# Patient Record
Sex: Female | Born: 1945 | Race: Black or African American | Hispanic: No | Smoking: Former smoker
Health system: Southern US, Community
[De-identification: ages and names within clinical notes are randomized; demographics above are authoritative.]

## PROBLEM LIST (undated history)

## (undated) DIAGNOSIS — R059 Cough, unspecified: Secondary | ICD-10-CM

## (undated) DIAGNOSIS — I517 Cardiomegaly: Secondary | ICD-10-CM

## (undated) DIAGNOSIS — I10 Essential (primary) hypertension: Secondary | ICD-10-CM

## (undated) DIAGNOSIS — M25473 Effusion, unspecified ankle: Secondary | ICD-10-CM

## (undated) DIAGNOSIS — E119 Type 2 diabetes mellitus without complications: Secondary | ICD-10-CM

## (undated) DIAGNOSIS — Z8739 Personal history of other diseases of the musculoskeletal system and connective tissue: Secondary | ICD-10-CM

## (undated) DIAGNOSIS — J45909 Unspecified asthma, uncomplicated: Secondary | ICD-10-CM

## (undated) DIAGNOSIS — I509 Heart failure, unspecified: Secondary | ICD-10-CM

## (undated) DIAGNOSIS — R0602 Shortness of breath: Secondary | ICD-10-CM

## (undated) DIAGNOSIS — R0600 Dyspnea, unspecified: Secondary | ICD-10-CM

## (undated) HISTORY — DX: Cough, unspecified: R05.9

## (undated) HISTORY — DX: Effusion, unspecified ankle: M25.473

## (undated) HISTORY — DX: Personal history of other diseases of the musculoskeletal system and connective tissue: Z87.39

## (undated) HISTORY — DX: Type 2 diabetes mellitus without complications: E11.9

## (undated) HISTORY — DX: Unspecified asthma, uncomplicated: J45.909

## (undated) HISTORY — DX: Shortness of breath: R06.02

## (undated) HISTORY — DX: Cardiomegaly: I51.7

## (undated) HISTORY — DX: Dyspnea, unspecified: R06.00

## (undated) HISTORY — DX: Essential (primary) hypertension: I10

## (undated) HISTORY — DX: Heart failure, unspecified: I50.9

---

## 2020-04-11 DIAGNOSIS — J0100 Acute maxillary sinusitis, unspecified: Secondary | ICD-10-CM | POA: Diagnosis not present

## 2020-04-11 DIAGNOSIS — I10 Essential (primary) hypertension: Secondary | ICD-10-CM | POA: Diagnosis not present

## 2020-04-16 ENCOUNTER — Encounter (HOSPITAL_COMMUNITY): Payer: Self-pay | Admitting: Emergency Medicine

## 2020-04-16 ENCOUNTER — Other Ambulatory Visit: Payer: Self-pay

## 2020-04-16 ENCOUNTER — Emergency Department (HOSPITAL_COMMUNITY)
Admission: EM | Admit: 2020-04-16 | Discharge: 2020-04-16 | Disposition: A | Discharge status: Home / Self Care | Payer: Medicare Other | Attending: Emergency Medicine | Admitting: Emergency Medicine

## 2020-04-16 ENCOUNTER — Emergency Department (HOSPITAL_COMMUNITY): Payer: Medicare Other

## 2020-04-16 DIAGNOSIS — E876 Hypokalemia: Secondary | ICD-10-CM | POA: Insufficient documentation

## 2020-04-16 DIAGNOSIS — R059 Cough, unspecified: Secondary | ICD-10-CM | POA: Diagnosis not present

## 2020-04-16 DIAGNOSIS — R2243 Localized swelling, mass and lump, lower limb, bilateral: Secondary | ICD-10-CM | POA: Diagnosis not present

## 2020-04-16 DIAGNOSIS — J45909 Unspecified asthma, uncomplicated: Secondary | ICD-10-CM | POA: Insufficient documentation

## 2020-04-16 DIAGNOSIS — R0602 Shortness of breath: Secondary | ICD-10-CM | POA: Insufficient documentation

## 2020-04-16 DIAGNOSIS — I10 Essential (primary) hypertension: Secondary | ICD-10-CM | POA: Diagnosis not present

## 2020-04-16 DIAGNOSIS — R002 Palpitations: Secondary | ICD-10-CM | POA: Diagnosis not present

## 2020-04-16 LAB — CBC
HCT: 41.4 % (ref 36.0–46.0)
Hemoglobin: 13.4 g/dL (ref 12.0–15.0)
MCH: 28.2 pg (ref 26.0–34.0)
MCHC: 32.4 g/dL (ref 30.0–36.0)
MCV: 87.2 fL (ref 80.0–100.0)
Platelets: 233 10*3/uL (ref 150–400)
RBC: 4.75 MIL/uL (ref 3.87–5.11)
RDW: 15.8 % — ABNORMAL HIGH (ref 11.5–15.5)
WBC: 7.7 10*3/uL (ref 4.0–10.5)
nRBC: 0 % (ref 0.0–0.2)

## 2020-04-16 LAB — BASIC METABOLIC PANEL
Anion gap: 13 (ref 5–15)
BUN: 15 mg/dL (ref 8–23)
CO2: 23 mmol/L (ref 22–32)
Calcium: 9.9 mg/dL (ref 8.9–10.3)
Chloride: 103 mmol/L (ref 98–111)
Creatinine, Ser: 1.01 mg/dL — ABNORMAL HIGH (ref 0.44–1.00)
GFR, Estimated: 58 mL/min — ABNORMAL LOW (ref >60)
Glucose, Bld: 168 mg/dL — ABNORMAL HIGH (ref 70–99)
Potassium: 3.3 mmol/L — ABNORMAL LOW (ref 3.5–5.1)
Sodium: 139 mmol/L (ref 135–145)

## 2020-04-16 LAB — TROPONIN I (HIGH SENSITIVITY)
Troponin I (High Sensitivity): <2 ng/L (ref <18)
Troponin I (High Sensitivity): <2 ng/L (ref <18)

## 2020-04-16 LAB — MAGNESIUM: Magnesium: 1.9 mg/dL (ref 1.7–2.4)

## 2020-04-16 IMAGING — CR DG CHEST 2V
2 series · 2 of 2 positions shown · non-contrast
Comparison: None.

CLINICAL DATA: Shortness of breath.  History of asthma.

EXAM:
CHEST - 2 VIEW

[chest pa]
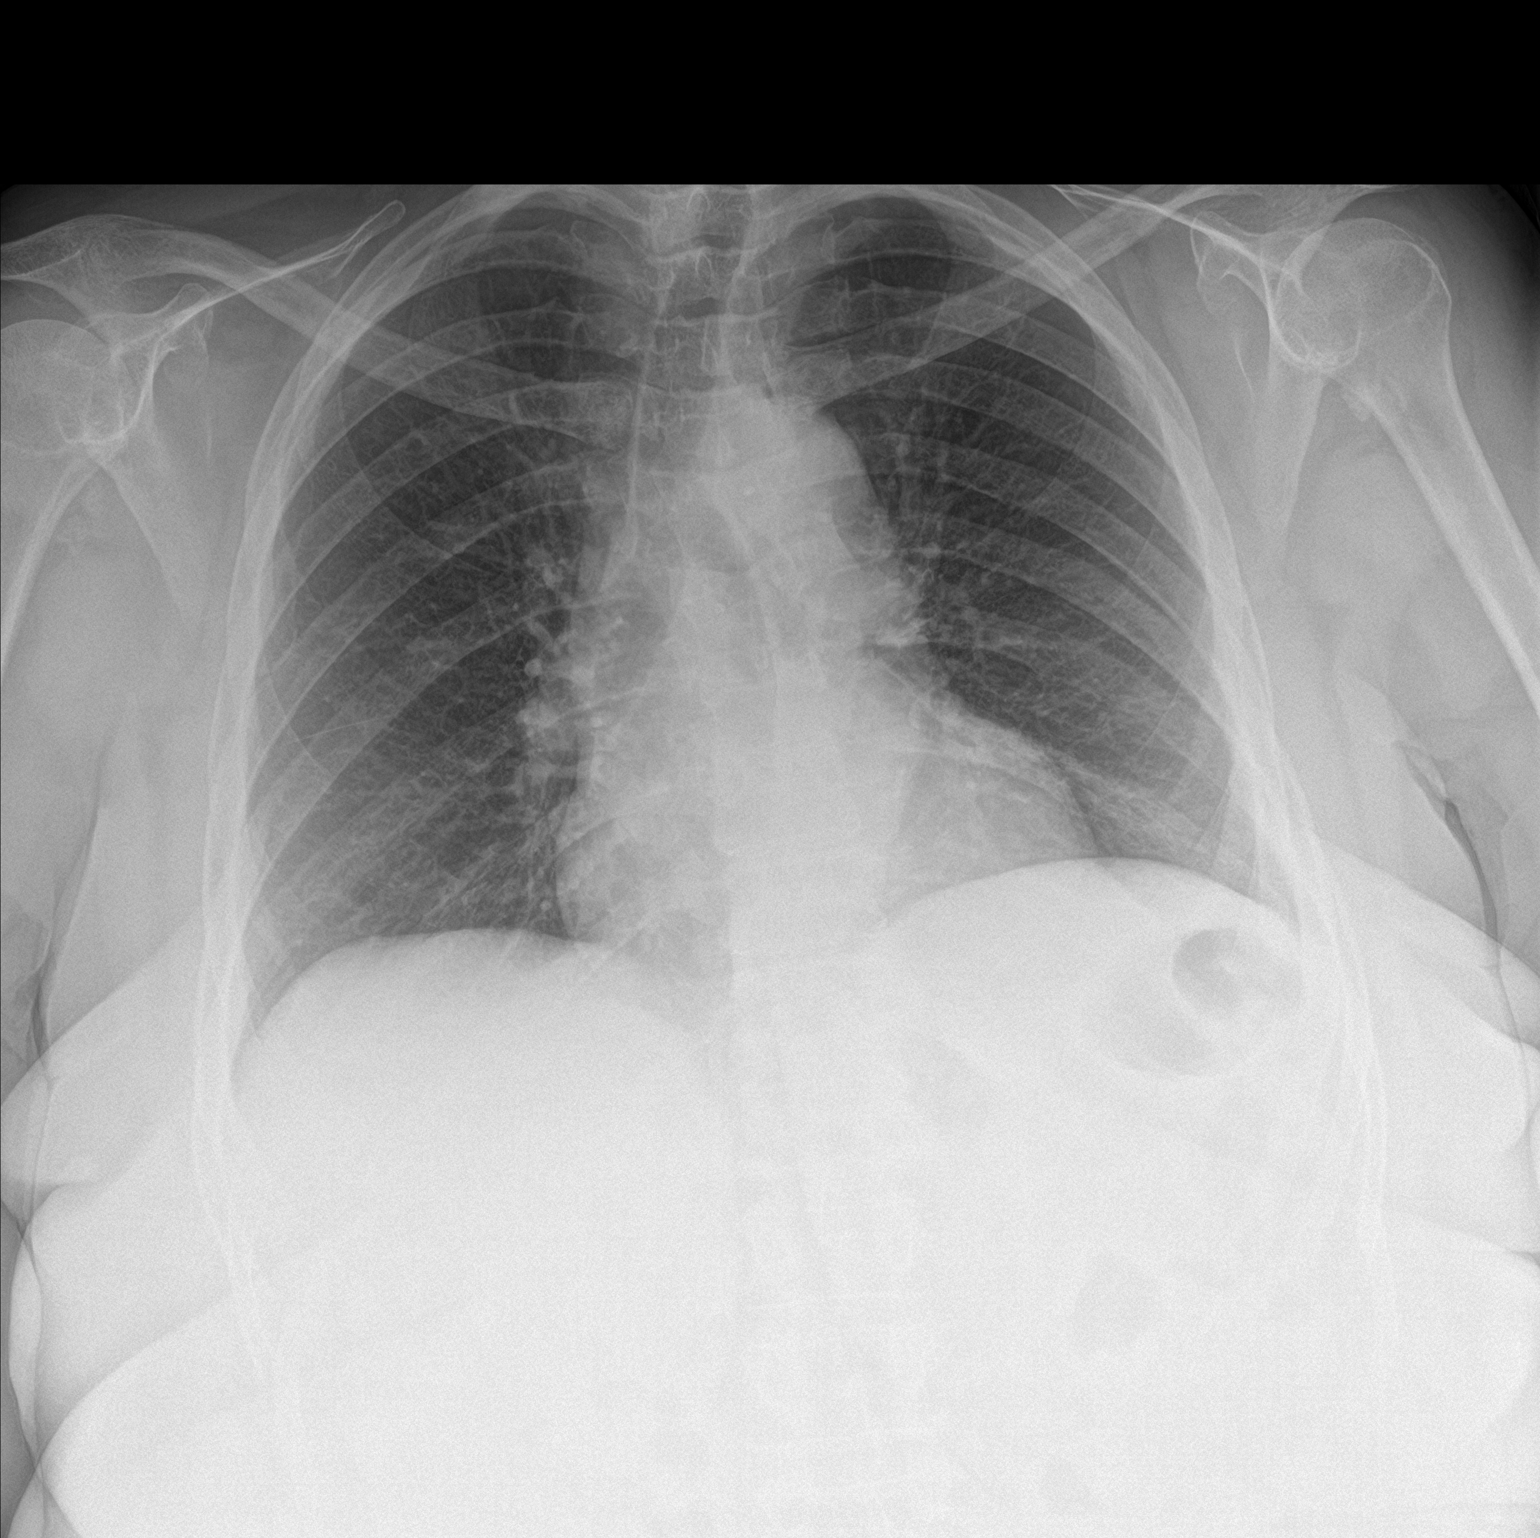

[chest lat]
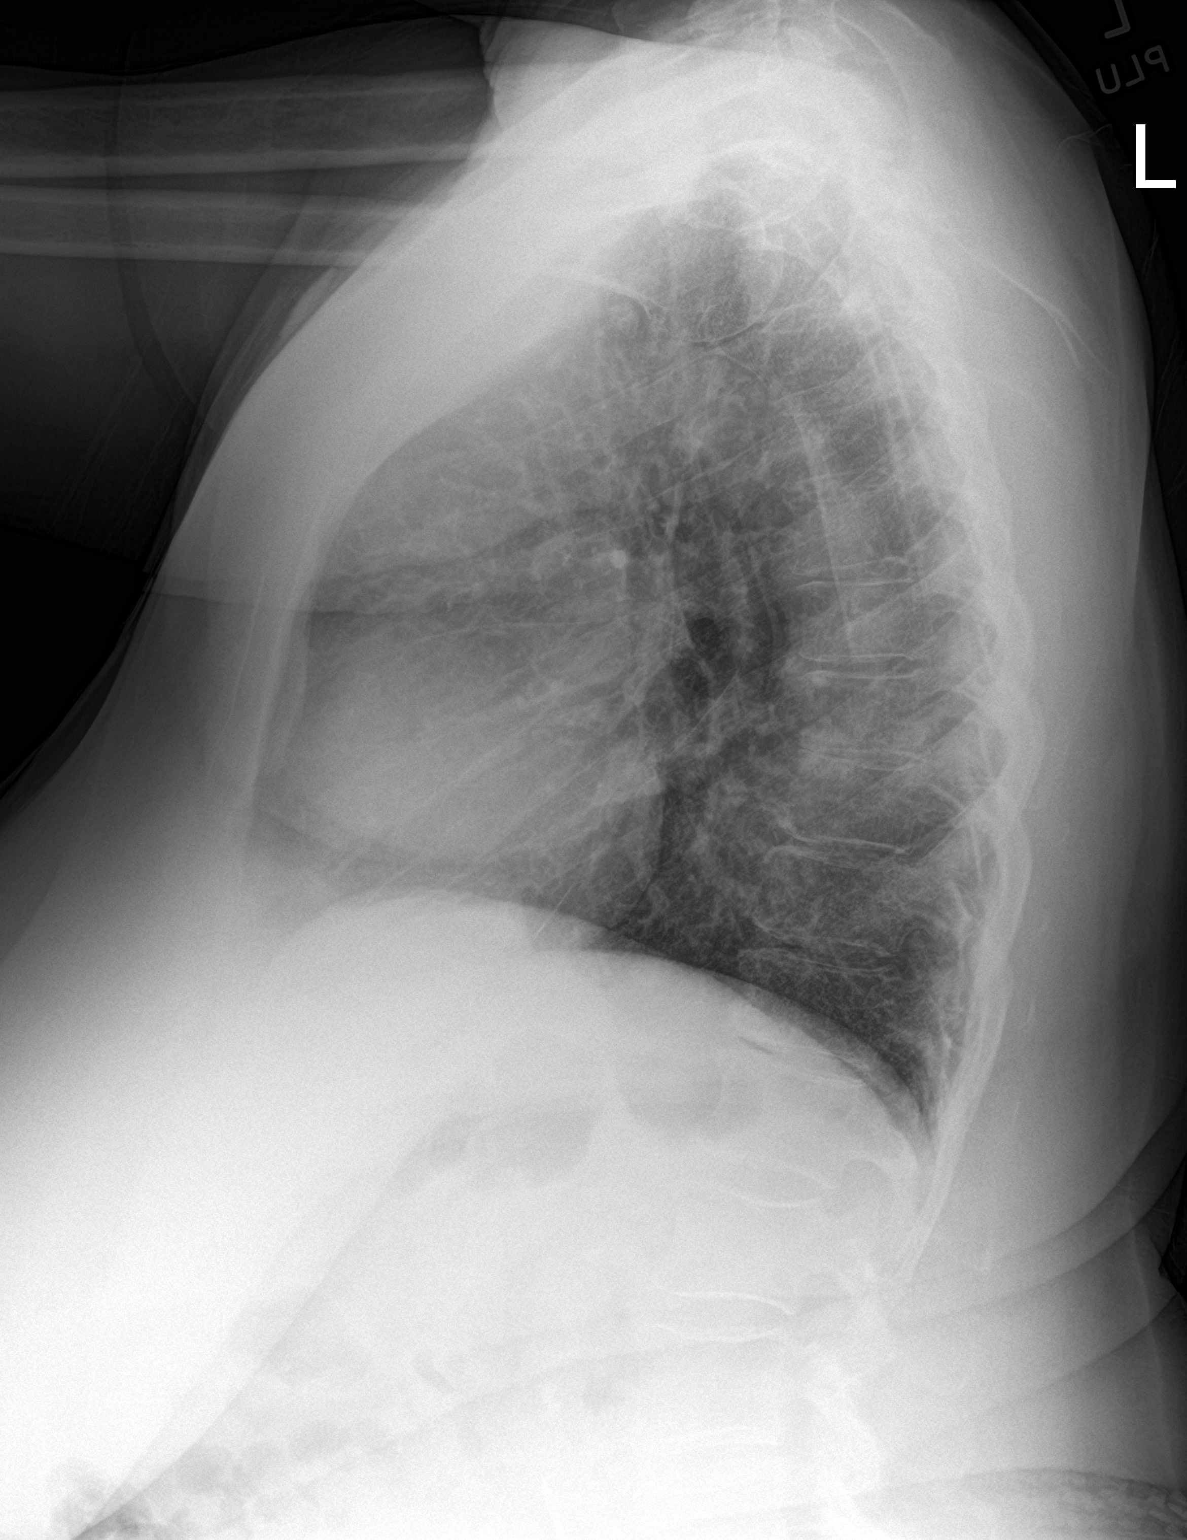

[2 of 2 positions shown; findings below may reference images not displayed]

FINDINGS: Borderline enlarged cardiac silhouette and mediastinal contours. The
thoracic aorta appears mildly tortuous and potentially slightly
ectatic with mild rightward deviation of the tracheal air column at
the level of the aortic arch. There is mild diffuse slightly nodular
thickening of the pulmonary interstitium. No discrete focal airspace
opacities. No pleural effusion or pneumothorax. No evidence of
edema. No acute osseous abnormalities.
IMPRESSION: Findings suggestive of airways disease / bronchitis. No focal
airspace opacities to suggest pneumonia.

## 2020-04-16 MED ORDER — FUROSEMIDE 10 MG/ML IJ SOLN
20.0000 mg | Freq: Once | INTRAMUSCULAR | Status: AC
  Administered 2020-04-16: 20 mg via INTRAVENOUS
  Filled 2020-04-16: qty 2

## 2020-04-16 MED ORDER — POTASSIUM CHLORIDE CRYS ER 20 MEQ PO TBCR
40.0000 meq | EXTENDED_RELEASE_TABLET | Freq: Once | ORAL | Status: AC
  Administered 2020-04-16: 40 meq via ORAL
  Filled 2020-04-16: qty 2

## 2020-04-16 MED ORDER — FUROSEMIDE 20 MG PO TABS: 20.0000 mg | ORAL_TABLET | Freq: Every day | ORAL | 0 refills | Status: DC

## 2020-04-16 NOTE — ED Provider Notes (Signed)
[facility] EMERGENCY DEPARTMENT Provider Note   CSN: [id number] Arrival date & time: 04/16/20  0901     History Chief Complaint  Patient presents with  . Shortness of Breath    Donna Garner is a 74 y.o. female with past medical history significant for asthma, hypertension, borderline diabetes.  HPI Patient presents to emergency department today with chief complaint of progressively worsening shortness of breath x 3 days. Patient states she moved here from [place] x 1 month ago. She has not established care with cardiology or pcp here yet. She states last time she saw cardiology she was told the left side of her heart was big.  Patient states since symptom onset she has felt like her heart is beating fast and like it is going to beat out of her chest.  She states she has had this feeling in the past.  She has been using her albuterol inhaler at home without any symptom improvement.  She admits to hearing wheezing and some chest congestion.  Last use inhaler prior to arrival.  She states her shortness of breath is causing her to not be able to walk as far she usually does. She has productive cough with clear phlegm. She is out of breath with easy activities. She has noticed swelling in her feet and ankles. She has been eating out more lately since moving here, typically tries to watch her salt intake.    Denies history of CVA or MI, COPD, CHF, anemia.     History reviewed. No pertinent past medical history.  There are no problems to display for this patient.   History reviewed. No pertinent surgical history.   OB History   No obstetric history on file.     No family history on file.  Social History   Tobacco Use  . Smoking status: Not on file  Substance Use Topics  . Alcohol use: Not on file  . Drug use: Not on file    Home Medications Prior to Admission medications   Medication Sig Start Date End Date Taking? Authorizing Provider  furosemide  (LASIX) 20 MG tablet Take 1 tablet (20 mg total) by mouth daily for 5 days. 04/17/20 04/22/20  Shanon Ace, PA-C    Allergies    Patient has no allergy information on record.  Review of Systems   Review of Systems  All other systems are reviewed and are negative for acute change except as noted in the HPI.   Physical Exam Updated Vital Signs BP 125/65 (BP Location: Left Arm)   Pulse 66   Temp 98.8 F (37.1 C) (Oral)   Resp 18   Ht 5\' 7"  (1.702 m)   Wt 94.8 kg   SpO2 100%   BMI 32.73 kg/m   Physical Exam Vitals and nursing note reviewed.  Constitutional:      General: She is not in acute distress.    Appearance: She is not ill-appearing.  HENT:     Head: Normocephalic and atraumatic.     Right Ear: Tympanic membrane and external ear normal.     Left Ear: Tympanic membrane and external ear normal.     Nose: Nose normal.     Mouth/Throat:     Mouth: Mucous membranes are moist.     Pharynx: Oropharynx is clear.  Eyes:     General: No scleral icterus.       Right eye: No discharge.        Left eye: No  discharge.     Extraocular Movements: Extraocular movements intact.     Conjunctiva/sclera: Conjunctivae normal.     Pupils: Pupils are equal, round, and reactive to light.  Neck:     Vascular: No JVD.  Cardiovascular:     Rate and Rhythm: Normal rate and regular rhythm.     Pulses: Normal pulses.          Radial pulses are 2+ on the right side and 2+ on the left side.       Dorsalis pedis pulses are 2+ on the right side and 2+ on the left side.     Heart sounds: Normal heart sounds.  Pulmonary:     Comments: Lungs clear to auscultation in all fields. Symmetric chest rise. No wheezing, rales, or rhonchi. Oxygen saturation is 100% on room air. Abdominal:     Comments: Abdomen is soft, non-distended, and non-tender in all quadrants. No rigidity, no guarding. No peritoneal signs.  Musculoskeletal:        General: Normal range of motion.     Cervical back:  Normal range of motion.     Right lower leg: No edema.     Left lower leg: No edema.  Skin:    General: Skin is warm and dry.     Capillary Refill: Capillary refill takes less than 2 seconds.     Comments: Equal tactile temperature to all extremities  Neurological:     Mental Status: She is oriented to person, place, and time.     GCS: GCS eye subscore is 4. GCS verbal subscore is 5. GCS motor subscore is 6.     Comments: Fluent speech, no facial droop.  Psychiatric:        Behavior: Behavior normal.     ED Results / Procedures / Treatments   Labs (all labs ordered are listed, but only abnormal results are displayed) Labs Reviewed  BASIC METABOLIC PANEL - Abnormal; Notable for the following components:      Result Value   Potassium 3.3 (*)    Glucose, Bld 168 (*)    Creatinine, Ser 1.01 (*)    GFR, Estimated 58 (*)    All other components within normal limits  CBC - Abnormal; Notable for the following components:   RDW 15.8 (*)    All other components within normal limits  MAGNESIUM  TROPONIN I (HIGH SENSITIVITY)  TROPONIN I (HIGH SENSITIVITY)    EKG EKG Interpretation  Date/Time:  Monday April 16 2020 09:17:24 EST Ventricular Rate:  82 PR Interval:  150 QRS Duration: 92 QT Interval:  384 QTC Calculation: 448 R Axis:   -54 Text Interpretation: Normal sinus rhythm Left anterior fascicular block Abnormal ECG Confirmed by Margarita Grizzle [phone]) on 04/16/2020 11:09:13 AM   Radiology DG Chest 2 View  Result Date: 04/16/2020 CLINICAL DATA:  Shortness of breath.  History of asthma. EXAM: CHEST - 2 VIEW COMPARISON:  None. FINDINGS: Borderline enlarged cardiac silhouette and mediastinal contours. The thoracic aorta appears mildly tortuous and potentially slightly ectatic with mild rightward deviation of the tracheal air column at the level of the aortic arch. There is mild diffuse slightly nodular thickening of the pulmonary interstitium. No discrete focal airspace  opacities. No pleural effusion or pneumothorax. No evidence of edema. No acute osseous abnormalities. IMPRESSION: Findings suggestive of airways disease / bronchitis. No focal airspace opacities to suggest pneumonia. Electronically Signed   By: Simonne Come M.D.   On: 04/16/2020 09:59    Procedures Procedures (including  critical care time)  Medications Ordered in ED Medications  furosemide (LASIX) injection 20 mg (20 mg Intravenous Given 04/16/20 1154)  potassium chloride SA (KLOR-CON) CR tablet 40 mEq (40 mEq Oral Given 04/16/20 1155)    ED Course  I have reviewed the triage vital signs and the nursing notes.  Pertinent labs & imaging results that were available during my care of the patient were reviewed by me and considered in my medical decision making (see chart for details).    MDM Rules/Calculators/A&P                          History provided by patient with additional history obtained from chart review.    74 yo female presenting with shortness of breath. She is well appearing, in no acute distress.  She is afebrile, HDS.  On exam lungs are clear to auscultation in all fields.  She has normal work of breathing. Mild bilateral lower extremity edema 1 +. No calf tenderness.  I viewed pt's chest xray and it does not suggest acute infectious processes. Radiologist does comment on findings to suggest airway disease/bronchitis.  CBC without leukocytosis or anemia. BMP with mild hypokalemia at 3.3, no other significant electrolyte derangement.  Creatinine is 1.01, slightly bumped with a normal BUN, normal anion gap. Delta troponin undetectable. EKG without obvious ischemia.  Suspect there is component of CHF causing patient's symptoms.  Will give IV Lasix.    Reassessed patient after IV Lasix.  She has ambulated to the bathroom at least twice without any difficulty.  Her work of breathing has improved.  She ambulated without hypoxia.  Engaged in shared decision making with patient and  daughter at the bedside. They feel symptoms can be managed at home. Patient stable to be discharged home in ambulatory referral has been sent to cardiology outpatient has not yet been able to establish care since moving to [facility]. Will discharge with short course of PO lasix.  The patient appears reasonably screened and/or stabilized for discharge and I doubt any other medical condition or other [facility] requiring further screening, evaluation, or treatment in the ED at this time prior to discharge. The patient is safe for discharge with strict return precautions discussed. The patient was discussed with and seen by Dr. Rosalia Hammers who agrees with the treatment plan.  Portions of this note were generated with Scientist, clinical (histocompatibility and immunogenetics). Dictation errors may occur despite best attempts at proofreading.   Final Clinical Impression(s) / ED Diagnoses Final diagnoses:  Shortness of breath    Rx / DC Orders ED Discharge Orders         Ordered    furosemide (LASIX) 20 MG tablet  Daily        04/16/20 1344    Ambulatory referral to Cardiology        04/16/20 1315           Shanon Ace, PA-C 04/16/20 1346    Margarita Grizzle, MD 04/18/20 1304

## 2020-04-16 NOTE — ED Triage Notes (Signed)
Pt here from home with c/o sob ,pt does have asthma and had to use her re sue inhaler this morning , pt recently had some b/p med dose changes made

## 2020-04-16 NOTE — Discharge Instructions (Addendum)
Prescription sent to your pharmacy for Lasix.  This is a fluid pill that helps get excess fluid off the body.  You will probably have to pee more often when taking this and that is normal.  Referral sent to a local cardiology office.  They should contact you within 1 week to schedule a follow-up appointment.  If you do not hear from the office you can call the number listed in your discharge paperwork.  Information has been included in your discharge paperwork about heart failure and healthy eating.  This is to give you more information about the condition, you have not formally been diagnosed with heart failure today.  It is important that you elevate your legs when you see swelling.  Return to the emergency department for any new or worsening symptoms.

## 2020-04-30 DIAGNOSIS — I10 Essential (primary) hypertension: Secondary | ICD-10-CM | POA: Diagnosis not present

## 2020-04-30 DIAGNOSIS — E785 Hyperlipidemia, unspecified: Secondary | ICD-10-CM | POA: Diagnosis not present

## 2020-04-30 DIAGNOSIS — B1920 Unspecified viral hepatitis C without hepatic coma: Secondary | ICD-10-CM | POA: Diagnosis not present

## 2020-04-30 DIAGNOSIS — Z1159 Encounter for screening for other viral diseases: Secondary | ICD-10-CM | POA: Diagnosis not present

## 2020-04-30 DIAGNOSIS — E559 Vitamin D deficiency, unspecified: Secondary | ICD-10-CM | POA: Diagnosis not present

## 2020-04-30 DIAGNOSIS — E1169 Type 2 diabetes mellitus with other specified complication: Secondary | ICD-10-CM | POA: Diagnosis not present

## 2020-05-08 ENCOUNTER — Encounter: Payer: Self-pay | Admitting: Internal Medicine

## 2020-05-08 ENCOUNTER — Ambulatory Visit (INDEPENDENT_AMBULATORY_CARE_PROVIDER_SITE_OTHER): Payer: Medicare Other | Admitting: Internal Medicine

## 2020-05-08 ENCOUNTER — Other Ambulatory Visit: Payer: Self-pay

## 2020-05-08 VITALS — BP 104/58 | HR 74 | Ht 67.0 in | Wt 214.4 lb

## 2020-05-08 DIAGNOSIS — I5032 Chronic diastolic (congestive) heart failure: Secondary | ICD-10-CM | POA: Insufficient documentation

## 2020-05-08 DIAGNOSIS — I10 Essential (primary) hypertension: Secondary | ICD-10-CM | POA: Diagnosis not present

## 2020-05-08 DIAGNOSIS — E119 Type 2 diabetes mellitus without complications: Secondary | ICD-10-CM

## 2020-05-08 DIAGNOSIS — R079 Chest pain, unspecified: Secondary | ICD-10-CM | POA: Diagnosis not present

## 2020-05-08 MED ORDER — FUROSEMIDE 20 MG PO TABS
20.0000 mg | ORAL_TABLET | Freq: Every day | ORAL | 3 refills | Status: DC
Start: 2020-05-08 — End: 2021-07-02

## 2020-05-08 MED ORDER — ATENOLOL 50 MG PO TABS: 50.0000 mg | ORAL_TABLET | Freq: Every day | ORAL | 3 refills | Status: DC

## 2020-05-08 NOTE — Progress Notes (Signed)
Cardiology Office Note:    Date:  05/08/2020   ID:  Donna Garner, DOB 16-Feb-1946, MRN [id number]  PCP:  Shon Hale, MD  [facility] Cardiologist:  Christell Constant, MD  [facility] Electrophysiologist:  None   CC: DOE Consulted for the evaluation of HF at the behest of Shon Hale, MD  History of Present Illness:    Donna Garner is a 74 y.o. female with a hx of Diabetes with HTN, Asthma, with history of HFpEF and LVH in [place] (Dr. Welton Flakes [facility] [place]).    Patient had recent 04/16/20 eval for HF exacerbation and improved with IV lasix.  Started on lasix 20 mg PO. Daily for five days only  Patient notes that she was seen in [place]:  Found to have LVH and possible HFpEF.  Had normal LHC in 2019.  Moved to [facility] in September.  Noted some shortness of breath.  This continued to get worse culminating in ED eval.  This improved with PO lasix, but only had 4 days worse. Notes persistent weight gain since moved her (209).    Notes chest tightness in her sternum that is worse with exercise and improves with rest. Sometimes changes with reflux pull.  No chang since 2019 heart catheterization.  No syncope or near syncope.  Cant lay flat because of back pain.  Notes leg swelling, but no abdominal swelling.  Weight has increase from September (5 lb weight gain).  Ambulatory BP : 131/86  Past Medical History:  Diagnosis Date  . Asthma   . Cardiomegaly   . CHF (congestive heart failure) (HCC)   . Cough   . Diabetes (HCC)   . Dyspnea   . History of swelling of feet   . Hypertension   . SOB (shortness of breath)   . Swelling of ankle     No past surgical history on file.  Current Medications: Current Meds  Medication Sig  . amLODipine (NORVASC) 10 MG tablet Take 10 mg by mouth daily.  Marland Kitchen aspirin EC 81 MG tablet Take 81 mg by mouth daily. Swallow whole.  . calcium-vitamin D (OSCAL WITH D) 500-200 MG-UNIT tablet Take 1 tablet by  mouth.  . furosemide (LASIX) 20 MG tablet Take 1 tablet (20 mg total) by mouth daily.  Marland Kitchen guaiFENesin (MUCINEX) 600 MG 12 hr tablet Take by mouth 2 (two) times daily as needed for cough or to loosen phlegm.  Marland Kitchen losartan (COZAAR) 100 MG tablet Take 100 mg by mouth daily.  . mometasone-formoterol (DULERA) 100-5 MCG/ACT AERO Inhale 2 puffs into the lungs 2 (two) times daily.  . montelukast (SINGULAIR) 10 MG tablet Take 10 mg by mouth daily.  . pantoprazole (PROTONIX) 40 MG tablet Take 40 mg by mouth daily.  . potassium chloride SA (KLOR-CON) 20 MEQ tablet Take 20 mEq by mouth 2 (two) times daily.  . rosuvastatin (CRESTOR) 10 MG tablet Take 10 mg by mouth daily.  . [DISCONTINUED] atenolol-chlorthalidone (TENORETIC) 50-25 MG tablet Take 1 tablet by mouth daily.  . [DISCONTINUED] furosemide (LASIX) 20 MG tablet Take 1 tablet (20 mg total) by mouth daily for 5 days.    Allergies:   Ibuprofen, Glipizide, Hydroxychloroquine, Metformin and related, Saline nasal spray, and Zyrtec [cetirizine]   Social History   Socioeconomic History  . Marital status: Legally Separated    Spouse name: Not on file  . Number of children: Not on file  . Years of education: Not on file  .  Highest education level: Not on file  Occupational History  . Not on file  Tobacco Use  . Smoking status: Former Games developer  . Smokeless tobacco: Never Used  Substance and Sexual Activity  . Alcohol use: Never  . Drug use: Never  . Sexual activity: Not on file  Other Topics Concern  . Not on file  Social History Narrative  . Not on file   Social Determinants of Health   Financial Resource Strain:   . Difficulty of Paying Living Expenses: Not on file  Food Insecurity:   . Worried About Programme researcher, broadcasting/film/video in the Last Year: Not on file  . Ran Out of Food in the Last Year: Not on file  Transportation Needs:   . Lack of Transportation (Medical): Not on file  . Lack of Transportation (Non-Medical): Not on file  Physical  Activity:   . Days of Exercise per Week: Not on file  . Minutes of Exercise per Session: Not on file  Stress:   . Feeling of Stress : Not on file  Social Connections:   . Frequency of Communication with Friends and Family: Not on file  . Frequency of Social Gatherings with Friends and Family: Not on file  . Attends Religious Services: Not on file  . Active Member of Clubs or Organizations: Not on file  . Attends Banker Meetings: Not on file  . Marital Status: Not on file     Family History: The patient's brother and sister died from heart disease NOS ROS:   Please see the history of present illness.    All other systems reviewed and are negative.  EKGs/Labs/Other Studies Reviewed:    The following studies were reviewed today:  EKG:   04/17/20:  SR rate 82 no ST/T changes  Recent Labs: 04/16/2020: BUN 15; Creatinine, Ser 1.01; Hemoglobin 13.4; Magnesium 1.9; Platelets 233; Potassium 3.3; Sodium 139  Recent Lipid Panel No results found for: CHOL, TRIG, HDL, CHOLHDL, VLDL, LDLCALC, LDLDIRECT   Risk Assessment/Calculations:     None  Physical Exam:    VS:  BP (!) 104/58   Pulse 74   Ht 5\' 7"  (1.702 m)   Wt 214 lb 6.4 oz (97.3 kg)   SpO2 98%   BMI 33.58 kg/m     Wt Readings from Last 3 Encounters:  05/08/20 214 lb 6.4 oz (97.3 kg)  04/16/20 209 lb (94.8 kg)    GEN: Well nourished, well developed in no acute distress HEENT: Normal NECK: No JVD; No carotid bruits LYMPHATICS: No lymphadenopathy CARDIAC: RRR, no murmurs, rubs, gallops RESPIRATORY:  Clear to auscultation without rales, wheezing or rhonchi  ABDOMEN: Soft, non-tender, non-distended MUSCULOSKELETAL:  No edema; No deformity  SKIN: Warm and dry NEUROLOGIC:  Alert and oriented x 3 PSYCHIATRIC:  Normal affect   ASSESSMENT:    1. Chronic heart failure with preserved ejection fraction (HCC)   2. Diabetes mellitus with coincident hypertension (HCC)   3. Chest pain of uncertain etiology     PLAN:    In order of problems listed above:  Diabetes with HTN Heart Failure with presevered EF - NYHA class II, Stage B, slightly hypervolemic, etiology from LVH - lasix 20 mg PO daily - will switch to just atenolol 50 mg - continue losartan 100 mg daily - continue ASA 81, norvasc 10 mg continue rosuvastatin 10 mg - will check BNP and BMP today, BMP Mg in 7-10 days; will manage K based on this - Strict I/Os, daily  weights, and fluid restriction of < 2 L  - get echocardiogram - will start amb BP checks   [facility] (echo and heart catheterization).  Chest pain - given recent prior heart cath; will get old records prior stress testing.  2 months follow up unless new symptoms or abnormal test results warranting change in plan  Would be reasonable for  APP Follow up   Shared Decision Making/Informed Consent     Medication Adjustments/Labs and Tests Ordered: Current medicines are reviewed at length with the patient today.  Concerns regarding medicines are outlined above.  Orders Placed This Encounter  Procedures  . Basic metabolic panel  . Pro b natriuretic peptide (BNP)  . Basic metabolic panel  . Magnesium  . ECHOCARDIOGRAM COMPLETE   Meds ordered this encounter  Medications  . furosemide (LASIX) 20 MG tablet    Sig: Take 1 tablet (20 mg total) by mouth daily.    Dispense:  90 tablet    Refill:  3  . atenolol (TENORMIN) 50 MG tablet    Sig: Take 1 tablet (50 mg total) by mouth daily.    Dispense:  90 tablet    Refill:  3    Patient Instructions  Medication Instructions:  Your physician has recommended you make the following change in your medication:   1. STOP: atenolol-chlorthalidone (tenoretic)  2. START: atenolol 50 mg tablet: Take 1 tablet by mouth once a day  3. CONTINUE: furosemide (lasix) 20 mg tablet: Take 1 tablet by mouth once a day  *If you need a refill on your cardiac medications before your  next appointment, please call your pharmacy*   Lab Work: TODAY: BMET, BNP  Your physician recommends that you return for lab work in: 7-10 days for BMET and MG  If you have labs (blood work) drawn today and your tests are completely normal, you will receive your results only by: Marland Kitchen. MyChart Message (if you have MyChart) OR . A paper copy in the mail If you have any lab test that is abnormal or we need to change your treatment, we will call you to review the results.   Testing/Procedures: Your physician has requested that you have an echocardiogram. Echocardiography is a painless test that uses sound waves to create images of your heart. It provides your doctor with information about the size and shape of your heart and how well your heart's chambers and valves are working. This procedure takes approximately one hour. There are no restrictions for this procedure.   Follow-Up: At [facility], you and your health needs are our priority.  As part of our continuing mission to provide you with exceptional heart care, we have created designated Provider Care Teams.  These Care Teams include your primary Cardiologist (physician) and Advanced Practice Providers (APPs -  Physician Assistants and Nurse Practitioners) who all work together to provide you with the care you need, when you need it.  We recommend signing up for the patient portal called "MyChart".  Sign up information is provided on this After Visit Summary.  MyChart is used to connect with patients for Virtual Visits (Telemedicine).  Patients are able to view lab/test results, encounter notes, upcoming appointments, etc.  Non-urgent messages can be sent to your provider as well.   To learn more about what you can do with MyChart, go to [email or web address].    Your next appointment:   2 month(s)  The format for your next  appointment:   In Person  Provider:   Riley Lam, MD   Other Instructions None      Signed, Christell Constant, MD  05/08/2020 9:30 AM    [facility]

## 2020-05-08 NOTE — Patient Instructions (Signed)
Medication Instructions:  Your physician has recommended you make the following change in your medication:   1. STOP: atenolol-chlorthalidone (tenoretic)  2. START: atenolol 50 mg tablet: Take 1 tablet by mouth once a day  3. CONTINUE: furosemide (lasix) 20 mg tablet: Take 1 tablet by mouth once a day  *If you need a refill on your cardiac medications before your next appointment, please call your pharmacy*   Lab Work: TODAY: BMET, BNP  Your physician recommends that you return for lab work in: 7-10 days for BMET and MG  If you have labs (blood work) drawn today and your tests are completely normal, you will receive your results only by: Marland Kitchen MyChart Message (if you have MyChart) OR . A paper copy in the mail If you have any lab test that is abnormal or we need to change your treatment, we will call you to review the results.   Testing/Procedures: Your physician has requested that you have an echocardiogram. Echocardiography is a painless test that uses sound waves to create images of your heart. It provides your doctor with information about the size and shape of your heart and how well your heart's chambers and valves are working. This procedure takes approximately one hour. There are no restrictions for this procedure.   Follow-Up: At [facility], you and your health needs are our priority.  As part of our continuing mission to provide you with exceptional heart care, we have created designated Provider Care Teams.  These Care Teams include your primary Cardiologist (physician) and Advanced Practice Providers (APPs -  Physician Assistants and Nurse Practitioners) who all work together to provide you with the care you need, when you need it.  We recommend signing up for the patient portal called "MyChart".  Sign up information is provided on this After Visit Summary.  MyChart is used to connect with patients for Virtual Visits (Telemedicine).  Patients are able to view lab/test  results, encounter notes, upcoming appointments, etc.  Non-urgent messages can be sent to your provider as well.   To learn more about what you can do with MyChart, go to [email or web address].    Your next appointment:   2 month(s)  The format for your next appointment:   In Person  Provider:   Riley Lam, MD   Other Instructions None

## 2020-05-09 LAB — BASIC METABOLIC PANEL
BUN/Creatinine Ratio: 19 (ref 12–28)
BUN: 19 mg/dL (ref 8–27)
CO2: 21 mmol/L (ref 20–29)
Calcium: 9.9 mg/dL (ref 8.7–10.3)
Chloride: 100 mmol/L (ref 96–106)
Creatinine, Ser: 0.99 mg/dL (ref 0.57–1.00)
GFR calc Af Amer: 65 mL/min/{1.73_m2} (ref >59)
GFR calc non Af Amer: 56 mL/min/{1.73_m2} — ABNORMAL LOW (ref >59)
Glucose: 154 mg/dL — ABNORMAL HIGH (ref 65–99)
Potassium: 3.7 mmol/L (ref 3.5–5.2)
Sodium: 136 mmol/L (ref 134–144)

## 2020-05-09 LAB — PRO B NATRIURETIC PEPTIDE: NT-Pro BNP: 41 pg/mL (ref 0–301)

## 2020-05-15 ENCOUNTER — Other Ambulatory Visit: Payer: Self-pay

## 2020-05-15 ENCOUNTER — Other Ambulatory Visit: Payer: Medicare Other | Admitting: *Deleted

## 2020-05-15 DIAGNOSIS — R079 Chest pain, unspecified: Secondary | ICD-10-CM | POA: Diagnosis not present

## 2020-05-15 DIAGNOSIS — E119 Type 2 diabetes mellitus without complications: Secondary | ICD-10-CM | POA: Diagnosis not present

## 2020-05-15 DIAGNOSIS — I10 Essential (primary) hypertension: Secondary | ICD-10-CM

## 2020-05-15 DIAGNOSIS — I5032 Chronic diastolic (congestive) heart failure: Secondary | ICD-10-CM

## 2020-05-15 LAB — MAGNESIUM: Magnesium: 2.1 mg/dL (ref 1.6–2.3)

## 2020-05-16 ENCOUNTER — Telehealth: Payer: Self-pay

## 2020-05-16 LAB — BASIC METABOLIC PANEL
BUN/Creatinine Ratio: 24 (ref 12–28)
BUN: 19 mg/dL (ref 8–27)
CO2: 26 mmol/L (ref 20–29)
Calcium: 10 mg/dL (ref 8.7–10.3)
Chloride: 109 mmol/L — ABNORMAL HIGH (ref 96–106)
Creatinine, Ser: 0.8 mg/dL (ref 0.57–1.00)
GFR calc Af Amer: 84 mL/min/{1.73_m2} (ref >59)
GFR calc non Af Amer: 73 mL/min/{1.73_m2} (ref >59)
Glucose: 149 mg/dL — ABNORMAL HIGH (ref 65–99)
Potassium: 3.8 mmol/L (ref 3.5–5.2)
Sodium: 144 mmol/L (ref 134–144)

## 2020-05-16 NOTE — Telephone Encounter (Signed)
-----   Message from Christell Constant, MD sent at 05/16/2020 11:40 AM EST ----- Results: Stable K and Creatinine on present Potassium and Lasix therapy2 Plan: Continue current plan  Christell Constant, MD

## 2020-05-16 NOTE — Telephone Encounter (Signed)
The patient has been notified of the result and verbalized understanding.  All questions (if any) were answered. Leanord Hawking, RN 05/16/2020 12:25 PM

## 2020-05-21 DIAGNOSIS — M1711 Unilateral primary osteoarthritis, right knee: Secondary | ICD-10-CM | POA: Diagnosis not present

## 2020-05-21 DIAGNOSIS — M545 Low back pain, unspecified: Secondary | ICD-10-CM | POA: Diagnosis not present

## 2020-05-30 DIAGNOSIS — G5603 Carpal tunnel syndrome, bilateral upper limbs: Secondary | ICD-10-CM | POA: Diagnosis not present

## 2020-05-30 DIAGNOSIS — M79642 Pain in left hand: Secondary | ICD-10-CM | POA: Diagnosis not present

## 2020-05-30 DIAGNOSIS — M79641 Pain in right hand: Secondary | ICD-10-CM | POA: Diagnosis not present

## 2020-06-05 ENCOUNTER — Other Ambulatory Visit (HOSPITAL_COMMUNITY): Payer: Medicare Other

## 2020-06-05 ENCOUNTER — Other Ambulatory Visit: Payer: Self-pay

## 2020-06-05 ENCOUNTER — Ambulatory Visit (HOSPITAL_COMMUNITY)
Admission: RE | Admit: 2020-06-05 | Discharge: 2020-06-05 | Disposition: A | Discharge status: Home / Self Care | Payer: Medicare Other | Source: Ambulatory Visit | Attending: Internal Medicine | Admitting: Internal Medicine

## 2020-06-05 DIAGNOSIS — I10 Essential (primary) hypertension: Secondary | ICD-10-CM | POA: Diagnosis not present

## 2020-06-05 DIAGNOSIS — I700 Atherosclerosis of aorta: Secondary | ICD-10-CM | POA: Diagnosis not present

## 2020-06-05 DIAGNOSIS — I110 Hypertensive heart disease with heart failure: Secondary | ICD-10-CM | POA: Insufficient documentation

## 2020-06-05 DIAGNOSIS — E119 Type 2 diabetes mellitus without complications: Secondary | ICD-10-CM | POA: Insufficient documentation

## 2020-06-05 DIAGNOSIS — I5032 Chronic diastolic (congestive) heart failure: Secondary | ICD-10-CM | POA: Diagnosis not present

## 2020-06-05 DIAGNOSIS — R079 Chest pain, unspecified: Secondary | ICD-10-CM | POA: Insufficient documentation

## 2020-06-05 DIAGNOSIS — I088 Other rheumatic multiple valve diseases: Secondary | ICD-10-CM | POA: Insufficient documentation

## 2020-06-05 LAB — ECHOCARDIOGRAM COMPLETE
Area-P 1/2: 4.21 cm2
S' Lateral: 3.1 cm

## 2020-06-05 NOTE — Progress Notes (Signed)
  Echocardiogram 2D Echocardiogram with 3D has been performed.  Donna Garner 06/05/2020, 9:11 AM

## 2020-06-22 DIAGNOSIS — M5459 Other low back pain: Secondary | ICD-10-CM | POA: Diagnosis not present

## 2020-06-28 DIAGNOSIS — J0100 Acute maxillary sinusitis, unspecified: Secondary | ICD-10-CM | POA: Diagnosis not present

## 2020-06-29 DIAGNOSIS — R059 Cough, unspecified: Secondary | ICD-10-CM | POA: Diagnosis not present

## 2020-07-10 DIAGNOSIS — M5416 Radiculopathy, lumbar region: Secondary | ICD-10-CM | POA: Diagnosis not present

## 2020-07-16 ENCOUNTER — Encounter: Payer: Self-pay | Admitting: Internal Medicine

## 2020-07-16 ENCOUNTER — Other Ambulatory Visit: Payer: Self-pay

## 2020-07-16 ENCOUNTER — Ambulatory Visit (INDEPENDENT_AMBULATORY_CARE_PROVIDER_SITE_OTHER): Payer: Medicare Other | Admitting: Internal Medicine

## 2020-07-16 VITALS — BP 122/74 | HR 76 | Ht 67.0 in | Wt 216.0 lb

## 2020-07-16 DIAGNOSIS — I739 Peripheral vascular disease, unspecified: Secondary | ICD-10-CM | POA: Insufficient documentation

## 2020-07-16 DIAGNOSIS — I10 Essential (primary) hypertension: Secondary | ICD-10-CM

## 2020-07-16 DIAGNOSIS — I5032 Chronic diastolic (congestive) heart failure: Secondary | ICD-10-CM

## 2020-07-16 DIAGNOSIS — E119 Type 2 diabetes mellitus without complications: Secondary | ICD-10-CM | POA: Diagnosis not present

## 2020-07-16 NOTE — Patient Instructions (Signed)
Medication Instructions:  Your physician has recommended you make the following change in your medication:   STOP: amlodipine (Norvasc)  *If you need a refill on your cardiac medications before your next appointment, please call your pharmacy*   Lab Work: NONE If you have labs (blood work) drawn today and your tests are completely normal, you will receive your results only by: Marland Kitchen MyChart Message (if you have MyChart) OR . A paper copy in the mail If you have any lab test that is abnormal or we need to change your treatment, we will call you to review the results.   Testing/Procedures: Your physician has requested that you have a lower  extremity arterial duplex. This test is an ultrasound of the arteries in the legs. It looks at arterial blood flow in the legs. Allow one hour for Lower and Upper Arterial scans. There are no restrictions or special instructions    Follow-Up: At [facility], you and your health needs are our priority.  As part of our continuing mission to provide you with exceptional heart care, we have created designated Provider Care Teams.  These Care Teams include your primary Cardiologist (physician) and Advanced Practice Providers (APPs -  Physician Assistants and Nurse Practitioners) who all work together to provide you with the care you need, when you need it.  We recommend signing up for the patient portal called "MyChart".  Sign up information is provided on this After Visit Summary.  MyChart is used to connect with patients for Virtual Visits (Telemedicine).  Patients are able to view lab/test results, encounter notes, upcoming appointments, etc.  Non-urgent messages can be sent to your provider as well.   To learn more about what you can do with MyChart, go to [email or web address].    Your next appointment:   3-4 months  The format for your next appointment:   In Person  Provider:   You may see Christell Constant, MD or one of the following  Advanced Practice Providers on your designated Care Team:    Ronie Spies, PA-C  Jacolyn Reedy, PA-C    Other Instructions Your physician has requested that you regularly monitor and record your blood pressure readings at home. Please use the same machine at the same time of day to check your readings and record them to bring to your follow-up visit. Please notify office if BP readings are high.

## 2020-07-16 NOTE — Progress Notes (Signed)
Cardiology Office Note:    Date:  07/16/2020   ID:  Donna Garner, DOB 04-04-1946, MRN [id number]  PCP:  Shon Hale, MD  [facility] Cardiologist:  Christell Constant, MD  [facility] Electrophysiologist:  None   CC: Follow up DOE  History of Present Illness:    Donna Garner is a 75 y.o. female with a hx of Diabetes with HTN, Asthma, with history of HFpEF and LVH in [place] (Dr. [facility]) who presented for evaluation 05/08/20.  In interim, we attempted to get records from [facility]- but despite requesting records they were never received.    Patient notes that she is doing pretty good.  Since last visit notes no changes.  Relevant interval testing or therapy include no change in therapy.  There are no* interval hospital/ED visit.    No chest pain or pressure.  No SOB/DOE and no PND/Orthopnea.  Notes that her legs are swelling very quickly if she doesn't use her compression stockings.  Only takes 5-10 minutes.  Had leg swelling even before Norvasc, but it was not as bad.  Note bilateral leg pain worse with ambulation and improved with rest, no rest pain.  No palpitations or syncope.  Ambulatory blood pressure not done.   Past Medical History:  Diagnosis Date  . Asthma   . Cardiomegaly   . CHF (congestive heart failure) (HCC)   . Cough   . Diabetes (HCC)   . Dyspnea   . History of swelling of feet   . Hypertension   . SOB (shortness of breath)   . Swelling of ankle     No past surgical history on file.  Current Medications: Current Meds  Medication Sig  . aspirin EC 81 MG tablet Take 81 mg by mouth daily. Swallow whole.  Marland Kitchen atenolol (TENORMIN) 50 MG tablet Take 1 tablet (50 mg total) by mouth daily.  . calcium-vitamin D (OSCAL WITH D) 500-200 MG-UNIT tablet Take 1 tablet by mouth.  . furosemide (LASIX) 20 MG tablet Take 1 tablet (20 mg total) by mouth daily.  Marland Kitchen guaiFENesin (MUCINEX) 600 MG 12 hr tablet Take by mouth 2 (two)  times daily as needed for cough or to loosen phlegm.  Marland Kitchen losartan (COZAAR) 100 MG tablet Take 100 mg by mouth daily.  . mometasone-formoterol (DULERA) 100-5 MCG/ACT AERO Inhale 2 puffs into the lungs 2 (two) times daily.  . montelukast (SINGULAIR) 10 MG tablet Take 10 mg by mouth daily.  . pantoprazole (PROTONIX) 40 MG tablet Take 40 mg by mouth daily.  . potassium chloride SA (KLOR-CON) 20 MEQ tablet Take 20 mEq by mouth 2 (two) times daily.  . rosuvastatin (CRESTOR) 10 MG tablet Take 10 mg by mouth daily.  . [DISCONTINUED] amLODipine (NORVASC) 10 MG tablet Take 10 mg by mouth daily.    Allergies:   Ibuprofen, Glipizide, Hydroxychloroquine, Metformin and related, Saline nasal spray, and Zyrtec [cetirizine]   Social History   Socioeconomic History  . Marital status: Legally Separated    Spouse name: Not on file  . Number of children: Not on file  . Years of education: Not on file  . Highest education level: Not on file  Occupational History  . Not on file  Tobacco Use  . Smoking status: Former Games developer  . Smokeless tobacco: Never Used  Substance and Sexual Activity  . Alcohol use: Never  . Drug use: Never  . Sexual activity: Not on file  Other Topics Concern  .  Not on file  Social History Narrative  . Not on file   Social Determinants of Health   Financial Resource Strain: Not on file  Food Insecurity: Not on file  Transportation Needs: Not on file  Physical Activity: Not on file  Stress: Not on file  Social Connections: Not on file    SOCIAL: Daughter's name Is Amelia  Family History: The patient's brother and sister died from heart disease NOS  ROS:   Please see the history of present illness.    All other systems reviewed and are negative.  EKGs/Labs/Other Studies Reviewed:    The following studies were reviewed today:  EKG:   04/17/20:  SR rate 82 no ST/T changes  Transthoracic Echocardiogram: Date: 06/05/20 Results: Basal Septal Hypertrophy without SAM or  evidence of LVOT gradient 1. Left ventricular ejection fraction, by estimation, is 65 to 70%. Left  ventricular ejection fraction by 3D volume is 69 %. The left ventricle has  normal function. The left ventricle has no regional wall motion  abnormalities. There is severe left  ventricular hypertrophy of the basal-septal segment. Left ventricular  diastolic parameters are consistent with Grade I diastolic dysfunction  (impaired relaxation). Elevated left ventricular end-diastolic pressure.  The average left ventricular global  longitudinal strain is -17.7 %. The global longitudinal strain is normal.  2. Right ventricular systolic function is normal. The right ventricular  size is normal. There is normal pulmonary artery systolic pressure. The  estimated right ventricular systolic pressure is 29.6 mmHg.  3. The mitral valve is normal in structure. Mild mitral valve  regurgitation. No evidence of mitral stenosis.  4. The aortic valve is tricuspid. Aortic valve regurgitation is trivial.  Mild aortic valve sclerosis is present, with no evidence of aortic valve  stenosis.  5. The inferior vena cava is normal in size with greater than 50%  respiratory variability, suggesting right atrial pressure of 3 mmHg.   Recent Labs: 04/16/2020: Hemoglobin 13.4; Platelets 233 05/08/2020: NT-Pro BNP 41 05/15/2020: BUN 19; Creatinine, Ser 0.80; Magnesium 2.1; Potassium 3.8; Sodium 144  Recent Lipid Panel No results found for: CHOL, TRIG, HDL, CHOLHDL, VLDL, LDLCALC, LDLDIRECT  Outside Hospital  or Outside Clinic Studies (OSH):  Date: 04/30/20 Cholesterol 100 HDL40 LDL 43 Tgs 78 Creatinine 0.8 TSH NA BNP NA (see above)   Risk Assessment/Calculations:     None  Physical Exam:    VS:  BP 122/74   Pulse 76   Ht 5\' 7"  (1.702 m)   Wt 216 lb (98 kg)   BMI 33.83 kg/m     Wt Readings from Last 3 Encounters:  07/16/20 216 lb (98 kg)  05/08/20 214 lb 6.4 oz (97.3 kg)  04/16/20 209 lb (94.8  kg)    GEN: Well nourished, well developed in no acute distress HEENT: Normal NECK: No JVD; No carotid bruits LYMPHATICS: No lymphadenopathy CARDIAC: RRR, no murmurs, rubs, gallops RESPIRATORY:  Clear to auscultation without rales, wheezing or rhonchi  ABDOMEN: Soft, non-tender, non-distended MUSCULOSKELETAL:  Non-pitting edema (was wearing compression stockings prior to exam); No deformity  SKIN: Warm and dry NEUROLOGIC:  Alert and oriented x 3 PSYCHIATRIC:  Normal affect   ASSESSMENT:    1. Chronic heart failure with preserved ejection fraction (HCC)   2. Diabetes mellitus with coincident hypertension (HCC)   3. Claudication (HCC)    PLAN:     Heart Failure with presevered EF - NYHA class II, Stage B, euvoloemic, etiology from LVH - lasix 20 mg PO daily -  BB: Continue atenolol 50 mg - continue losartan 100 mg daily - Strict I/Os, daily weights, and fluid restriction of < 2 L   Diabetes with hypertension - ambulatory blood pressure not done, will start ambulatory BP monitoring; gave education on how to perform ambulatory blood pressure monitoring including the frequency and technique; goal ambulatory blood pressure < 135/85 on average - continue home medications with the exception of amlodipine; may need to increase atenolol based on amb BP - discussed diet (DASH/low sodium), and exercise/weight loss interventions   Lower extremity Edema (bilateral) with concern for venous insufficiency - leg claudication  - will get Arterial duplex bilateral and ABI - no evidence of VTE; will order Bilateral Venous Lower Extremity Reflux study - will continue utilization of compression stockings - stopping norvasc - if no improvement by follow up visit, with no other cause, will send to vein/vascular specialist for evaluation    Three to four months follow up unless new symptoms or abnormal test results warranting change in plan  Would be reasonable for  APP Follow up    Shared  Decision Making/Informed Consent     Medication Adjustments/Labs and Tests Ordered: Current medicines are reviewed at length with the patient today.  Concerns regarding medicines are outlined above.  Orders Placed This Encounter  Procedures  . VAS [place] ABI WITH/WO TBI  . VAS [place] LOWER EXTREMITY ARTERIAL DUPLEX  . VAS [place] LOWER EXTREMITY VENOUS REFLUX   No orders of the defined types were placed in this encounter.   Patient Instructions  Medication Instructions:  Your physician has recommended you make the following change in your medication:   STOP: amlodipine (Norvasc)  *If you need a refill on your cardiac medications before your next appointment, please call your pharmacy*   Lab Work: NONE If you have labs (blood work) drawn today and your tests are completely normal, you will receive your results only by: Marland Kitchen MyChart Message (if you have MyChart) OR . A paper copy in the mail If you have any lab test that is abnormal or we need to change your treatment, we will call you to review the results.   Testing/Procedures: Your physician has requested that you have a lower  extremity arterial duplex. This test is an ultrasound of the arteries in the legs. It looks at arterial blood flow in the legs. Allow one hour for Lower and Upper Arterial scans. There are no restrictions or special instructions    Follow-Up: At [facility], you and your health needs are our priority.  As part of our continuing mission to provide you with exceptional heart care, we have created designated Provider Care Teams.  These Care Teams include your primary Cardiologist (physician) and Advanced Practice Providers (APPs -  Physician Assistants and Nurse Practitioners) who all work together to provide you with the care you need, when you need it.  We recommend signing up for the patient portal called "MyChart".  Sign up information is provided on this After Visit Summary.  MyChart is used to connect with  patients for Virtual Visits (Telemedicine).  Patients are able to view lab/test results, encounter notes, upcoming appointments, etc.  Non-urgent messages can be sent to your provider as well.   To learn more about what you can do with MyChart, go to [email or web address].    Your next appointment:   3-4 months  The format for your next appointment:   In Person  Provider:   You may see Christell Constant, MD or  one of the following Advanced Practice Providers on your designated Care Team:    Ronie Spies, PA-C  Jacolyn Reedy, PA-C    Other Instructions Your physician has requested that you regularly monitor and record your blood pressure readings at home. Please use the same machine at the same time of day to check your readings and record them to bring to your follow-up visit. Please notify office if BP readings are high.      Signed, Christell Constant, MD  07/16/2020 9:17 AM    [facility]

## 2020-07-28 ENCOUNTER — Emergency Department (HOSPITAL_COMMUNITY): Payer: Medicare Other

## 2020-07-28 ENCOUNTER — Encounter (HOSPITAL_COMMUNITY): Payer: Self-pay | Admitting: *Deleted

## 2020-07-28 ENCOUNTER — Emergency Department (HOSPITAL_COMMUNITY)
Admission: EM | Admit: 2020-07-28 | Discharge: 2020-07-29 | Disposition: A | Discharge status: Home / Self Care | Payer: Medicare Other | Attending: Emergency Medicine | Admitting: Emergency Medicine

## 2020-07-28 ENCOUNTER — Other Ambulatory Visit: Payer: Self-pay

## 2020-07-28 DIAGNOSIS — R0789 Other chest pain: Secondary | ICD-10-CM | POA: Diagnosis not present

## 2020-07-28 DIAGNOSIS — Z5321 Procedure and treatment not carried out due to patient leaving prior to being seen by health care provider: Secondary | ICD-10-CM | POA: Insufficient documentation

## 2020-07-28 DIAGNOSIS — R2243 Localized swelling, mass and lump, lower limb, bilateral: Secondary | ICD-10-CM | POA: Diagnosis not present

## 2020-07-28 DIAGNOSIS — R0602 Shortness of breath: Secondary | ICD-10-CM | POA: Diagnosis not present

## 2020-07-28 DIAGNOSIS — R079 Chest pain, unspecified: Secondary | ICD-10-CM | POA: Diagnosis not present

## 2020-07-28 DIAGNOSIS — I10 Essential (primary) hypertension: Secondary | ICD-10-CM | POA: Diagnosis not present

## 2020-07-28 LAB — BASIC METABOLIC PANEL
Anion gap: 11 (ref 5–15)
BUN: 14 mg/dL (ref 8–23)
CO2: 23 mmol/L (ref 22–32)
Calcium: 9.9 mg/dL (ref 8.9–10.3)
Chloride: 109 mmol/L (ref 98–111)
Creatinine, Ser: 0.86 mg/dL (ref 0.44–1.00)
GFR, Estimated: >60 mL/min (ref >60)
Glucose, Bld: 76 mg/dL (ref 70–99)
Potassium: 4.1 mmol/L (ref 3.5–5.1)
Sodium: 143 mmol/L (ref 135–145)

## 2020-07-28 LAB — CBC
HCT: 40.2 % (ref 36.0–46.0)
Hemoglobin: 13.3 g/dL (ref 12.0–15.0)
MCH: 28.5 pg (ref 26.0–34.0)
MCHC: 33.1 g/dL (ref 30.0–36.0)
MCV: 86.3 fL (ref 80.0–100.0)
Platelets: 238 10*3/uL (ref 150–400)
RBC: 4.66 MIL/uL (ref 3.87–5.11)
RDW: 15.9 % — ABNORMAL HIGH (ref 11.5–15.5)
WBC: 7.7 10*3/uL (ref 4.0–10.5)
nRBC: 0 % (ref 0.0–0.2)

## 2020-07-28 LAB — TROPONIN I (HIGH SENSITIVITY)
Troponin I (High Sensitivity): <2 ng/L (ref <18)
Troponin I (High Sensitivity): <2 ng/L (ref <18)

## 2020-07-28 IMAGING — DX DG CHEST 2V
2 series · 2 of 2 positions shown · non-contrast
Comparison: [DATE]

CLINICAL DATA: Chest pain, leg and feet swelling, recently taken
off Lasix, mild shortness of breath, some chest discomfort for
several days

EXAM:
CHEST - 2 VIEW

[chest pa]
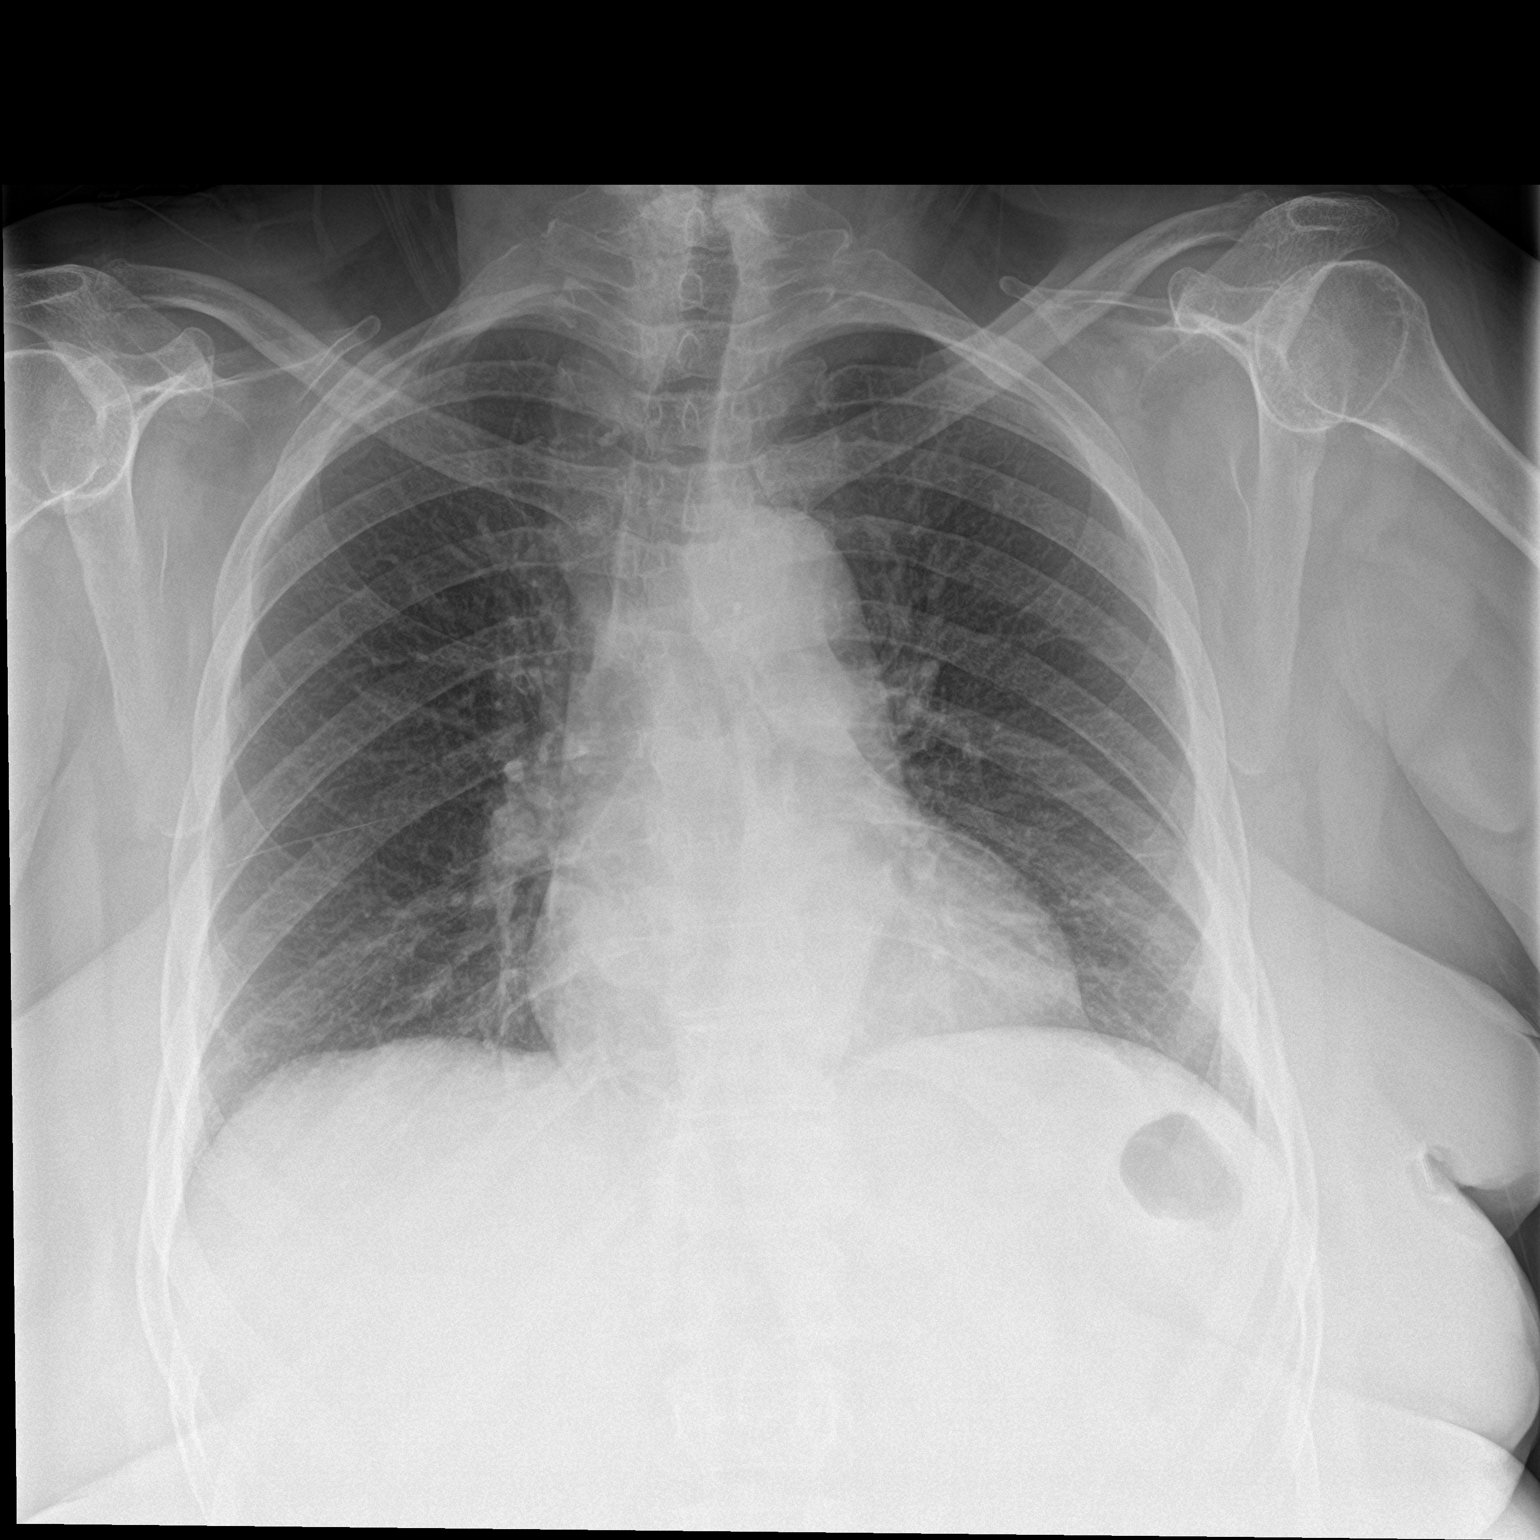

[chest lat]
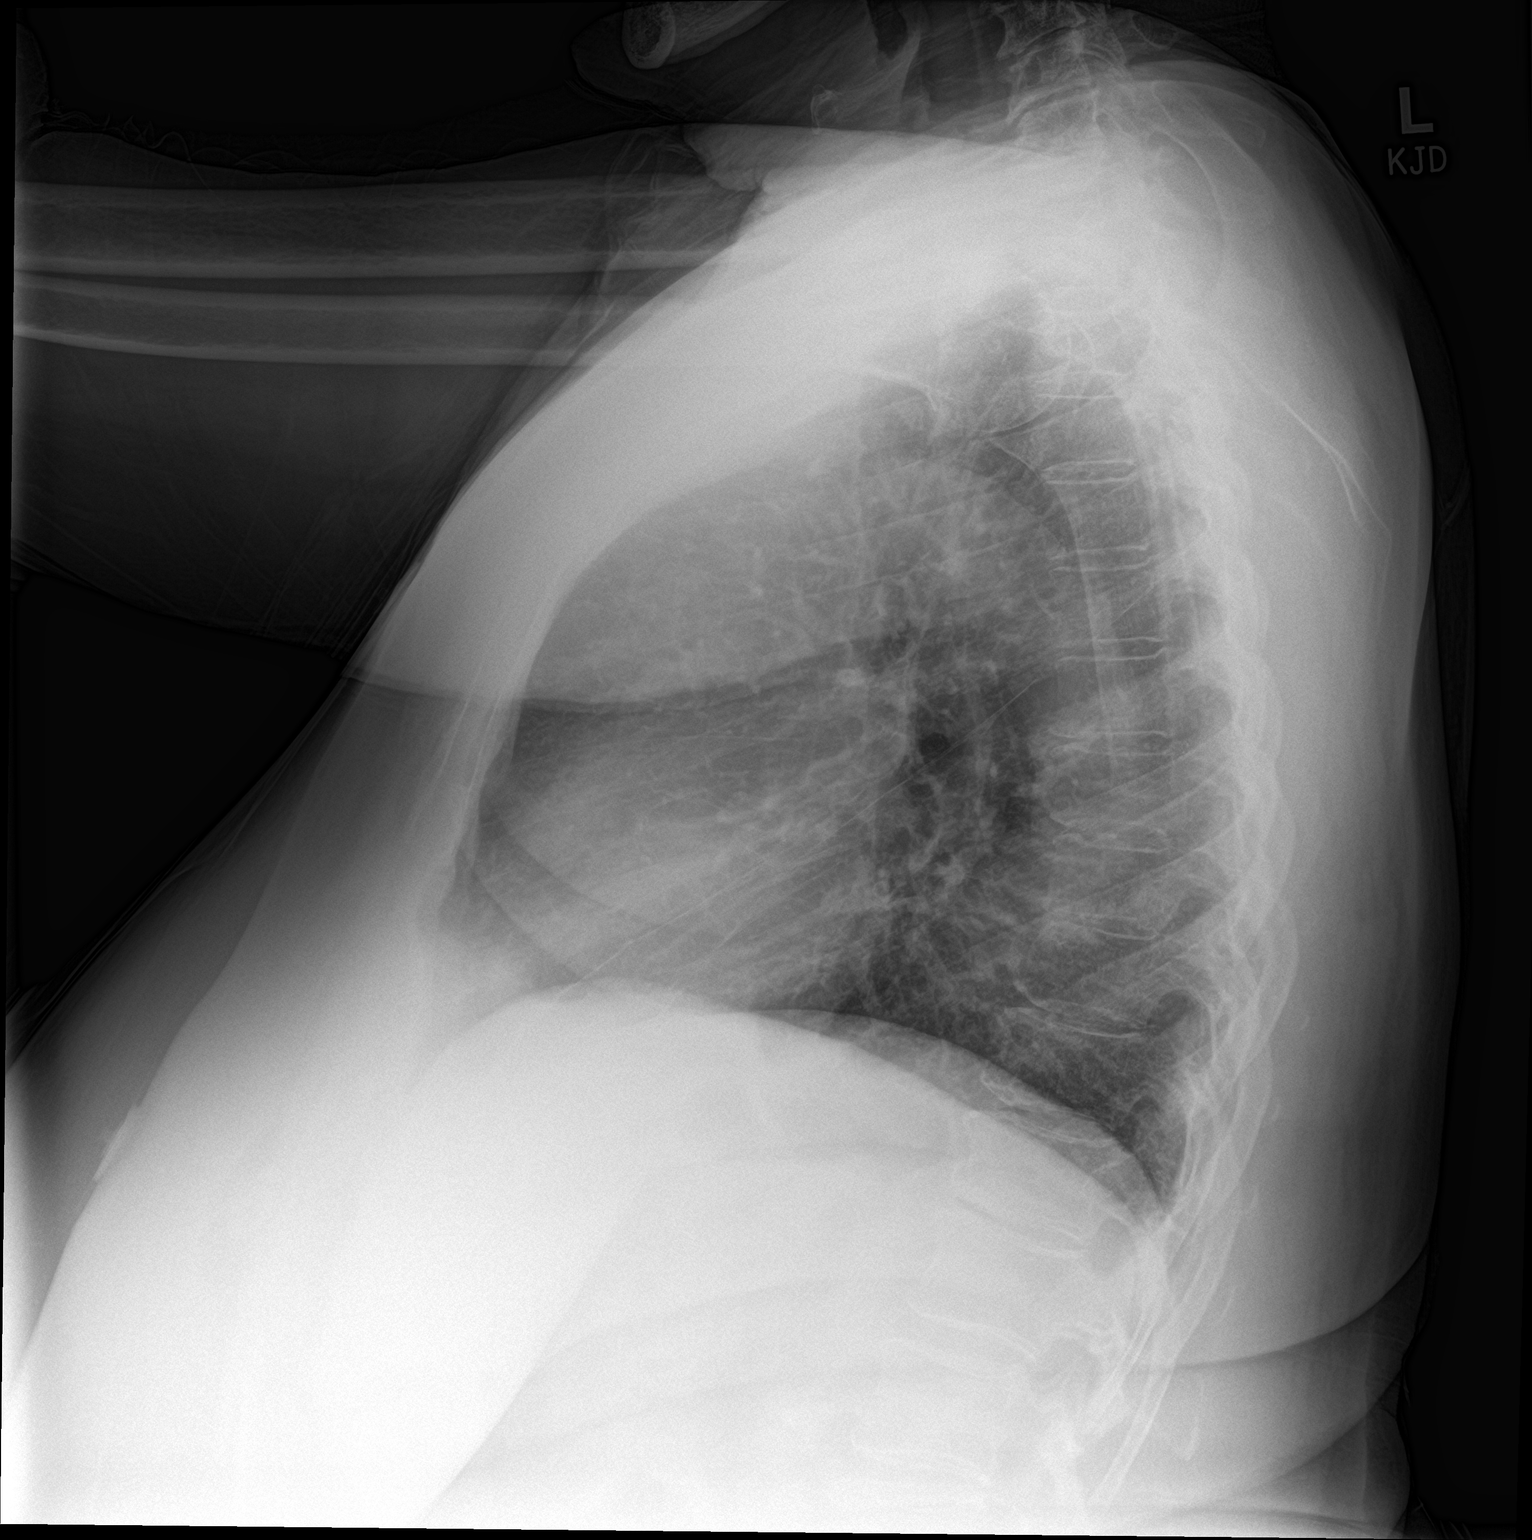

[2 of 2 positions shown; findings below may reference images not displayed]

FINDINGS: Normal heart size, mediastinal contours, and pulmonary vascularity.

Lungs clear.

No pulmonary infiltrate, pleural effusion or pneumothorax.

Osseous structures unremarkable.
IMPRESSION: No acute abnormalities.

## 2020-07-28 NOTE — ED Triage Notes (Signed)
Pt says her legs and feet are swelling, recently taken off lasix. Doing a routine check on her BP at home and it was elevated 160/100's. C/o some chest discomfort for several days. Mild sob.

## 2020-07-29 NOTE — ED Notes (Signed)
Pt left without been seen

## 2020-07-31 ENCOUNTER — Other Ambulatory Visit: Payer: Self-pay | Admitting: *Deleted

## 2020-07-31 ENCOUNTER — Other Ambulatory Visit: Payer: Self-pay

## 2020-07-31 ENCOUNTER — Emergency Department (HOSPITAL_COMMUNITY): Payer: Medicare Other

## 2020-07-31 ENCOUNTER — Telehealth: Payer: Self-pay | Admitting: Internal Medicine

## 2020-07-31 ENCOUNTER — Encounter (HOSPITAL_COMMUNITY): Payer: Self-pay

## 2020-07-31 DIAGNOSIS — I5032 Chronic diastolic (congestive) heart failure: Secondary | ICD-10-CM | POA: Insufficient documentation

## 2020-07-31 DIAGNOSIS — Z7982 Long term (current) use of aspirin: Secondary | ICD-10-CM | POA: Insufficient documentation

## 2020-07-31 DIAGNOSIS — Z7951 Long term (current) use of inhaled steroids: Secondary | ICD-10-CM | POA: Insufficient documentation

## 2020-07-31 DIAGNOSIS — R0789 Other chest pain: Secondary | ICD-10-CM | POA: Diagnosis not present

## 2020-07-31 DIAGNOSIS — I10 Essential (primary) hypertension: Secondary | ICD-10-CM | POA: Insufficient documentation

## 2020-07-31 DIAGNOSIS — Z87891 Personal history of nicotine dependence: Secondary | ICD-10-CM | POA: Diagnosis not present

## 2020-07-31 DIAGNOSIS — Z79899 Other long term (current) drug therapy: Secondary | ICD-10-CM | POA: Insufficient documentation

## 2020-07-31 DIAGNOSIS — R002 Palpitations: Secondary | ICD-10-CM | POA: Insufficient documentation

## 2020-07-31 DIAGNOSIS — R0602 Shortness of breath: Secondary | ICD-10-CM | POA: Diagnosis not present

## 2020-07-31 DIAGNOSIS — E119 Type 2 diabetes mellitus without complications: Secondary | ICD-10-CM | POA: Insufficient documentation

## 2020-07-31 DIAGNOSIS — J45909 Unspecified asthma, uncomplicated: Secondary | ICD-10-CM | POA: Insufficient documentation

## 2020-07-31 IMAGING — CR DG CHEST 2V
2 series · 2 of 2 positions shown · non-contrast
Comparison: [DATE]

CLINICAL DATA: Elevated blood pressure and shortness of breath.

EXAM:
CHEST - 2 VIEW

[w chest lat]
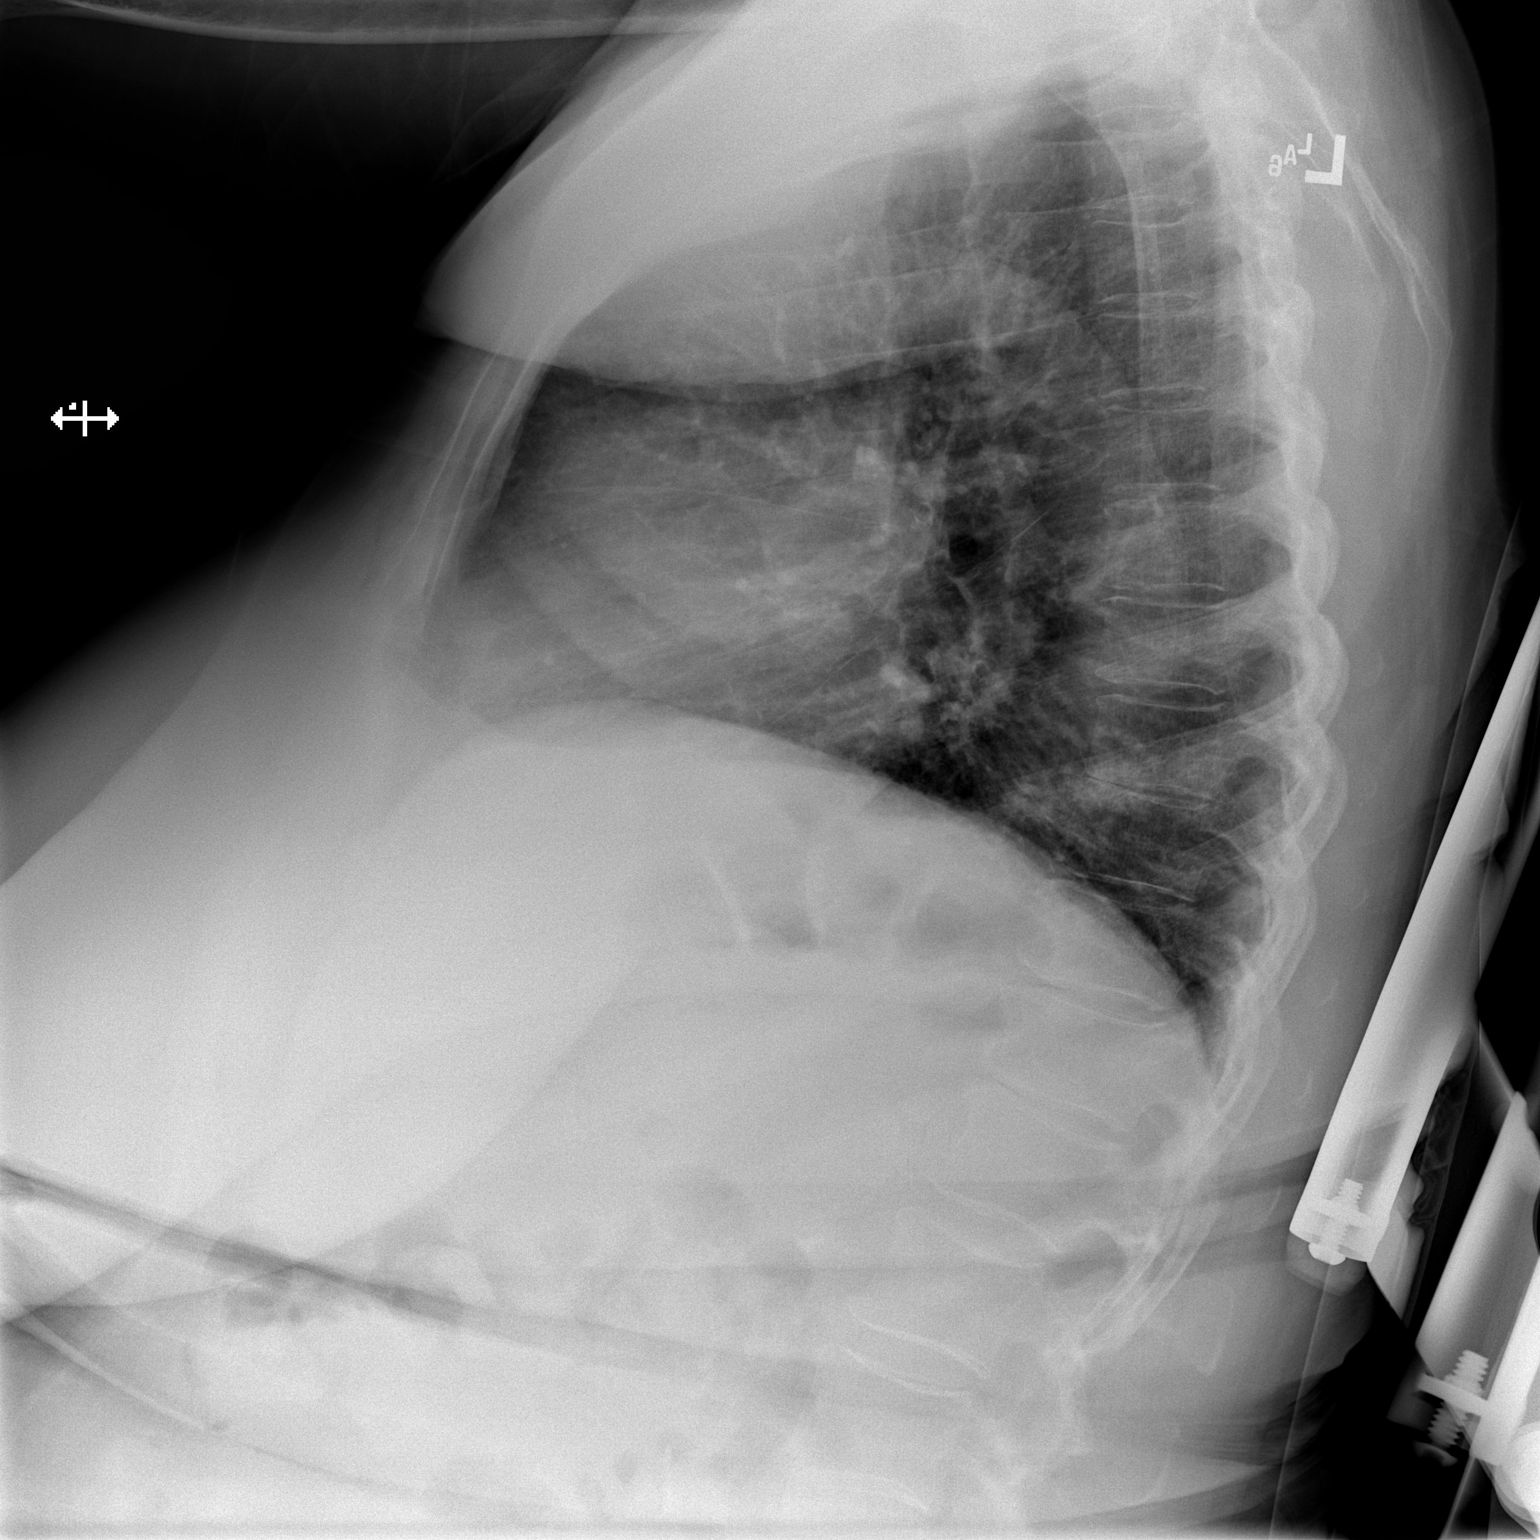

[x chest ap]
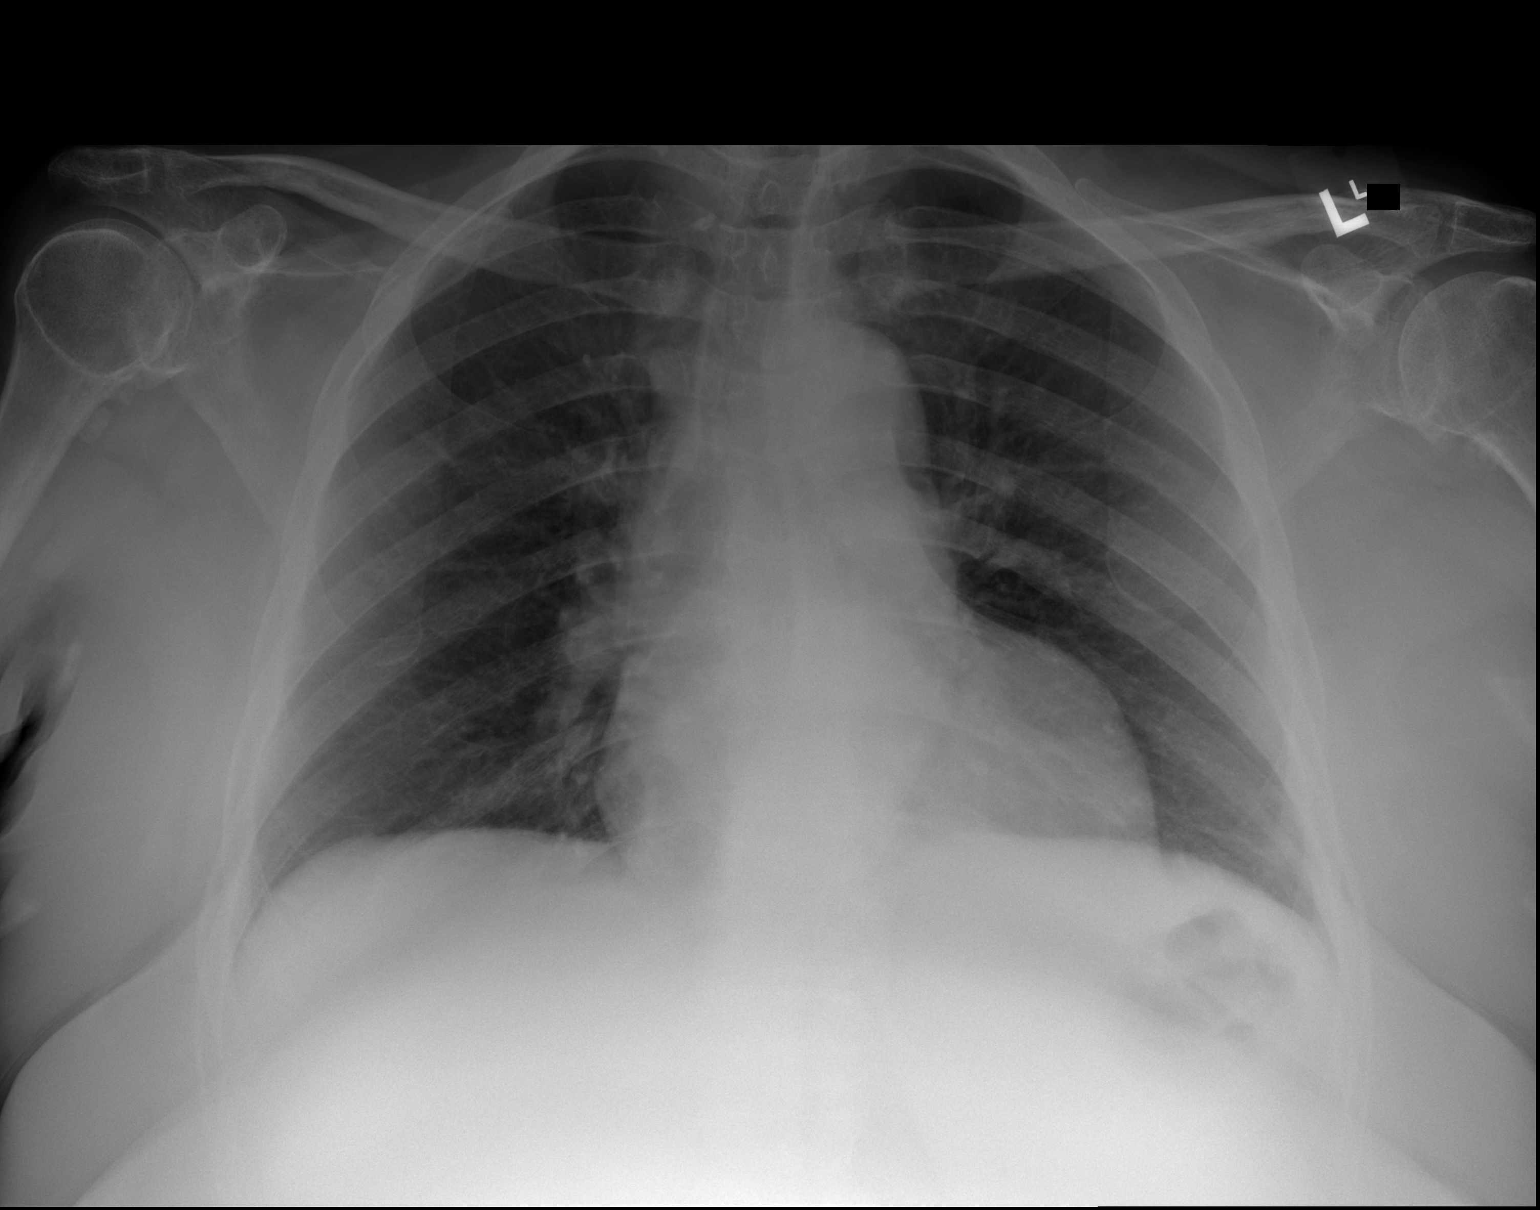

[2 of 2 positions shown; findings below may reference images not displayed]

FINDINGS: The heart size and mediastinal contours are within normal limits.
Both lungs are clear. The visualized skeletal structures are
unremarkable.
IMPRESSION: No active cardiopulmonary disease.

## 2020-07-31 MED ORDER — ATENOLOL 100 MG PO TABS: 100.0000 mg | ORAL_TABLET | Freq: Every day | ORAL | 3 refills | Status: DC

## 2020-07-31 NOTE — Progress Notes (Unsigned)
AMB REF 

## 2020-07-31 NOTE — Telephone Encounter (Signed)
New Message:    Dr Wendi Snipes one of pill and told her to call if blood pressure started going back up.  On Sat it started going back up. She went to there ER and stayed for 12 hours and left, because she had not been seen.   Pt c/o BP issue: STAT if pt c/o blurred vision, one-sided weakness or slurred speech  1. What are your last 5 BP readings? 156/83 today, she did not have the reading with her  2. Are you having any other symptoms (ex. Dizziness, headache, blurred vision, passed out)?  Dizzy chest is tight and face is tight right now  3. What is your BP issue? Blood pressure is up

## 2020-07-31 NOTE — ED Triage Notes (Signed)
Pt reports hypertension of about 180/100 at home. Cardiologist changed dosage of atenolol from 50mg  to 100mg  beginning tomorrow. Pt sts mild shob.

## 2020-07-31 NOTE — Telephone Encounter (Signed)
Called with BP going back up after amlodipine was discontinued at last OV 07/16/20. No missed doses of medication. No increase of salt intake.  BP readings:  07/26/20 127/86 65 10:30 am   07/28/20 141/101 77 6 pm BP that she went to the ER for. (left without being seen) 166/101 77  07/29/20 154/92 69  9 am 156/96 65 9:15 am    07/30/20 - None  07/31/20 156/83 1:30 pm 163/88 2:45 pm  Having headaches, dizziness and tightness in her chest. The tightness comes and goes and when it flares up it gets to an 8 out of 10, she can feel her heart beating in her chest when this occurs describing as beating harder than normal, but not fast.   Of note ER 07/28/20 Troponin less than 2 X2, CBC & BMP WNL, CXR No acute abnormalities, EKG no stemi.   Advised that I will route to Dr. Izora Ribas for advisement.

## 2020-07-31 NOTE — Addendum Note (Signed)
Addended by: Sampson Goon on: 07/31/2020 04:58 PM   Modules accepted: Orders

## 2020-07-31 NOTE — Telephone Encounter (Addendum)
Called patient to increase atenolol to 100 mg daily. Advised to continue to keep record of blood pressures to see if this increase helps her blood pressures. To call back next week IF it has not. ED precautions given.  Patient verbalized understanding.

## 2020-07-31 NOTE — Telephone Encounter (Signed)
Let's increase in atenolol to 100 mg PO Daily.  If this does not improve her blood pressures; our next option would be stopping her potassium and starting her on spironolactone 25 mg PO daily.  Christell Constant, MD

## 2020-08-01 ENCOUNTER — Emergency Department (HOSPITAL_COMMUNITY): Payer: Medicare Other

## 2020-08-01 ENCOUNTER — Emergency Department (HOSPITAL_COMMUNITY)
Admission: EM | Admit: 2020-08-01 | Discharge: 2020-08-01 | Disposition: A | Discharge status: Home / Self Care | Payer: Medicare Other | Attending: Emergency Medicine | Admitting: Emergency Medicine

## 2020-08-01 DIAGNOSIS — R002 Palpitations: Secondary | ICD-10-CM

## 2020-08-01 DIAGNOSIS — R0789 Other chest pain: Secondary | ICD-10-CM

## 2020-08-01 LAB — CBC WITH DIFFERENTIAL/PLATELET
Abs Immature Granulocytes: 0.01 10*3/uL (ref 0.00–0.07)
Basophils Absolute: 0 10*3/uL (ref 0.0–0.1)
Basophils Relative: 0 %
Eosinophils Absolute: 0.3 10*3/uL (ref 0.0–0.5)
Eosinophils Relative: 4 %
HCT: 40.8 % (ref 36.0–46.0)
Hemoglobin: 13.2 g/dL (ref 12.0–15.0)
Immature Granulocytes: 0 %
Lymphocytes Relative: 44 %
Lymphs Abs: 3.1 10*3/uL (ref 0.7–4.0)
MCH: 28.2 pg (ref 26.0–34.0)
MCHC: 32.4 g/dL (ref 30.0–36.0)
MCV: 87.2 fL (ref 80.0–100.0)
Monocytes Absolute: 0.7 10*3/uL (ref 0.1–1.0)
Monocytes Relative: 10 %
Neutro Abs: 3 10*3/uL (ref 1.7–7.7)
Neutrophils Relative %: 42 %
Platelets: 239 10*3/uL (ref 150–400)
RBC: 4.68 MIL/uL (ref 3.87–5.11)
RDW: 15.9 % — ABNORMAL HIGH (ref 11.5–15.5)
WBC: 7.2 10*3/uL (ref 4.0–10.5)
nRBC: 0 % (ref 0.0–0.2)

## 2020-08-01 LAB — BASIC METABOLIC PANEL
Anion gap: 13 (ref 5–15)
BUN: 11 mg/dL (ref 8–23)
CO2: 22 mmol/L (ref 22–32)
Calcium: 10.2 mg/dL (ref 8.9–10.3)
Chloride: 107 mmol/L (ref 98–111)
Creatinine, Ser: 0.82 mg/dL (ref 0.44–1.00)
GFR, Estimated: >60 mL/min (ref >60)
Glucose, Bld: 124 mg/dL — ABNORMAL HIGH (ref 70–99)
Potassium: 3.7 mmol/L (ref 3.5–5.1)
Sodium: 142 mmol/L (ref 135–145)

## 2020-08-01 LAB — TROPONIN I (HIGH SENSITIVITY)
Troponin I (High Sensitivity): <2 ng/L (ref <18)
Troponin I (High Sensitivity): <2 ng/L (ref <18)

## 2020-08-01 NOTE — ED Provider Notes (Signed)
[facility]-EMERGENCY DEPT Provider Note   CSN: [id number] Arrival date & time: 07/31/20  2102   History Chief Complaint  Patient presents with  . Hypertension    Donna Garner is a 75 y.o. female.  The history is provided by the patient.  Hypertension  She has history of hypertension, diabetes, diastolic heart failure and comes in because she could feel her heart beating over the last 3 days and blood pressure was elevated.  Blood pressure at home has been as high as 161/101.  She notices her heart beating mainly when she sits down after walking.  There is frequently a pressure feeling in her chest at the same time.  This can last up to an hour.  There is slight associated dyspnea but no nausea or diaphoresis.  She has had the same symptom wake her up at night.  Past Medical History:  Diagnosis Date  . Asthma   . Cardiomegaly   . CHF (congestive heart failure) (HCC)   . Cough   . Diabetes (HCC)   . Dyspnea   . History of swelling of feet   . Hypertension   . SOB (shortness of breath)   . Swelling of ankle     Patient Active Problem List   Diagnosis Date Noted  . Claudication (HCC) 07/16/2020  . Chronic heart failure with preserved ejection fraction (HCC) 05/08/2020  . Diabetes mellitus with coincident hypertension (HCC) 05/08/2020  . Chest pain of uncertain etiology 05/08/2020    History reviewed. No pertinent surgical history.   OB History   No obstetric history on file.     Family History  Problem Relation Age of Onset  . Heart disease Sister   . Heart disease Brother     Social History   Tobacco Use  . Smoking status: Former Games developer  . Smokeless tobacco: Never Used  Substance Use Topics  . Alcohol use: Never  . Drug use: Never    Home Medications Prior to Admission medications   Medication Sig Start Date End Date Taking? Authorizing Provider  aspirin EC 81 MG tablet Take 81 mg by mouth daily. Swallow whole.    [provider]  atenolol (TENORMIN) 100 MG tablet Take 1 tablet (100 mg total) by mouth daily. 07/31/20   Allred, Fayrene Fearing, MD  calcium-vitamin D (OSCAL WITH D) 500-200 MG-UNIT tablet Take 1 tablet by mouth.    [provider]  furosemide (LASIX) 20 MG tablet Take 1 tablet (20 mg total) by mouth daily. 05/08/20   Chandrasekhar, Rondel Jumbo, MD  guaiFENesin (MUCINEX) 600 MG 12 hr tablet Take by mouth 2 (two) times daily as needed for cough or to loosen phlegm.    [provider]  losartan (COZAAR) 100 MG tablet Take 100 mg by mouth daily. 04/12/20   [provider]  mometasone-formoterol (DULERA) 100-5 MCG/ACT AERO Inhale 2 puffs into the lungs 2 (two) times daily.    [provider]  montelukast (SINGULAIR) 10 MG tablet Take 10 mg by mouth daily. 04/12/20   [provider]  pantoprazole (PROTONIX) 40 MG tablet Take 40 mg by mouth daily.    [provider]  potassium chloride SA (KLOR-CON) 20 MEQ tablet Take 20 mEq by mouth 2 (two) times daily. 04/12/20   [provider]  rosuvastatin (CRESTOR) 10 MG tablet Take 10 mg by mouth daily.    [provider]    Allergies    Glipizide, Hydroxychloroquine, Ibuprofen, Metformin, Metformin and related, Saline  nasal spray, and Zyrtec [cetirizine]  Review of Systems   Review of Systems  All other systems reviewed and are negative.   Physical Exam Updated Vital Signs BP (!) 166/90 (BP Location: Left Arm)   Pulse 76   Temp 98.8 F (37.1 C) (Oral)   Resp 19   Ht 5\' 7"  (1.702 m)   Wt 97.1 kg   SpO2 99%   BMI 33.52 kg/m   Physical Exam Vitals and nursing note reviewed.   75 year old female, resting comfortably and in no acute distress. Vital signs are significant for mildly elevated blood pressure. Oxygen saturation is 98%, which is normal. Head is normocephalic and atraumatic. PERRLA, EOMI. Oropharynx is clear. Neck is nontender and supple without adenopathy or JVD. Back is  nontender and there is no CVA tenderness. Lungs are clear without rales, wheezes, or rhonchi. Chest is nontender. Heart has regular rate and rhythm without murmur. Abdomen is soft, flat, nontender without masses or hepatosplenomegaly and peristalsis is normoactive. Extremities have trace edema, full range of motion is present. Skin is warm and dry without rash. Neurologic: Mental status is normal, cranial nerves are intact, there are no motor or sensory deficits.  ED Results / Procedures / Treatments   Labs (all labs ordered are listed, but only abnormal results are displayed) Labs Reviewed  BASIC METABOLIC PANEL - Abnormal; Notable for the following components:      Result Value   Glucose, Bld 124 (*)    All other components within normal limits  CBC WITH DIFFERENTIAL/PLATELET - Abnormal; Notable for the following components:   RDW 15.9 (*)    All other components within normal limits  TROPONIN I (HIGH SENSITIVITY)  TROPONIN I (HIGH SENSITIVITY)    EKG EKG Interpretation  Date/Time:  Wednesday August 01 2020 01:41:19 EST Ventricular Rate:  66 PR Interval:    QRS Duration: 95 QT Interval:  411 QTC Calculation: 431 R Axis:   -40 Text Interpretation: Sinus rhythm Left axis deviation RSR' in V1 or V2, right VCD or RVH When compared with ECG of 07/28/2020, No significant change was found Confirmed by 07/30/2020 (Dione Booze) on 08/01/2020 1:50:40 AM   Radiology DG Chest 2 View  Result Date: 07/31/2020 CLINICAL DATA:  Elevated blood pressure and shortness of breath. EXAM: CHEST - 2 VIEW COMPARISON:  July 28, 2020 FINDINGS: The heart size and mediastinal contours are within normal limits. Both lungs are clear. The visualized skeletal structures are unremarkable. IMPRESSION: No active cardiopulmonary disease. Electronically Signed   By: July 30, 2020 M.D.   On: 07/31/2020 22:38    Procedures Procedures   Medications Ordered in ED Medications - No data to display  ED  Course  I have reviewed the triage vital signs and the nursing notes.  Pertinent labs & imaging results that were available during my care of the patient were reviewed by me and considered in my medical decision making (see chart for details).  MDM Rules/Calculators/A&P Sense of feeling her heartbeat with what may represent angina.  Old records are reviewed showing that she is followed in the heart failure clinic.  Echocardiogram 06/05/2020 showed ejection fraction 65-70% with grade 1 diastolic dysfunction.  Blood pressure is reasonable in the ED.  Will check electrolytes and will check troponin x2.  ECG shows no acute changes.  Chest x-ray is unremarkable.  Labs are normal including troponin x2.  Patient has had no further symptoms in the ED, and blood pressure has come down considerably without any treatment.  She is discharged and referred back to her cardiologist for consideration for stress testing.  Return precautions discussed.  Final Clinical Impression(s) / ED Diagnoses Final diagnoses:  Chest discomfort  Palpitations    Rx / DC Orders ED Discharge Orders    None       Dione Booze, MD 08/01/20 [phone]

## 2020-08-01 NOTE — Discharge Instructions (Signed)
Please continue to check your blood pressure once a day.  Keep a record of all the readings, and bring them with you when you see your physician.  Return to the emergency department if you are having any concerning symptoms.

## 2020-08-02 ENCOUNTER — Inpatient Hospital Stay (HOSPITAL_COMMUNITY): Admission: RE | Admit: 2020-08-02 | Payer: Medicare Other | Source: Ambulatory Visit

## 2020-08-02 ENCOUNTER — Ambulatory Visit (HOSPITAL_COMMUNITY)
Admission: RE | Admit: 2020-08-02 | Discharge: 2020-08-02 | Disposition: A | Discharge status: Home / Self Care | Payer: Medicare Other | Source: Ambulatory Visit | Attending: Cardiovascular Disease | Admitting: Cardiovascular Disease

## 2020-08-02 ENCOUNTER — Other Ambulatory Visit: Payer: Self-pay

## 2020-08-02 DIAGNOSIS — I739 Peripheral vascular disease, unspecified: Secondary | ICD-10-CM | POA: Diagnosis not present

## 2020-08-03 DIAGNOSIS — I10 Essential (primary) hypertension: Secondary | ICD-10-CM | POA: Diagnosis not present

## 2020-08-03 DIAGNOSIS — Z9109 Other allergy status, other than to drugs and biological substances: Secondary | ICD-10-CM | POA: Diagnosis not present

## 2020-08-03 DIAGNOSIS — J4540 Moderate persistent asthma, uncomplicated: Secondary | ICD-10-CM | POA: Diagnosis not present

## 2020-08-05 ENCOUNTER — Telehealth: Payer: Self-pay | Admitting: Physician Assistant

## 2020-08-05 NOTE — Telephone Encounter (Signed)
Paged after hours for medication clarifications.  Patient was seen by Dr. Izora Ribas February 7 and her amlodipine discontinued.  Patient was trying to clarify which medications she should have discontinued.  She is not taking her amlodipine since office visit.  She will keep track of her blood pressure and give [place] call.

## 2020-08-06 ENCOUNTER — Telehealth: Payer: Self-pay | Admitting: Internal Medicine

## 2020-08-06 NOTE — Telephone Encounter (Signed)
Donna Garner, Dr. Christena Flake nurse states there is some confusion over the patient's medications. She states the patient was in the ER on 08/01/2020 and they questioned the need for a stress test. She states the patient's BP is not quite controled and she was told to call after hours if she is concerned. She states when she was last seen the patient did not know a lot of the medications on her list and could not verify what she was taking. She states her BP was 140/80. She states to call her direct line [phone] and to leave a voicemail if she does not answer since she is also seeing patients.

## 2020-08-06 NOTE — Telephone Encounter (Signed)
Attempted phone call to [facility] with Dr Christena Flake office.  Left voicemail to contact triage at [phone].

## 2020-08-07 NOTE — Telephone Encounter (Signed)
Spoke with Corrie Dandy at Dr Christena Flake office who reiterates message as below.  She states Dr Chanetta Marshall wanted to ensure pt had follow up from her ED visit 02/23 d/t confusion re: medications and possible need for stress test.  Will contact pt to schedule f/u appt and forward information to Dr Izora Ribas for review.

## 2020-08-07 NOTE — Telephone Encounter (Signed)
Spoke with pt and advised of need to schedule ED follow up appointment with cardiology.  Appointment scheduled for 08/10/2020 at 840am.  Pt verbalizes understanding and agrees with current plan.

## 2020-08-07 NOTE — Telephone Encounter (Signed)
Thanks for your help:  Given that we never received anything from Dr. Milta Deiters office in [place], I have a low threshold to stress test her.  Talked with Dr. Preston Fleeting from the ED visit.  Christell Constant, MD

## 2020-08-08 ENCOUNTER — Telehealth: Payer: Self-pay | Admitting: Student

## 2020-08-08 DIAGNOSIS — I10 Essential (primary) hypertension: Secondary | ICD-10-CM

## 2020-08-08 MED ORDER — VALSARTAN 160 MG PO TABS: 160.0000 mg | ORAL_TABLET | Freq: Every day | ORAL | 2 refills | Status: DC

## 2020-08-08 NOTE — Telephone Encounter (Signed)
   Received page from Answering Service about concerns of BP. Called and spoke with patient and daughter. BP has been elevated today. Patient took her BP medications (Losartan 100mg  daily and Atenolol 100mg  daily) this morning around 9:45am. Around noon, BP 174/102. Around 4:30pm, BP 187/106. Shortly after 5:00pm, BP 180/104. Yesterday BP was in the 140s/80s. Heart rates always around 65 bpm. Patient has had a mild headache for the last couple of days which patient thinks may be sinus related. Headache is not worse today compared to yesterday. No other acute symptoms. No chest pain or stroke like symptoms. Will stop Losartan and start Valsartan 160mg  daily. Can take first dose tomorrow. Discussed with Dr. - given patient asymptomatic will hold off on prescribing PRN Hydralazine for patient to take tonight. Advised patient that if she should develop chest pain or stroke like symptoms, she should be seen in the ED.  Patient has a follow-up visit with Dr. on Friday. Advised patient to continue to keep BP/HR log and bring to visit. Will route note to Dr. Cristal Deer so that he is aware.   Izora Ribas, PA-C 08/08/2020 5:50 PM

## 2020-08-10 ENCOUNTER — Other Ambulatory Visit: Payer: Self-pay

## 2020-08-10 ENCOUNTER — Encounter: Payer: Self-pay | Admitting: Internal Medicine

## 2020-08-10 ENCOUNTER — Ambulatory Visit: Payer: Medicare Other | Admitting: Internal Medicine

## 2020-08-10 VITALS — BP 152/90 | HR 78 | Ht 67.0 in | Wt 211.6 lb

## 2020-08-10 DIAGNOSIS — R079 Chest pain, unspecified: Secondary | ICD-10-CM | POA: Diagnosis not present

## 2020-08-10 DIAGNOSIS — E119 Type 2 diabetes mellitus without complications: Secondary | ICD-10-CM | POA: Diagnosis not present

## 2020-08-10 DIAGNOSIS — I5032 Chronic diastolic (congestive) heart failure: Secondary | ICD-10-CM

## 2020-08-10 DIAGNOSIS — I10 Essential (primary) hypertension: Secondary | ICD-10-CM

## 2020-08-10 MED ORDER — IVABRADINE HCL 5 MG PO TABS: ORAL_TABLET | ORAL | 0 refills | Status: DC

## 2020-08-10 MED ORDER — VALSARTAN 160 MG PO TABS: 160.0000 mg | ORAL_TABLET | Freq: Every day | ORAL | 2 refills | Status: DC

## 2020-08-10 NOTE — Progress Notes (Signed)
Cardiology Office Note:    Date:  08/10/2020   ID:  Donna Garner, DOB [birth year], MRN [id number]  PCP:  Shon Haleimberlake, Kathryn S, MD  [facility] Cardiologist:  Christell ConstantMahesh A Clemente Dewey, MD  [facility] Electrophysiologist:  None   CC: Follow up DOE  History of Present Illness:    Donna Garner is a 75 y.o. female with a hx of Diabetes with HTN, Asthma, with history of HFpEF and [place] ([facility] who presented for evaluation 05/08/20.  In interim, we attempted to get [facility] but despite requesting records they were never received.  In interim of this visit (07/16/20), patient has had ED visit for chest pain with normal biomarkers and significant blood pressure issues.   Patient notes that she is doing poorly.  Has not started the the Valsartan because her normal pharmacy was out.   Notes chest pain sternally located with high blood pressures (187/106 on 08/08/20).  No DOE.  Has SOB but thinks it is sinus related as it correlates with change in weather and no PND/Orthopnea.  No weight gain. Leg swelling has greatly improved off norvasc .  No palpitations or syncope.  Ambulatory blood pressure 140/80 asymptomatic; 174-187/90-102. Log reviewed.  Past Medical History:  Diagnosis Date  . Asthma   . Cardiomegaly   . CHF (congestive heart failure) (HCC)   . Cough   . Diabetes (HCC)   . Dyspnea   . History of swelling of feet   . Hypertension   . SOB (shortness of breath)   . Swelling of ankle     No past surgical history on file.  Current Medications: Current Meds  Medication Sig  . aspirin EC 81 MG tablet Take 81 mg by mouth daily. Swallow whole.  Marland Kitchen. atenolol (TENORMIN) 100 MG tablet Take 1 tablet (100 mg total) by mouth daily.  . calcium-vitamin D (OSCAL WITH D) 500-200 MG-UNIT tablet Take 1 tablet by mouth.  . furosemide (LASIX) 20 MG tablet Take 1 tablet (20 mg total) by mouth daily.  Marland Kitchen. glimepiride (AMARYL) 2 MG tablet  Take 2 mg by mouth 2 (two) times daily.  Marland Kitchen. guaiFENesin (MUCINEX) 600 MG 12 hr tablet Take by mouth 2 (two) times daily as needed for cough or to loosen phlegm.  . ivabradine (CORLANOR) 5 MG TABS tablet Take 2 hours prior to Cardiac CT  . mometasone-formoterol (DULERA) 100-5 MCG/ACT AERO Inhale 2 puffs into the lungs 2 (two) times daily.  . montelukast (SINGULAIR) 10 MG tablet Take 10 mg by mouth daily.  . pantoprazole (PROTONIX) 40 MG tablet Take 40 mg by mouth daily.  . potassium chloride SA (KLOR-CON) 20 MEQ tablet Take 20 mEq by mouth 2 (two) times daily.  . rosuvastatin (CRESTOR) 10 MG tablet Take 10 mg by mouth daily.    Allergies:   Glipizide, Hydroxychloroquine, Ibuprofen, Metformin, Metformin and related, Saline nasal spray, and Zyrtec [cetirizine]   Social History   Socioeconomic History  . Marital status: Legally Separated    Spouse name: Not on file  . Number of children: Not on file  . Years of education: Not on file  . Highest education level: Not on file  Occupational History  . Not on file  Tobacco Use  . Smoking status: Former Games developermoker  . Smokeless tobacco: Never Used  Substance and Sexual Activity  . Alcohol use: Never  . Drug use: Never  . Sexual activity: Not on file  Other Topics  Concern  . Not on file  Social History Narrative  . Not on file   Social Determinants of Health   Financial Resource Strain: Not on file  Food Insecurity: Not on file  Transportation Needs: Not on file  Physical Activity: Not on file  Stress: Not on file  Social Connections: Not on file    SOCIAL: Daughter's name Is Lauris Poag who comes to every visit.  Family History: The patient's brother and sister died from heart disease NOS  ROS:   Please see the history of present illness.    All other systems reviewed and are negative.  EKGs/Labs/Other Studies Reviewed:    The following studies were reviewed today:  EKG:   04/17/20:  SR rate 82 no ST/T changes  Transthoracic  Echocardiogram: Date: 06/05/20 Results: Basal Septal Hypertrophy without SAM or evidence of LVOT gradient 1. Left ventricular ejection fraction, by estimation, is 65 to 70%. Left  ventricular ejection fraction by 3D volume is 69 %. The left ventricle has  normal function. The left ventricle has no regional wall motion  abnormalities. There is severe left  ventricular hypertrophy of the basal-septal segment. Left ventricular  diastolic parameters are consistent with Grade I diastolic dysfunction  (impaired relaxation). Elevated left ventricular end-diastolic pressure.  The average left ventricular global  longitudinal strain is -17.7 %. The global longitudinal strain is normal.  2. Right ventricular systolic function is normal. The right ventricular  size is normal. There is normal pulmonary artery systolic pressure. The  estimated right ventricular systolic pressure is 29.6 mmHg.  3. The mitral valve is normal in structure. Mild mitral valve  regurgitation. No evidence of mitral stenosis.  4. The aortic valve is tricuspid. Aortic valve regurgitation is trivial.  Mild aortic valve sclerosis is present, with no evidence of aortic valve  stenosis.  5. The inferior vena cava is normal in size with greater than 50%  respiratory variability, suggesting right atrial pressure of 3 mmHg.   Vascular Venous Duplex: Date: 08/02/20 Results: Summary:  Bilateral:  - No evidence of deep vein thrombosis seen in the lower extremities,  bilaterally, from the common femoral through the mid posterior tibial and  peroneal veins.  - No evidence of deep venous insufficiency seen bilaterally in the lower  extremity.  - No evidence of superficial venous reflux seen in the greater saphenous  veins bilaterally.  - No evidence of superficial venous reflux seen in the short saphenous  veins bilaterally.    Right:  - Fluid seen around the posterior aspect of tibia and joint space. No  Baker's cyst seen.     Left:  - Fluid seen around the posterior aspect of tibia and joint space. No  Baker's cyst seen.    Recent Labs: 05/08/2020: NT-Pro BNP 41 05/15/2020: Magnesium 2.1 08/01/2020: BUN 11; Creatinine, Ser 0.82; Hemoglobin 13.2; Platelets 239; Potassium 3.7; Sodium 142  Recent Lipid Panel No results found for: CHOL, TRIG, HDL, CHOLHDL, VLDL, LDLCALC, LDLDIRECT  Outside Hospital  or Outside Clinic Studies (OSH):  Date: 04/30/20 Cholesterol 100 HDL40 LDL 43 Tgs 78 Creatinine 0.8 TSH NA BNP NA (see above)   Risk Assessment/Calculations:     None  Physical Exam:    VS:  BP (!) 152/90   Pulse 78   Ht 5\' 7"  (1.702 m)   Wt 211 lb 9.6 oz (96 kg)   SpO2 96%   BMI 33.14 kg/m     Wt Readings from Last 3 Encounters:  08/10/20 211 lb 9.6  oz (96 kg)  07/31/20 214 lb (97.1 kg)  07/16/20 216 lb (98 kg)    GEN: Well nourished, well developed in no acute distress HEENT: Normal NECK: No JVD; No carotid bruits LYMPHATICS: No lymphadenopathy CARDIAC: RRR, no murmurs, rubs, gallops RESPIRATORY:  Clear to auscultation without rales, wheezing or rhonchi  ABDOMEN: Soft, non-tender, non-distended MUSCULOSKELETAL:  Compression stocking on but no edema; No deformity  SKIN: Warm and dry NEUROLOGIC:  Alert and oriented x 3 PSYCHIATRIC:  Normal affect   ASSESSMENT:    1. Chest pain of uncertain etiology   2. Primary hypertension   3. Chronic heart failure with preserved ejection fraction (HCC)   4. 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 - lasix 20 mg PO daily - BB: Continue atenolol 100  mg - start valsartan 160 mg daily; will get BMP in one week; could start spironolactone as next medication pending labs - will send to Pharm D clinic for additional support - Strict I/Os, daily weights, and fluid restriction of < 2 L  - ambulatory blood pressure above goal, will contine ambulatory BP monitoring; gave education on how to perform ambulatory blood pressure monitoring including the frequency and technique; goal ambulatory blood pressure < 135/85 on average - discussed diet (DASH/low sodium), and exercise/weight loss interventions  (patient has decreased salt intake)  Lower extremity Edema (bilateral)  - improved with stopped norvasc, lasix 20 mg PO Daily and compression stockings  Two month follow up unless new symptoms or abnormal test results warranting change in plan  Time Spent Directly with Patient:   I have spent a total of 40 minutes with the patient reviewing  notes,  EKGs, labs and examining the patient as well as establishing an assessment and plan that was discussed personally with the patient.  > 50% of time was spent in direct patient care- discussing with patient and daugther.  Would be reasonable for  APP Follow up   Shared Decision Making/Informed Consent     Medication Adjustments/Labs and Tests Ordered: Current medicines are reviewed at length with the patient today.  Concerns regarding medicines are outlined above.  Orders Placed This Encounter  Procedures  . CT CORONARY MORPH W/CTA COR W/SCORE W/CA W/CM &/OR WO/CM  . CT CORONARY FRACTIONAL FLOW RESERVE DATA PREP  . CT CORONARY FRACTIONAL FLOW RESERVE FLUID ANALYSIS  . Basic metabolic panel  . Basic metabolic panel  . AMB Referral to [facility]   Meds ordered this encounter  Medications  . valsartan (DIOVAN) 160 MG tablet    Sig: Take 1 tablet (160 mg total) by mouth daily.    Dispense:  30 tablet    Refill:  2  . ivabradine (CORLANOR) 5 MG TABS tablet    Sig: Take 2 hours prior to  Cardiac CT    Dispense:  1 tablet    Refill:  0    Patient Instructions  Medication Instructions:  Your physician recommends that you continue on your current medications as directed. Please refer to the Current Medication list given to you today. Prescription for Valsartan sent to [facility] in [place]  [facility] *If you need a refill on your cardiac medications before your next appointment, please call your pharmacy*   Lab Work: IN 1 WEEK: BMP If you have labs (blood work) drawn today and your tests are completely normal, you will receive your results only by: Marland Kitchen MyChart Message (if you have MyChart) OR . A paper copy in the mail If you have any lab test that is abnormal or we need to change your treatment, we will call you to review the results.   Testing/Procedures: Your physician has requested that you have cardiac CT. Cardiac computed tomography (CT) is a painless test that uses an x-ray machine to take clear, detailed pictures of your heart.  Please follow instruction sheet as given.      Follow-Up: At [facility], you and your health needs are our priority.  As part of our continuing mission to provide you with exceptional heart care, we have created designated Provider Care Teams.  These Care Teams include your primary Cardiologist (physician) and Advanced Practice Providers (APPs -  Physician Assistants and Nurse Practitioners) who all work together to provide you with the care you need, when you need it.  We recommend signing up for the patient portal called "MyChart".  Sign up information is provided on this After Visit Summary.  MyChart is used to connect with patients for Virtual Visits (Telemedicine).  Patients are able to view lab/test results, encounter notes, upcoming appointments, etc.  Non-urgent messages can be sent to your provider as well.   To learn more about what you can do with MyChart, go to [email or web address].    Your next appointment:   6-8  week(s)  The format for your next appointment:   In Person  Provider:   You may see Christell Constant, MD or one of the following Advanced Practice Providers on your designated Care Team:    Ronie Spies, PA-C  Jacolyn Reedy, PA-C    Other Instructions  Your cardiac CT will be scheduled at one of the below locations:   [facility] [address] [place], [place] [address] [phone]   Please arrive at the [facility] main entrance (entrance A) of [facility] 30 minutes prior to test start time. Proceed to the [facility] Radiology Department (first floor) to check-in and test prep.   Please follow these instructions carefully (unless otherwise directed):   On the Night Before the Test: . Be sure to Drink plenty of water. . Do not consume any caffeinated/decaffeinated beverages or chocolate 12 hours prior to your test. . Do not take any antihistamines (singular) 12 hours prior to your test.   On the Day of the Test: . Drink plenty of water until 1 hour prior to the test. . Do not eat any food 4 hours prior to the test. . You may take your regular medications prior to the test.  . Take ivabradine (Corlanor) 15mg   two hours prior to test. . HOLD Furosemide morning of the test. . FEMALES- please wear underwire-free bra if available          After the Test: . Drink plenty of water. . After receiving IV contrast, you may experience a mild flushed feeling. This is normal. . On occasion, you may experience a mild rash up to 24 hours after the test. This is not dangerous. If this occurs, you can take Benadryl 25 mg and increase your fluid intake. . If you experience trouble breathing, this can be serious. If  it is severe call 911 IMMEDIATELY. If it is mild, please call our office. . If you take any of these medications: Glipizide please do not take 48 hours after completing test unless otherwise instructed.   Once we have confirmed  authorization from your insurance company, we will call you to set up a date and time for your test. Based on how quickly your insurance processes prior authorizations requests, please allow up to 4 weeks to be contacted for scheduling your Cardiac CT appointment. Be advised that routine Cardiac CT appointments could be scheduled as many as 8 weeks after your provider has ordered it.  For non-scheduling related questions, please contact the cardiac imaging nurse navigator should you have any questions/concerns: Rockwell Alexandria, Cardiac Imaging Nurse Navigator Larey Brick, Cardiac Imaging Nurse Navigator [facility] Direct Office Dial: [phone]   For scheduling needs, including cancellations and rescheduling, please call [place], [phone].       Signed, Christell Constant, MD  08/10/2020 9:52 AM    [facility]

## 2020-08-10 NOTE — Patient Instructions (Addendum)
Medication Instructions:  Your physician recommends that you continue on your current medications as directed. Please refer to the Current Medication list given to you today. Prescription for Valsartan sent to [facility] in [facility] *If you need a refill on your cardiac medications before your next appointment, please call your pharmacy*   Lab Work: IN 1 WEEK: BMP If you have labs (blood work) drawn today and your tests are completely normal, you will receive your results only by: Marland Kitchen MyChart Message (if you have MyChart) OR . A paper copy in the mail If you have any lab test that is abnormal or we need to change your treatment, we will call you to review the results.   Testing/Procedures: Your physician has requested that you have cardiac CT. Cardiac computed tomography (CT) is a painless test that uses an x-ray machine to take clear, detailed pictures of your heart.  Please follow instruction sheet as given.      Follow-Up: At [facility], you and your health needs are our priority.  As part of our continuing mission to provide you with exceptional heart care, we have created designated Provider Care Teams.  These Care Teams include your primary Cardiologist (physician) and Advanced Practice Providers (APPs -  Physician Assistants and Nurse Practitioners) who all work together to provide you with the care you need, when you need it.  We recommend signing up for the patient portal called "MyChart".  Sign up information is provided on this After Visit Summary.  MyChart is used to connect with patients for Virtual Visits (Telemedicine).  Patients are able to view lab/test results, encounter notes, upcoming appointments, etc.  Non-urgent messages can be sent to your provider as well.   To learn more about what you can do with MyChart, go to [email or web address].    Your next appointment:   6-8 week(s)  The format for your next appointment:   In Person  Provider:    You may see Christell Constant, MD or one of the following Advanced Practice Providers on your designated Care Team:    Ronie Spies, PA-C  Jacolyn Reedy, PA-C    Other Instructions  Your cardiac CT will be scheduled at one of the below locations:   [facility] [address] [place], [place] [address] [phone]   Please arrive at the [facility] main entrance (entrance A) of [facility] 30 minutes prior to test start time. Proceed to the [facility] Radiology Department (first floor) to check-in and test prep.   Please follow these instructions carefully (unless otherwise directed):   On the Night Before the Test: . Be sure to Drink plenty of water. . Do not consume any caffeinated/decaffeinated beverages or chocolate 12 hours prior to your test. . Do not take any antihistamines (singular) 12 hours prior to your test.   On the Day of the Test: . Drink plenty of water until 1 hour prior to the test. . Do not eat any food 4 hours prior to the test. . You may take your regular medications prior to the test.  . Take ivabradine (Corlanor) 15mg   two hours prior to test. . HOLD Furosemide morning of the test. . FEMALES- please wear underwire-free bra if available          After the Test: . Drink plenty of water. . After receiving IV contrast, you may experience a mild flushed feeling. This is normal. . On occasion, you may experience a mild rash up  to 24 hours after the test. This is not dangerous. If this occurs, you can take Benadryl 25 mg and increase your fluid intake. . If you experience trouble breathing, this can be serious. If it is severe call 911 IMMEDIATELY. If it is mild, please call our office. . If you take any of these medications: Glipizide please do not take 48 hours after completing test unless otherwise instructed.   Once we have confirmed authorization from your insurance company, we will call you to set up a date and time  for your test. Based on how quickly your insurance processes prior authorizations requests, please allow up to 4 weeks to be contacted for scheduling your Cardiac CT appointment. Be advised that routine Cardiac CT appointments could be scheduled as many as 8 weeks after your provider has ordered it.  For non-scheduling related questions, please contact the cardiac imaging nurse navigator should you have any questions/concerns: Rockwell Alexandria, Cardiac Imaging Nurse Navigator Larey Brick, Cardiac Imaging Nurse Navigator [place] [facility] Direct Office Dial: [phone]   For scheduling needs, including cancellations and rescheduling, please call [place], [phone].

## 2020-08-14 ENCOUNTER — Other Ambulatory Visit: Payer: Self-pay

## 2020-08-14 ENCOUNTER — Ambulatory Visit (HOSPITAL_COMMUNITY)
Admission: RE | Admit: 2020-08-14 | Discharge: 2020-08-14 | Disposition: A | Discharge status: Home / Self Care | Payer: Medicare Other | Source: Ambulatory Visit | Attending: Internal Medicine | Admitting: Internal Medicine

## 2020-08-14 DIAGNOSIS — I739 Peripheral vascular disease, unspecified: Secondary | ICD-10-CM | POA: Diagnosis not present

## 2020-08-16 ENCOUNTER — Other Ambulatory Visit: Payer: Self-pay

## 2020-08-16 ENCOUNTER — Telehealth (HOSPITAL_COMMUNITY): Payer: Self-pay | Admitting: Emergency Medicine

## 2020-08-16 DIAGNOSIS — R079 Chest pain, unspecified: Secondary | ICD-10-CM

## 2020-08-16 MED ORDER — IVABRADINE HCL 5 MG PO TABS: ORAL_TABLET | ORAL | 0 refills | Status: DC

## 2020-08-16 MED ORDER — ALPRAZOLAM 0.25 MG PO TABS: 0.2500 mg | ORAL_TABLET | Freq: Once | ORAL | 0 refills | Status: AC

## 2020-08-16 NOTE — Telephone Encounter (Signed)
Reaching out to patient to offer assistance regarding upcoming cardiac imaging study; pt verbalizes understanding of appt date/time, parking situation and where to check in, pre-test NPO status and medications ordered, and verified current allergies; name and call back number provided for further questions should they arise Donna Alexandria RN Navigator Cardiac Imaging [facility] [phone] office [phone] cell   Pt instructed to take 7.5mg  ivabradine 2 hr prior to scan Pt reports severe claustro and so 0.25mg  xanax prescribed and sent to her pharm. (daughter is driving patient to/from appt) Donna Garner

## 2020-08-16 NOTE — Progress Notes (Signed)
Dr. Izora Ribas has ordered 7.5mg  of ivabradine for Cardiac CT.  Med obtained from pharmacist patient will take 1.5 tablets.

## 2020-08-17 ENCOUNTER — Other Ambulatory Visit: Payer: Self-pay

## 2020-08-17 ENCOUNTER — Other Ambulatory Visit: Payer: Medicare Other | Admitting: *Deleted

## 2020-08-17 ENCOUNTER — Other Ambulatory Visit: Payer: Self-pay | Admitting: Physical Medicine and Rehabilitation

## 2020-08-17 DIAGNOSIS — I10 Essential (primary) hypertension: Secondary | ICD-10-CM

## 2020-08-17 DIAGNOSIS — R079 Chest pain, unspecified: Secondary | ICD-10-CM

## 2020-08-17 DIAGNOSIS — F419 Anxiety disorder, unspecified: Secondary | ICD-10-CM | POA: Diagnosis not present

## 2020-08-17 DIAGNOSIS — M5459 Other low back pain: Secondary | ICD-10-CM | POA: Diagnosis not present

## 2020-08-17 LAB — BASIC METABOLIC PANEL
BUN/Creatinine Ratio: 14 (ref 12–28)
BUN: 13 mg/dL (ref 8–27)
CO2: 20 mmol/L (ref 20–29)
Calcium: 10.1 mg/dL (ref 8.7–10.3)
Chloride: 105 mmol/L (ref 96–106)
Creatinine, Ser: 0.96 mg/dL (ref 0.57–1.00)
Glucose: 153 mg/dL — ABNORMAL HIGH (ref 65–99)
Potassium: 4.2 mmol/L (ref 3.5–5.2)
Sodium: 141 mmol/L (ref 134–144)
eGFR: 62 mL/min/{1.73_m2} (ref >59)

## 2020-08-19 ENCOUNTER — Telehealth: Payer: Self-pay | Admitting: Student in an Organized Health Care Education/Training Program

## 2020-08-19 NOTE — Telephone Encounter (Signed)
Chest pain/burning in throat. Checked BP and it was 189/106. Usually BP runs 140-150s/80s. Donna Garner is still have burning sensation under R breast. Belching some and having some gas. She takes protonix for GERD. This is similar in character but more severe. Every time she drinks water it comes back up, brown/thick with mucus. She denies nausea or emesis. She is reporting both chest pain and burning sensation. It comes/goes but when it comes it can be severe, 7/10 severity. Feels pain underneath R breast and center of chest. Denies radiation to arms/neck. Sx onset around 1200 today. She has not taken anything to alleviate her sx, tried ginger tea with no relief. Also feels constipated. Coronary angio in 2019 was reportedly normal. Chest discomfort comes/goes. Not related to exertion. Does not have SLNG or additional reflux meds at home.   Recommended she seek further evaluation in ED for r/o of cardiac ischemia given her sx and RFs. Daughter is agreeable.

## 2020-08-20 ENCOUNTER — Encounter (HOSPITAL_COMMUNITY): Payer: Self-pay

## 2020-08-20 ENCOUNTER — Ambulatory Visit (HOSPITAL_COMMUNITY)
Admission: RE | Admit: 2020-08-20 | Discharge: 2020-08-20 | Disposition: A | Discharge status: Home / Self Care | Payer: Medicare Other | Source: Ambulatory Visit | Attending: Internal Medicine | Admitting: Internal Medicine

## 2020-08-20 ENCOUNTER — Telehealth: Payer: Self-pay | Admitting: Internal Medicine

## 2020-08-20 ENCOUNTER — Other Ambulatory Visit: Payer: Self-pay

## 2020-08-20 DIAGNOSIS — I10 Essential (primary) hypertension: Secondary | ICD-10-CM | POA: Insufficient documentation

## 2020-08-20 DIAGNOSIS — R079 Chest pain, unspecified: Secondary | ICD-10-CM | POA: Insufficient documentation

## 2020-08-20 DIAGNOSIS — R931 Abnormal findings on diagnostic imaging of heart and coronary circulation: Secondary | ICD-10-CM | POA: Insufficient documentation

## 2020-08-20 DIAGNOSIS — I700 Atherosclerosis of aorta: Secondary | ICD-10-CM | POA: Diagnosis not present

## 2020-08-20 DIAGNOSIS — K449 Diaphragmatic hernia without obstruction or gangrene: Secondary | ICD-10-CM | POA: Insufficient documentation

## 2020-08-20 DIAGNOSIS — I2510 Atherosclerotic heart disease of native coronary artery without angina pectoris: Secondary | ICD-10-CM | POA: Diagnosis not present

## 2020-08-20 IMAGING — CT CT HEART MORP W/ CTA COR W/ SCORE W/ CA W/CM &/OR W/O CM
4 of 7 series · 8 of 20 positions shown, 9 images · non-contrast
Comparison: Chest radiographs including [DATE]
COMPARISON: Chest radiographs including [DATE]

Addendum:
EXAM:
OVER-READ INTERPRETATION  CT CHEST

The following report is an over-read performed by radiologist Dr.
MECHEN [REDACTED] on [DATE]. This over-read
does not include interpretation of cardiac or coronary anatomy or
pathology. The coronary CTA interpretation by the cardiologist is
attached.
CLINICAL DATA: 74 Year-old African American Female
Cardiac/Coronary  CTA
TECHNIQUE: The patient was scanned on a [employer] Force scanner.

[Series 6: best diast 75 % · axial · 0.39mm/px · z∈[+1221,+1260]mm · 2 of 296 slices shown, 3 images]
[im 99/296  vessel]
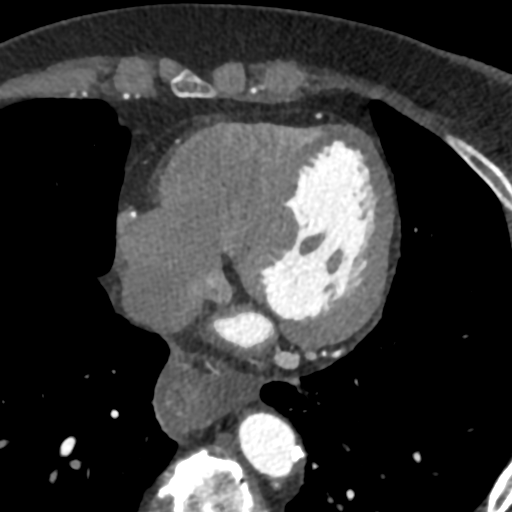
[im 99/296  lung]
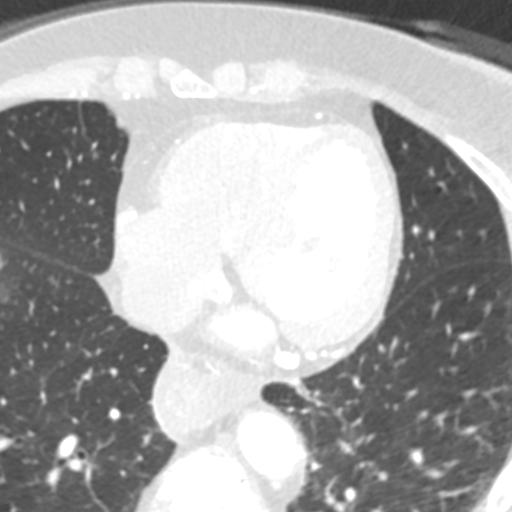
[im 197/296  vessel]
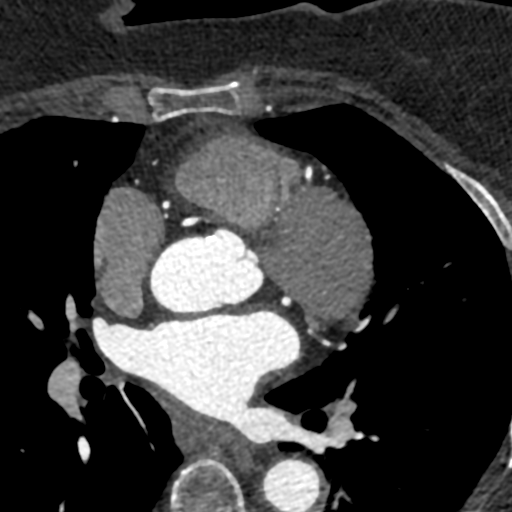

[Series 7: best syst 40 % · axial · 0.39mm/px · z∈[+1221,+1260]mm · 2 of 296 slices shown]
[im 99/296  vessel]
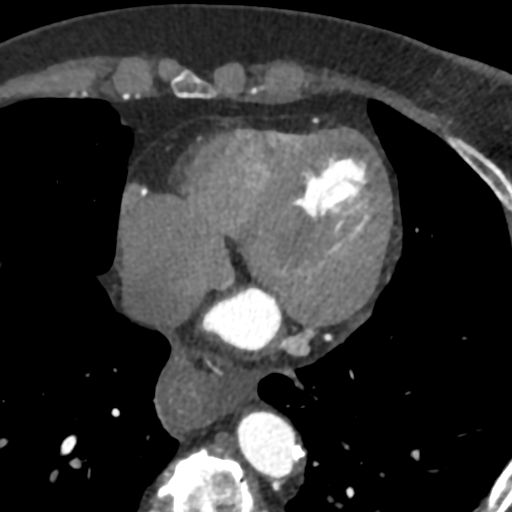
[im 197/296  vessel]
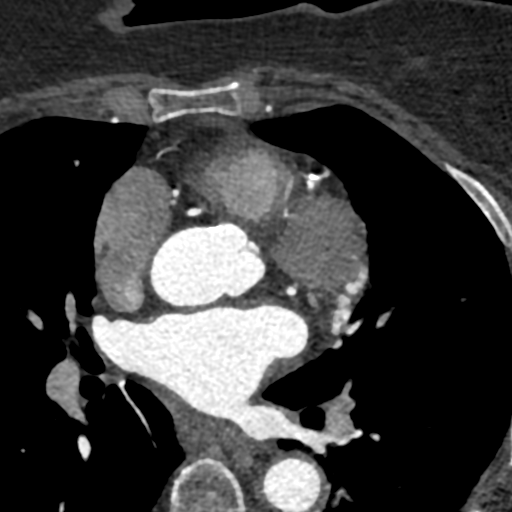

[Series 8: ts diast sharp 75 % · axial · 0.39mm/px · z∈[+1221,+1260]mm · 2 of 296 slices shown]
[im 99/296  lung]
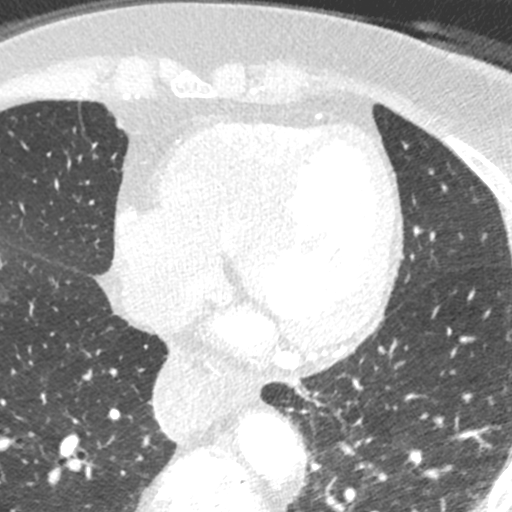
[im 197/296  lung]
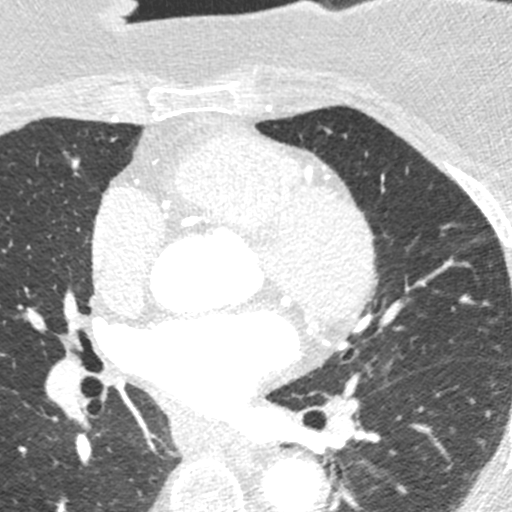

[Series 9: ts syst sharp 40 % · axial · 0.39mm/px · z∈[+1221,+1260]mm · 2 of 296 slices shown]
[im 99/296  lung]
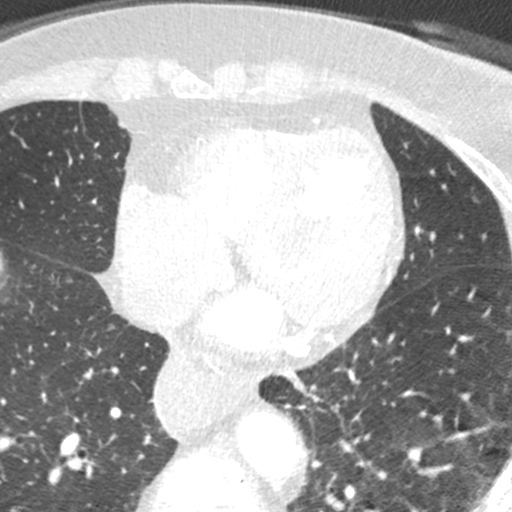
[im 197/296  lung]
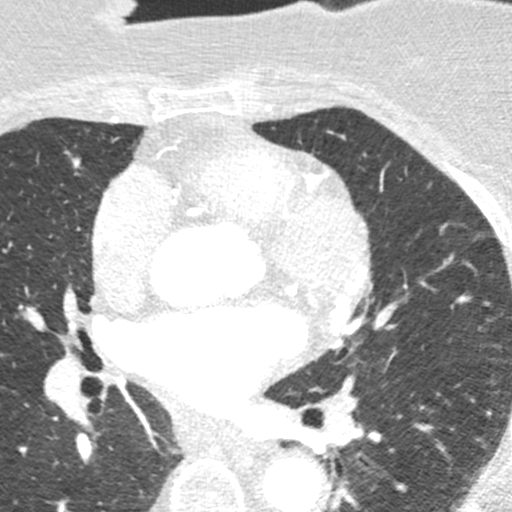

[8 of 20 positions shown; findings below may reference images not displayed]

FINDINGS: Vascular: Aortic atherosclerosis. No central pulmonary embolism, on
this non-dedicated study.

Mediastinum/Nodes: No imaged thoracic adenopathy. Small to moderate
hiatal hernia.

Lungs/Pleura: No pleural fluid.  Clear imaged lungs.

Upper Abdomen: Normal imaged portions of the liver, spleen.

Musculoskeletal: Right breast 7 mm partially calcified nodule on
[DATE]. Lower thoracic spondylosis.
IMPRESSION: 1.  No acute findings in the imaged extracardiac chest.
2.  Aortic Atherosclerosis ([4F]-[4F]).
3. 7 mm partially calcified nonspecific right-sided breast nodule.
Consider correlation with mammogram and possibly ultrasound.
4. Hiatal hernia.

These results will be called to the ordering clinician or
representative by the Radiologist Assistant, and communication
documented in the PACS or [REDACTED].
FINDINGS: A 100 kV prospective scan was triggered in the descending thoracic
aorta at 111 HU's. Axial non-contrast 3 mm slices were carried out
through the heart. The data set was analyzed on a dedicated work
station and scored using the Agatson method. Gantry rotation speed
was 250 msecs and collimation was .6 mm. No beta blockade and 0.8 mg
of sl NTG was given. The 3D data set was reconstructed in 5%
intervals of the 67-82 % of the R-R cycle. Diastolic phases were
analyzed on a dedicated work station using MPR, MIP and VRT modes.
The patient received 80 cc of contrast.

Aorta:  Normal size.  Aortic atherosclerosis noted.  No dissection.

Aortic Valve:  Tri-leaflet.  No calcifications.

Coronary Arteries:  Normal coronary origin.  Right dominance.

Coronary calcium score of 255. This was 83rd percentile for age,
sex, and race matched control.

RCA is a large dominant artery that gives rise to PDA and PLA. There
is a minimal non-obstructive calcified plaque (1-24%) in the
proximal vessel. There is a minimal non-obstructive calcified plaque
(1-24%) in the mid vessel.

Left main is a large artery that gives rise to LAD and LCX arteries.
There is no plaque.

LAD is a large vessel that gives rise to two diagonal vessels with a
D1 bifurcation. There is a mild non-obstructive calcified plaque
(25-49%) in the proximal vessel. There is a moderate stenosis
calcified plaque (50-70%) in the mid vessel.

LCX is a non-dominant artery that gives rise to one OM1 branch and
bifurcates to a OM2 vessel. There is a moderate stenosis calcified
plaque (50-70%) in the proximal vessel. There is a mild
non-obstructive calcified plaque (25-50%) in the mid vessel.

Other findings:

Normal pulmonary vein drainage into the left atrium.

Normal left atrial appendage without a thrombus.

Upper limit of normal pulmonary artery: 36 mm.

Extra-cardiac findings: See attached radiology report for
non-cardiac structures.
IMPRESSION: 1. Coronary calcium score of 255. This was 83rd percentile for age,
sex, and race matched control.

2. Normal coronary origin.  Right dominance.

3. CAD-RADS 3. Moderate stenosis in the LAD and LCX. Consider
symptom-guided anti-ischemic pharmacotherapy as well as risk factor
modification per guideline directed care. Additional analysis with
CT FFR will be submitted.

4. Aortic atherosclerosis noted.

5. Upper limit of normal pulmonary artery: 36 mm.

*** End of Addendum ***
EXAM:
OVER-READ INTERPRETATION  CT CHEST

The following report is an over-read performed by radiologist Dr.
MECHEN [REDACTED] on [DATE]. This over-read
does not include interpretation of cardiac or coronary anatomy or
pathology. The coronary CTA interpretation by the cardiologist is
attached.
FINDINGS: Vascular: Aortic atherosclerosis. No central pulmonary embolism, on
this non-dedicated study.

Mediastinum/Nodes: No imaged thoracic adenopathy. Small to moderate
hiatal hernia.

Lungs/Pleura: No pleural fluid.  Clear imaged lungs.

Upper Abdomen: Normal imaged portions of the liver, spleen.

Musculoskeletal: Right breast 7 mm partially calcified nodule on
[DATE]. Lower thoracic spondylosis.
IMPRESSION: 1.  No acute findings in the imaged extracardiac chest.
2.  Aortic Atherosclerosis ([4F]-[4F]).
3. 7 mm partially calcified nonspecific right-sided breast nodule.
Consider correlation with mammogram and possibly ultrasound.
4. Hiatal hernia.

These results will be called to the ordering clinician or
representative by the Radiologist Assistant, and communication
documented in the PACS or [REDACTED].

## 2020-08-20 MED ORDER — IOHEXOL 350 MG/ML SOLN
80.0000 mL | Freq: Once | INTRAVENOUS | Status: AC | PRN
  Administered 2020-08-20: 80 mL via INTRAVENOUS

## 2020-08-20 MED ORDER — NITROGLYCERIN 0.4 MG SL SUBL
SUBLINGUAL_TABLET | SUBLINGUAL | Status: AC
  Filled 2020-08-20: qty 2

## 2020-08-20 MED ORDER — NITROGLYCERIN 0.4 MG SL SUBL
0.8000 mg | SUBLINGUAL_TABLET | Freq: Once | SUBLINGUAL | Status: AC
  Administered 2020-08-20: 0.8 mg via SUBLINGUAL

## 2020-08-20 NOTE — Telephone Encounter (Signed)
Over read shows:  7 mm partially calcified nonspecific right-sided breast nodule. Consider correlation with mammogram and possibly ultrasound.

## 2020-08-20 NOTE — Telephone Encounter (Signed)
Erskine Squibb with [facility] calling to give report of the ct

## 2020-08-21 ENCOUNTER — Telehealth: Payer: Self-pay

## 2020-08-21 NOTE — Telephone Encounter (Signed)
Left message for patient to call back for test results.

## 2020-08-21 NOTE — Telephone Encounter (Signed)
Spoke with patient went over results of Cardiac CT.  I explained to her the incidental finding of a place on her right breast.  She expressed that she hasn't had a mammogram since 2020 due to Covid and [place] a new [place].  Dr. Izora Ribas as recommended that she have a [place] of right breast.  Pt would prefer a mammogram.  Sent message to MD for advisement.

## 2020-08-21 NOTE — Telephone Encounter (Signed)
-----   Message from Christell Constant, MD sent at 08/21/2020  1:01 PM EDT ----- Results: Negative CT-FFR Incidental Breast Finding Plan: LDL at goal No evidence of obstructive CAD Given Call in for HTN and CP; will add labetalol 100 mg PO BID for HTN Will get R Breast US and send to Primary MD for further evaluation  Christell Constant, MD

## 2020-08-21 NOTE — Telephone Encounter (Signed)
-----   Message from Mahesh A Chandrasekhar, MD sent at 08/21/2020  1:01 PM EDT ----- Results: Negative CT-FFR Incidental Breast Finding Plan: LDL at goal No evidence of obstructive CAD Given Call in for HTN and CP; will add labetalol 100 mg PO BID for HTN Will get R Breast US and send to Primary MD for further evaluation  Mahesh A Chandrasekhar, MD  

## 2020-08-21 NOTE — Telephone Encounter (Signed)
Patient returning call to discuss results.

## 2020-08-23 ENCOUNTER — Ambulatory Visit: Payer: Medicare Other

## 2020-08-23 NOTE — Telephone Encounter (Signed)
Called  PCP office Garth Bigness, MD) spoke with BJ  regarding incidental finding during Cardiac CT.  I inquired having PCP follow up on finding and scheduling mammogram.  I routed report over to office.  Told to call our office with any questions/ concerns.  I called patient and let her know that report was sent over to PCP office and they will f/u and make sure she gets a mammogram.  She thanked me for the call.  I told her to call the office with any questions/ concerns.

## 2020-08-28 ENCOUNTER — Encounter: Payer: Self-pay | Admitting: Physician Assistant

## 2020-08-28 ENCOUNTER — Encounter: Payer: Self-pay | Admitting: Internal Medicine

## 2020-08-28 ENCOUNTER — Other Ambulatory Visit: Payer: Self-pay

## 2020-08-28 ENCOUNTER — Telehealth: Payer: Self-pay | Admitting: Internal Medicine

## 2020-08-28 DIAGNOSIS — Z0000 Encounter for general adult medical examination without abnormal findings: Secondary | ICD-10-CM | POA: Diagnosis not present

## 2020-08-28 DIAGNOSIS — E785 Hyperlipidemia, unspecified: Secondary | ICD-10-CM | POA: Diagnosis not present

## 2020-08-28 DIAGNOSIS — E1169 Type 2 diabetes mellitus with other specified complication: Secondary | ICD-10-CM | POA: Diagnosis not present

## 2020-08-28 DIAGNOSIS — I10 Essential (primary) hypertension: Secondary | ICD-10-CM

## 2020-08-28 MED ORDER — SPIRONOLACTONE 25 MG PO TABS: 25.0000 mg | ORAL_TABLET | Freq: Every day | ORAL | 3 refills | Status: DC

## 2020-08-28 MED ORDER — ATENOLOL 100 MG PO TABS: 100.0000 mg | ORAL_TABLET | Freq: Every day | ORAL | 3 refills | Status: DC

## 2020-08-28 NOTE — Telephone Encounter (Signed)
Lauris Poag is calling stating Ronelle has decided she would like to start the BP medication that starts with a "L" that was previously being discussed with Shamea. Please advise.

## 2020-08-28 NOTE — Progress Notes (Signed)
DC'd corlanor this was a one time medication to be taken 2 hours prior to Cardiac CT.

## 2020-08-28 NOTE — Telephone Encounter (Signed)
Called patient notified her and daughter of medication changes per Dr. Izora Ribas.  Stop: Potassium Start Spironolactone 25 mg PO QD Continue: Atenolol 100 mg PO OD BMP: in 1 week  She is agreeable to this plan of care.

## 2020-08-29 ENCOUNTER — Other Ambulatory Visit: Payer: Self-pay | Admitting: Family Medicine

## 2020-08-30 ENCOUNTER — Other Ambulatory Visit: Payer: Self-pay | Admitting: Family Medicine

## 2020-08-30 DIAGNOSIS — R921 Mammographic calcification found on diagnostic imaging of breast: Secondary | ICD-10-CM

## 2020-09-03 DIAGNOSIS — H1045 Other chronic allergic conjunctivitis: Secondary | ICD-10-CM | POA: Diagnosis not present

## 2020-09-03 DIAGNOSIS — J3089 Other allergic rhinitis: Secondary | ICD-10-CM | POA: Diagnosis not present

## 2020-09-03 DIAGNOSIS — J4530 Mild persistent asthma, uncomplicated: Secondary | ICD-10-CM | POA: Diagnosis not present

## 2020-09-03 DIAGNOSIS — J309 Allergic rhinitis, unspecified: Secondary | ICD-10-CM | POA: Diagnosis not present

## 2020-09-05 ENCOUNTER — Other Ambulatory Visit: Payer: Self-pay

## 2020-09-05 ENCOUNTER — Other Ambulatory Visit: Payer: Medicare Other | Admitting: *Deleted

## 2020-09-05 ENCOUNTER — Telehealth: Payer: Self-pay

## 2020-09-05 DIAGNOSIS — I10 Essential (primary) hypertension: Secondary | ICD-10-CM

## 2020-09-05 LAB — BASIC METABOLIC PANEL
BUN/Creatinine Ratio: 17 (ref 12–28)
BUN: 15 mg/dL (ref 8–27)
CO2: 27 mmol/L (ref 20–29)
Calcium: 9.9 mg/dL (ref 8.7–10.3)
Chloride: 106 mmol/L (ref 96–106)
Creatinine, Ser: 0.89 mg/dL (ref 0.57–1.00)
Glucose: 113 mg/dL — ABNORMAL HIGH (ref 65–99)
Potassium: 4 mmol/L (ref 3.5–5.2)
Sodium: 143 mmol/L (ref 134–144)
eGFR: 68 mL/min/{1.73_m2} (ref >59)

## 2020-09-05 NOTE — Telephone Encounter (Signed)
Patient dropped off written BP readings to front desk.  Gave to Medical Records to scan into the system.

## 2020-09-07 NOTE — Telephone Encounter (Signed)
Virl Cagey L 20 minutes ago (9:00 AM)   JG    Follow up:     Patient daughter calling to check the status of thr BP reading they sent in.      Spoke with the patient's daughter and advised her that Dr. Debby Bud nurse has received the BP readings. Advised that she is out of the office today but she will call back next week if there needs to be any changes made based off of the readings.

## 2020-09-07 NOTE — Telephone Encounter (Signed)
Follow up:     Patient daughter calling to check the status of thr BP reading they sent in.

## 2020-09-11 DIAGNOSIS — M1711 Unilateral primary osteoarthritis, right knee: Secondary | ICD-10-CM | POA: Diagnosis not present

## 2020-09-14 ENCOUNTER — Telehealth: Payer: Self-pay

## 2020-09-14 DIAGNOSIS — I10 Essential (primary) hypertension: Secondary | ICD-10-CM | POA: Diagnosis not present

## 2020-09-14 NOTE — Telephone Encounter (Signed)
Patient was returning Shamea's call

## 2020-09-14 NOTE — Telephone Encounter (Signed)
-----   Message from Christell Constant, MD sent at 09/13/2020  5:14 PM EDT ----- Regarding: RE: BP readings We are running out of medications to get her to goal. She is on a diuretic, MRA, non selective BB Had leg swelling with CCB. Will start hydralazine 25 mg PO BID and continue to drive down her BP.  Thanks, Mahesh   ----- Message ----- From: Macie Burows, RN Sent: 09/12/2020   4:06 PM EDT To: Christell Constant, MD Subject: BP readings                                    Hi,  When you get a little time could you review BP readings this patient sent in. The readings are under media tab.  Her daughter called in wanting to know if changes are needed.  She is the patient we discussed during clinic time and had to make changes.  On 08/28/20 you stopped her potassium and started spironolactone 25 mg QD.  I ended up calling her at the end of the day after we regrouped, just to help you remember her.

## 2020-09-14 NOTE — Telephone Encounter (Signed)
Called and spoke with patient.  She does not want to start hydralazine 25 mg PO BID suggested by Dr. Izora Ribas.  She expressed that she is tired of taking all these different medicines.  She wants to see how BP readings are on the increased dose of spironolactone (50 mg) prescribed by PCP.  I advised her to monitor BP for 1 week and see how they trend.  She will call the office to decide whether or not she will take suggested med.

## 2020-09-14 NOTE — Telephone Encounter (Signed)
Nurse for PCP Donna Garner called the office and informed me that patient had the following symptoms: SOB, CP, Chest tightness, and elevated BP.  Initial BP 173/89 2nd check BP 154/96.  PCP increased spironolactone to 50 mg PO QD.  PCP will fax progress notes over.  PCP office will check labs next week and send results over to our office.   Donna Garner is aware and wants to continue with the plan of starting her on hydralazine 25 mg PO BID.  Called patient to inform her of Donna Garner recommendations message left to call the office.

## 2020-09-19 ENCOUNTER — Telehealth: Payer: Self-pay

## 2020-09-19 NOTE — Telephone Encounter (Signed)
Called patient to see how BP's are running.  Patient reports she feels better.  Last BP per pt 119/79-70.  She expressed that her BP is really coming down with increase in spironolactone.  No longer SBP in 140's and 150's.  She reports she will be going for lab work with her PCP soon.  She thanked me for call.

## 2020-09-19 NOTE — Telephone Encounter (Signed)
-----   Message from Mahesh A Chandrasekhar, MD sent at 09/13/2020  5:14 PM EDT ----- Regarding: RE: BP readings We are running out of medications to get her to goal. She is on a diuretic, MRA, non selective BB Had leg swelling with CCB. Will start hydralazine 25 mg PO BID and continue to drive down her BP.  Thanks, Mahesh   ----- Message ----- From: Auston Halfmann N, RN Sent: 09/12/2020   4:06 PM EDT To: Mahesh A Chandrasekhar, MD Subject: BP readings                                    Hi,  When you get a little time could you review BP readings this patient sent in. The readings are under media tab.  Her daughter called in wanting to know if changes are needed.  She is the patient we discussed during clinic time and had to make changes.  On 08/28/20 you stopped her potassium and started spironolactone 25 mg QD.  I ended up calling her at the end of the day after we regrouped, just to help you remember her.    

## 2020-09-20 DIAGNOSIS — I10 Essential (primary) hypertension: Secondary | ICD-10-CM | POA: Diagnosis not present

## 2020-09-25 NOTE — Progress Notes (Signed)
Cardiology Office Note    Date:  10/03/2020   ID:  Donna Garner, Barnhardt [birth year], MRN [id number]   PCP:  Shon Hale, MD   [facility]  Cardiologist:  Christell Constant, MD  Advanced Practice Provider:  No care team member to display Electrophysiologist:  None   [id number]}   Chief Complaint  Patient presents with  . Follow-up    History of Present Illness:  Donna Garner is a 75 y.o. female with history of hypertension, DM,HFpEF and LVH in [place] followed by Dr. [facility] on evaluation 05/08/2020.  We have requested records but have not received them.  She said Dr. Honor Junes and started on Valsartan but never did because her pharmacy was out. Patient was having chest pain and CCTA 08/20/2020 coronary calcium score 255 83rd percentile for age sex and race matched control, moderate stenosis in the LAD and circumflex, FFR no significant stenosis.  And started on spironolactone.  Patient comes in accompanied by her daughter. Denies chest pain, dyspnea, dizziness or edema. BP much better controlled. Complains of back and knee pain. Had MRI last week and waiting for results. Trying to watch salt.   Past Medical History:  Diagnosis Date  . Asthma   . Cardiomegaly   . CHF (congestive heart failure) (HCC)   . Cough   . Diabetes (HCC)   . Dyspnea   . History of swelling of feet   . Hypertension   . SOB (shortness of breath)   . Swelling of ankle     No past surgical history on file.  Current Medications: Current Meds  Medication Sig  . aspirin EC 81 MG tablet Take 81 mg by mouth daily. Swallow whole.  Marland Kitchen atenolol (TENORMIN) 100 MG tablet Take 1 tablet (100 mg total) by mouth daily.  . calcium-vitamin D (OSCAL WITH D) 500-200 MG-UNIT tablet Take 1 tablet by mouth.  . furosemide (LASIX) 20 MG tablet Take 1 tablet (20 mg total) by mouth daily.  Marland Kitchen glimepiride (AMARYL) 2 MG tablet Take 2 mg by mouth 2 (two)  times daily.  Marland Kitchen guaiFENesin (MUCINEX) 600 MG 12 hr tablet Take by mouth 2 (two) times daily as needed for cough or to loosen phlegm.  . mometasone-formoterol (DULERA) 100-5 MCG/ACT AERO Inhale 2 puffs into the lungs 2 (two) times daily.  . montelukast (SINGULAIR) 10 MG tablet Take 10 mg by mouth daily.  . pantoprazole (PROTONIX) 40 MG tablet Take 40 mg by mouth daily.  . rosuvastatin (CRESTOR) 10 MG tablet Take 10 mg by mouth daily.  Marland Kitchen spironolactone (ALDACTONE) 50 MG tablet Take 1 tablet (50 mg total) by mouth daily.  . valsartan (DIOVAN) 160 MG tablet Take 1 tablet (160 mg total) by mouth daily.  . [DISCONTINUED] spironolactone (ALDACTONE) 25 MG tablet Take 1 tablet (25 mg total) by mouth daily. (Patient taking differently: Take 50 mg by mouth daily.)     Allergies:   Glipizide, Hydroxychloroquine, Ibuprofen, Metformin, Amlodipine, Metformin and related, Saline nasal spray, and Zyrtec [cetirizine]   Social History   Socioeconomic History  . Marital status: Legally Separated    Spouse name: Not on file  . Number of children: Not on file  . Years of education: Not on file  . Highest education level: Not on file  Occupational History  . Not on file  Tobacco Use  . Smoking status: Former Games developer  . Smokeless tobacco: Never Used  Substance and Sexual Activity  .  Alcohol use: Never  . Drug use: Never  . Sexual activity: Not on file  Other Topics Concern  . Not on file  Social History Narrative  . Not on file   Social Determinants of Health   Financial Resource Strain: Not on file  Food Insecurity: Not on file  Transportation Needs: Not on file  Physical Activity: Not on file  Stress: Not on file  Social Connections: Not on file     Family History:  The patient's   family history includes Heart disease in her brother and sister.   ROS:   Please see the history of present illness.    ROS All other systems reviewed and are negative.   PHYSICAL EXAM:   VS:  BP 132/76    Pulse 68   Ht 5' 6.5" (1.689 m)   Wt 210 lb (95.3 kg)   SpO2 99%   BMI 33.39 kg/m   Physical Exam  GEN: Obese, in no acute distress  Neck: no JVD, carotid bruits, or masses Cardiac:RRR; no murmurs, rubs, or gallops  Respiratory:  clear to auscultation bilaterally, normal work of breathing GI: soft, nontender, nondistended, + BS Ext: without cyanosis, clubbing, or edema, Good distal pulses bilaterally Neuro:  Alert and Oriented x 3 Psych: euthymic mood, full affect  Wt Readings from Last 3 Encounters:  10/03/20 210 lb (95.3 kg)  08/10/20 211 lb 9.6 oz (96 kg)  07/31/20 214 lb (97.1 kg)      Studies/Labs Reviewed:   EKG:  EKG is not ordered today.   Recent Labs: 05/08/2020: NT-Pro BNP 41 05/15/2020: Magnesium 2.1 08/01/2020: Hemoglobin 13.2; Platelets 239 09/05/2020: BUN 15; Creatinine, Ser 0.89; Potassium 4.0; Sodium 143   Lipid Panel No results found for: CHOL, TRIG, HDL, CHOLHDL, VLDL, LDLCALC, LDLDIRECT  Additional studies/ records that were reviewed today include:  Coronary CTA 08/20/2020  IMPRESSION: 1. Coronary calcium score of 255. This was 83rd percentile for age, sex, and race matched control.   2. Normal coronary origin.  Right dominance.   3. CAD-RADS 3. Moderate stenosis in the LAD and LCX. Consider symptom-guided anti-ischemic pharmacotherapy as well as risk factor modification per guideline directed care. Additional analysis with CT FFR will be submitted.   4. Aortic atherosclerosis noted.   5. Upper limit of normal pulmonary artery: 36 mm.     Electronically Signed   By: Riley Lam MD   On: 08/20/2020 15:20   FINDINGS: CT-FFR analysis was performed on the original cardiac CT angiogram dataset. Diagrammatic representation of the CT-FFR analysis is provided in a separate PDF document in PACS. This dictation was created using the PDF document and an interactive 3D model of the results. 3D model is not available in the EMR/PACS. Normal  FFR range is >0.80.   1. Left Main: No significant functional stenosis, CT-FFR 0.99 distal vessel.   2. LAD: No significant functional stenosis, CT-FFR 0.88 distal vessel. 3. LCX: No significant functional stenosis, CT-FFR 0.91 distal vessel. 4. RCA: No significant functional stenosis, CT-FFR 0.86 distal vessel.   IMPRESSION: 1. CT FFR analysis shows no evidence of significant functional stenosis.   Riley Lam MD     Electronically Signed   By: Riley Lam MD   On: 08/21/2020 12:57   Transthoracic Echocardiogram: Date: 06/05/20 Results: Basal Septal Hypertrophy without SAM or evidence of LVOT gradient 1. Left ventricular ejection fraction, by estimation, is 65 to 70%. Left  ventricular ejection fraction by 3D volume is 69 %. The left  ventricle has  normal function. The left ventricle has no regional wall motion  abnormalities. There is severe left  ventricular hypertrophy of the basal-septal segment. Left ventricular  diastolic parameters are consistent with Grade I diastolic dysfunction  (impaired relaxation). Elevated left ventricular end-diastolic pressure.  The average left ventricular global  longitudinal strain is -17.7 %. The global longitudinal strain is normal.   2. Right ventricular systolic function is normal. The right ventricular  size is normal. There is normal pulmonary artery systolic pressure. The  estimated right ventricular systolic pressure is 29.6 mmHg.   3. The mitral valve is normal in structure. Mild mitral valve  regurgitation. No evidence of mitral stenosis.   4. The aortic valve is tricuspid. Aortic valve regurgitation is trivial.  Mild aortic valve sclerosis is present, with no evidence of aortic valve  stenosis.   5. The inferior vena cava is normal in size with greater than 50%  respiratory variability, suggesting right atrial pressure of 3 mmHg.    Vascular Venous Duplex: Date: 08/02/20 Results: Summary:  Bilateral:   - No evidence of deep vein thrombosis seen in the lower extremities,  bilaterally, from the common femoral through the mid posterior tibial and  peroneal veins.  - No evidence of deep venous insufficiency seen bilaterally in the lower  extremity.  - No evidence of superficial venous reflux seen in the greater saphenous  veins bilaterally.  - No evidence of superficial venous reflux seen in the short saphenous  veins bilaterally.     Right:  - Fluid seen around the posterior aspect of tibia and joint space. No  Baker's cyst seen.     Left:  - Fluid seen around the posterior aspect of tibia and joint space. No  Baker's cyst seen.    Risk Assessment/Calculations:         ASSESSMENT:    1. Chest pain, unspecified type   2. Coronary artery disease involving native coronary artery of native heart without angina pectoris   3. Chronic heart failure with preserved ejection fraction (HCC)   4. Diabetes mellitus with coincident hypertension (HCC)   5. Lower extremity edema      PLAN:  In order of problems listed above:  Chest pain coronary CTA and FFR calcium score to 55 moderate stenosis in the LAD and circumflex but FFR no significant stenosis.  Risk factor modification recommended.  Hypertension doing better on increased spironolactone, valsartan and lasix. BMET by PCP 09/20/20 stable. .2 gm sodium diet  HFpEF compensated  DM managed by PCP. On a statin  LE Edema improved with stopping norvasc and low dose lasix/compression stockings.  Shared Decision Making/Informed Consent        Medication Adjustments/Labs and Tests Ordered: Current medicines are reviewed at length with the patient today.  Concerns regarding medicines are outlined above.  Medication changes, Labs and Tests ordered today are listed in the Patient Instructions below. Patient Instructions   Medication Instructions:  Your physician recommends that you continue on your current medications as directed.  Please refer to the Current Medication list given to you today.  *If you need a refill on your cardiac medications before your next appointment, please call your pharmacy*   Lab Work: None ordered   If you have labs (blood work) drawn today and your tests are completely normal, you will receive your results only by: Marland Kitchen. MyChart Message (if you have MyChart) OR . A paper copy in the mail If you have any lab test that  is abnormal or we need to change your treatment, we will call you to review the results.   Testing/Procedures: None ordered   Follow-Up: At [facility], you and your health needs are our priority.  As part of our continuing mission to provide you with exceptional heart care, we have created designated Provider Care Teams.  These Care Teams include your primary Cardiologist (physician) and Advanced Practice Providers (APPs -  Physician Assistants and Nurse Practitioners) who all work together to provide you with the care you need, when you need it.  We recommend signing up for the patient portal called "MyChart".  Sign up information is provided on this After Visit Summary.  MyChart is used to connect with patients for Virtual Visits (Telemedicine).  Patients are able to view lab/test results, encounter notes, upcoming appointments, etc.  Non-urgent messages can be sent to your provider as well.   To learn more about what you can do with MyChart, go to [email or web address].    Your next appointment:   6 month(s)  The format for your next appointment:   In Person  Provider:   You may see Christell Constant, MD or one of the following Advanced Practice Providers on your designated Care Team:    Ronie Spies, PA-C  Jacolyn Reedy, PA-C    Other Instructions Two Gram Sodium Diet 2000 mg  What is Sodium? Sodium is a mineral found naturally in many foods. The most significant source of sodium in the diet is table salt, which is about 40% sodium.  Processed,  convenience, and preserved foods also contain a large amount of sodium.  The body needs only 500 mg of sodium daily to function,  A normal diet provides more than enough sodium even if you do not use salt.  Why Limit Sodium? A build up of sodium in the body can cause thirst, increased blood pressure, shortness of breath, and water retention.  Decreasing sodium in the diet can reduce edema and risk of heart attack or stroke associated with high blood pressure.  Keep in mind that there are many other factors involved in these health problems.  Heredity, obesity, lack of exercise, cigarette smoking, stress and what you eat all play a role.  General Guidelines:  Do not add salt at the table or in cooking.  One teaspoon of salt contains over 2 grams of sodium.  Read food labels  Avoid processed and convenience foods  Ask your dietitian before eating any foods not dicussed in the menu planning guidelines  Consult your physician if you wish to use a salt substitute or a sodium containing medication such as antacids.  Limit milk and milk products to 16 oz (2 cups) per day.  Shopping Hints:  READ LABELS!! "Dietetic" does not necessarily mean low sodium.  Salt and other sodium ingredients are often added to foods during processing.   Menu Planning Guidelines Food Group Choose More Often Avoid  Beverages (see also the milk group All fruit juices, low-sodium, salt-free vegetables juices, low-sodium carbonated beverages Regular vegetable or tomato juices, commercially softened water used for drinking or cooking  Breads and Cereals Enriched white, wheat, rye and pumpernickel bread, hard rolls and dinner rolls; muffins, cornbread and waffles; most dry cereals, cooked cereal without added salt; unsalted crackers and breadsticks; low sodium or homemade bread crumbs Bread, rolls and crackers with salted tops; quick breads; instant hot cereals; pancakes; commercial bread stuffing; self-rising flower and  biscuit mixes; regular bread crumbs or cracker crumbs  Desserts  and Sweets Desserts and sweets mad with mild should be within allowance Instant pudding mixes and cake mixes  Fats Butter or margarine; vegetable oils; unsalted salad dressings, regular salad dressings limited to 1 Tbs; light, sour and heavy cream Regular salad dressings containing bacon fat, bacon bits, and salt pork; snack dips made with instant soup mixes or processed cheese; salted nuts  Fruits Most fresh, frozen and canned fruits Fruits processed with salt or sodium-containing ingredient (some dried fruits are processed with sodium sulfites        Vegetables Fresh, frozen vegetables and low- sodium canned vegetables Regular canned vegetables, sauerkraut, pickled vegetables, and others prepared in brine; frozen vegetables in sauces; vegetables seasoned with ham, bacon or salt pork  Condiments, Sauces, Miscellaneous  Salt substitute with physician's approval; pepper, herbs, spices; vinegar, lemon or lime juice; hot pepper sauce; garlic powder, onion powder, low sodium soy sauce (1 Tbs.); low sodium condiments (ketchup, chili sauce, mustard) in limited amounts (1 tsp.) fresh ground horseradish; unsalted tortilla chips, pretzels, potato chips, popcorn, salsa (1/4 cup) Any seasoning made with salt including garlic salt, celery salt, onion salt, and seasoned salt; sea salt, rock salt, kosher salt; meat tenderizers; monosodium glutamate; mustard, regular soy sauce, barbecue, sauce, chili sauce, teriyaki sauce, steak sauce, Worcestershire sauce, and most flavored vinegars; canned gravy and mixes; regular condiments; salted snack foods, olives, picles, relish, horseradish sauce, catsup   Food preparation: Try these seasonings Meats:    Pork Sage, onion Serve with applesauce  Chicken Poultry seasoning, thyme, parsley Serve with cranberry sauce  Lamb Curry powder, rosemary, garlic, thyme Serve with mint sauce or jelly  Veal Marjoram, basil  Serve with current jelly, cranberry sauce  Beef Pepper, bay leaf Serve with dry mustard, unsalted chive butter  Fish [place] leaf, dill Serve with unsalted lemon butter, unsalted parsley butter  Vegetables:    Asparagus Lemon juice   Broccoli Lemon juice   Carrots Mustard dressing parsley, mint, nutmeg, glazed with unsalted butter and sugar   Green beans Marjoram, lemon juice, nutmeg,dill seed   Tomatoes Basil, marjoram, onion   Spice /blend for [employer]" 4 tsp ground thyme 1 tsp ground sage 3 tsp ground rosemary 4 tsp ground marjoram   Test your knowledge 1. A product that says "Salt Free" may still contain sodium. True or False 2. Garlic Powder and Hot Pepper Sauce an be used as alternative seasonings.True or False 3. Processed foods have more sodium than fresh foods.  True or False 4. Canned Vegetables have less sodium than froze True or False  WAYS TO DECREASE YOUR SODIUM INTAKE 1. Avoid the use of added salt in cooking and at the table.  Table salt (and other prepared seasonings which contain salt) is probably one of the greatest sources of sodium in the diet.  Unsalted foods can gain flavor from the sweet, sour, and butter taste sensations of herbs and spices.  Instead of using salt for seasoning, try the following seasonings with the foods listed.  Remember: how you use them to enhance natural food flavors is limited only by your creativity... Allspice-Meat, fish, eggs, fruit, peas, red and yellow vegetables Almond Extract-Fruit baked goods Anise Seed-Sweet breads, fruit, carrots, beets, cottage cheese, cookies (tastes like licorice) Basil-Meat, fish, eggs, vegetables, rice, vegetables salads, soups, sauces Bay Leaf-Meat, fish, stews, poultry Burnet-Salad, vegetables (cucumber-like flavor) Caraway Seed-Bread, cookies, cottage cheese, meat, vegetables, cheese, rice Cardamon-Baked goods, fruit, soups Celery Powder or seed-Salads, salad dressings, sauces, meatloaf, soup, bread.Do not  use  celery salt Chervil-Meats, salads, fish, eggs, vegetables, cottage cheese (parsley-like flavor) Chili Power-Meatloaf, chicken cheese, corn, eggplant, egg dishes Chives-Salads cottage cheese, egg dishes, soups, vegetables, sauces Cilantro-Salsa, casseroles Cinnamon-Baked goods, fruit, pork, lamb, chicken, carrots Cloves-Fruit, baked goods, fish, pot roast, green beans, beets, carrots Coriander-Pastry, cookies, meat, salads, cheese (lemon-orange flavor) Cumin-Meatloaf, fish,cheese, eggs, cabbage,fruit pie (caraway flavor) [employer], fruit, eggs, fish, poultry, cottage cheese, vegetables Dill Seed-Meat, cottage cheese, poultry, vegetables, fish, salads, bread Fennel Seed-Bread, cookies, apples, pork, eggs, fish, beets, cabbage, cheese, Licorice-like flavor Garlic-(buds or powder) Salads, meat, poultry, fish, bread, butter, vegetables, potatoes.Do not  use garlic salt Ginger-Fruit, vegetables, baked goods, meat, fish, poultry Horseradish Root-Meet, vegetables, butter Lemon Juice or Extract-Vegetables, fruit, tea, baked goods, fish salads Mace-Baked goods fruit, vegetables, fish, poultry (taste like nutmeg) Maple Extract-Syrups Marjoram-Meat, chicken, fish, vegetables, breads, green salads (taste like Sage) Mint-Tea, lamb, sherbet, vegetables, desserts, carrots, cabbage Mustard, Dry or Seed-Cheese, eggs, meats, vegetables, poultry Nutmeg-Baked goods, fruit, chicken, eggs, vegetables, desserts Onion Powder-Meat, fish, poultry, vegetables, cheese, eggs, bread, rice salads (Do not use   Onion salt) Orange Extract-Desserts, baked goods Oregano-Pasta, eggs, cheese, onions, pork, lamb, fish, chicken, vegetables, green salads Paprika-Meat, fish, poultry, eggs, cheese, vegetables Parsley Flakes-Butter, vegetables, meat fish, poultry, eggs, bread, salads (certain forms may   Contain sodium Pepper-Meat fish, poultry, vegetables, eggs Peppermint Extract-Desserts, baked goods Poppy  Seed-Eggs, bread, cheese, fruit dressings, baked goods, noodles, vegetables, cottage  [employer], poultry, meat, fish, cauliflower, turnips,eggs bread Saffron-Rice, bread, veal, chicken, fish, eggs Sage-Meat, fish, poultry, onions, eggplant, tomateos, pork, stews Savory-Eggs, salads, poultry, meat, rice, vegetables, soups, pork Tarragon-Meat, poultry, fish, eggs, butter, vegetables (licorice-like flavor)  Thyme-Meat, poultry, fish, eggs, vegetables, (clover-like flavor), sauces, soups Tumeric-Salads, butter, eggs, fish, rice, vegetables (saffron-like flavor) Vanilla Extract-Baked goods, candy Vinegar-Salads, vegetables, meat marinades Walnut Extract-baked goods, candy  2. Choose your Foods Wisely   The following is a list of foods to avoid which are high in sodium:  Meats-Avoid all smoked, canned, salt cured, dried and kosher meat and fish as well as Anchovies   [employer] meats:Bologna, Liverwurst, Pastrami Canned meat or fish  Marinated herring Caviar    Pepperoni Corned Beef   Pizza Dried chipped beef  Salami Frozen breaded fish or meat Salt pork Frankfurters or hot dogs  Sardines Gefilte fish   Sausage Ham (boiled ham, Proscuitto Smoked butt    spiced ham)   Spam      TV Dinners Vegetables Canned vegetables (Regular) Relish Canned mushrooms  Sauerkraut Olives    Tomato juice Pickles  Bakery and Dessert Products Canned puddings  Cream pies Cheesecake   Decorated cakes Cookies  Beverages/Juices Tomato juice, regular  Gatorade   V-8 vegetable juice, regular  Breads and Cereals Biscuit mixes   Salted potato chips, corn chips, pretzels Bread stuffing mixes  Salted crackers and rolls Pancake and waffle mixes Self-rising flour  Seasonings Accent    Meat sauces Barbecue sauce  Meat tenderizer Catsup    Monosodium glutamate (MSG) Celery salt   Onion salt Chili sauce   Prepared mustard Garlic salt   Salt, seasoned  salt, sea salt Gravy mixes   Soy sauce Horseradish   Steak sauce Ketchup   Tartar sauce Lite salt    Teriyaki sauce Marinade mixes   Worcestershire sauce  Others Baking powder   Cocoa and cocoa mixes Baking soda   Commercial casserole mixes Candy-caramels, chocolate  Dehydrated soups    Bars, fudge,nougats  Instant  rice and pasta mixes Canned broth or soup  Maraschino cherries Cheese, aged and processed cheese and cheese spreads  Learning Assessment Quiz  Indicated T (for True) or F (for False) for each of the following statements:  1. _____ Fresh fruits and vegetables and unprocessed grains are generally low in sodium 2. _____ Water may contain a considerable amount of sodium, depending on the source 3. _____ You can always tell if a food is high in sodium by tasting it 4. _____ Certain laxatives my be high in sodium and should be avoided unless prescribed   by a physician or pharmacist 5. _____ Salt substitutes may be used freely by anyone on a sodium restricted diet 6. _____ Sodium is present in table salt, food additives and as a natural component of   most foods 7. _____ Table salt is approximately 90% sodium 8. _____ Limiting sodium intake may help prevent excess fluid accumulation in the body 9. _____ On a sodium-restricted diet, seasonings such as bouillon soy sauce, and    cooking wine should be used in place of table salt 10. _____ On an ingredient list, a product which lists monosodium glutamate as the first   ingredient is an appropriate food to include on a low sodium diet  Circle the best answer(s) to the following statements (Hint: there may be more than one correct answer)  11. On a low-sodium diet, some acceptable snack items are:    A. Olives  F. Bean dip   K. Grapefruit juice    B. Salted Pretzels G. Commercial Popcorn   L. Canned peaches    C. Carrot Sticks  H. Bouillon   M. Unsalted nuts   D. [place] fries  I. Peanut butter crackers N. Salami   E. Sweet  pickles J. Tomato Juice   O. Pizza  12.  Seasonings that may be used freely on a reduced - sodium diet include   A. Lemon wedges F.Monosodium glutamate K. Celery seed    B.Soysauce   G. Pepper   L. Mustard powder   C. Sea salt  H. Cooking wine  M. Onion flakes   D. Vinegar  E. Prepared horseradish N. Salsa   E. Sage   J. Worcestershire sauce  O. [address]      Elson Clan, PA-C  10/03/2020 9:16 AM    [facility] [address] [place], [place], [place]  [address] Phone: [phone]; Fax: [phone]

## 2020-09-26 ENCOUNTER — Ambulatory Visit
Admission: RE | Admit: 2020-09-26 | Discharge: 2020-09-26 | Disposition: A | Discharge status: Home / Self Care | Payer: Medicare Other | Source: Ambulatory Visit | Attending: Physical Medicine and Rehabilitation | Admitting: Physical Medicine and Rehabilitation

## 2020-09-26 ENCOUNTER — Other Ambulatory Visit: Payer: Self-pay

## 2020-09-26 ENCOUNTER — Ambulatory Visit: Payer: Medicare Other | Admitting: Physician Assistant

## 2020-09-26 DIAGNOSIS — M48061 Spinal stenosis, lumbar region without neurogenic claudication: Secondary | ICD-10-CM | POA: Diagnosis not present

## 2020-09-26 DIAGNOSIS — M545 Low back pain, unspecified: Secondary | ICD-10-CM | POA: Diagnosis not present

## 2020-09-26 DIAGNOSIS — M5459 Other low back pain: Secondary | ICD-10-CM

## 2020-09-26 IMAGING — MR MR LUMBAR SPINE W/O CM
4 of 6 series · 16 of 48 positions shown · non-contrast
Comparison: No pertinent prior exams available for comparison.

CLINICAL DATA: Other low back pain. Additional history provided:

EXAM:
MRI LUMBAR SPINE WITHOUT CONTRAST
TECHNIQUE: Multiplanar, multisequence MR imaging of the lumbar spine was
performed. No intravenous contrast was administered.

[Series 6: T2 · sagittal · 4.0mm · 0.73mm/px · 5 of 16 slices shown (1 of 2)]
[im 1/16]
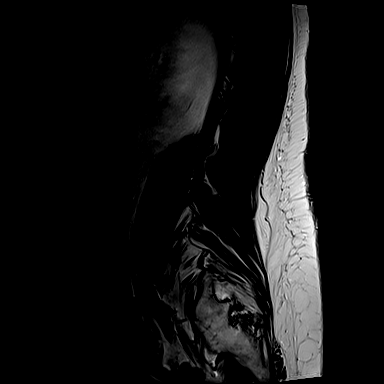
[im 4/16]
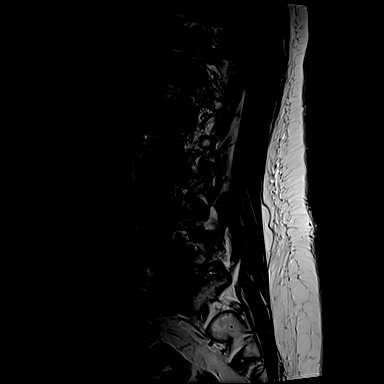
[im 8/16]
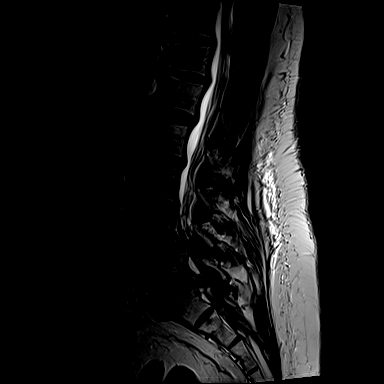
[im 12/16]
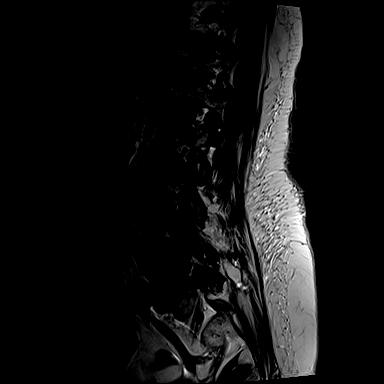
[im 16/16]
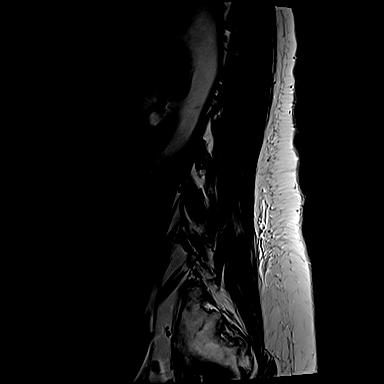

[Series 7: T1 · sagittal · 4.0mm · 0.73mm/px · 3 of 16 slices shown (1 of 2)]
[im 1/16]
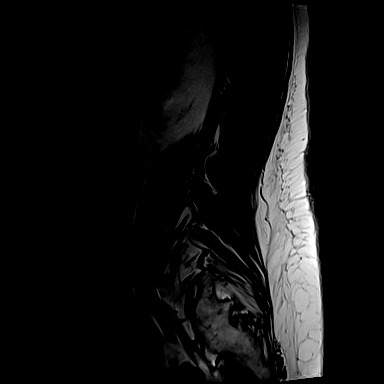
[im 8/16]
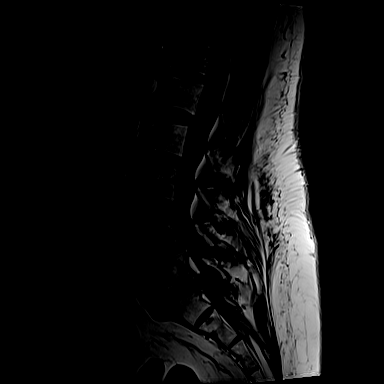
[im 16/16]
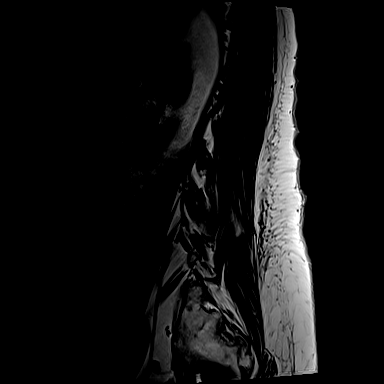

[Series 11: T1 · axial · 4.0mm · 0.28mm/px · z∈[-52,+121]mm · 3 of 39 slices shown (2 of 2)]
[im 4/39]
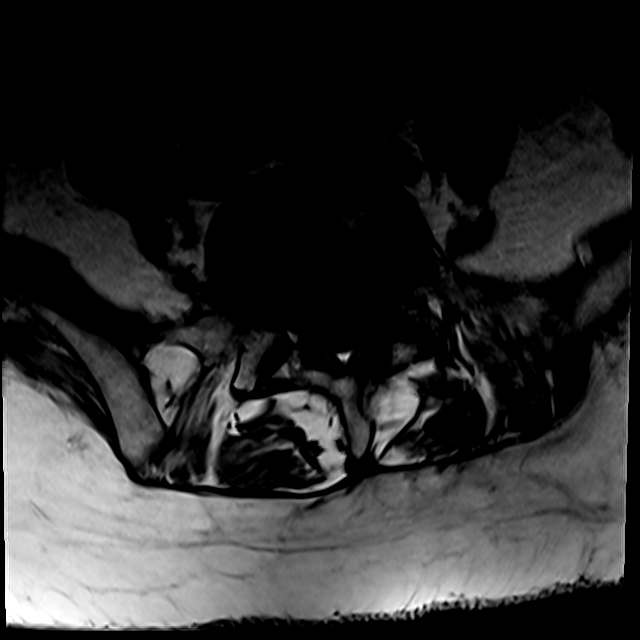
[im 20/39]
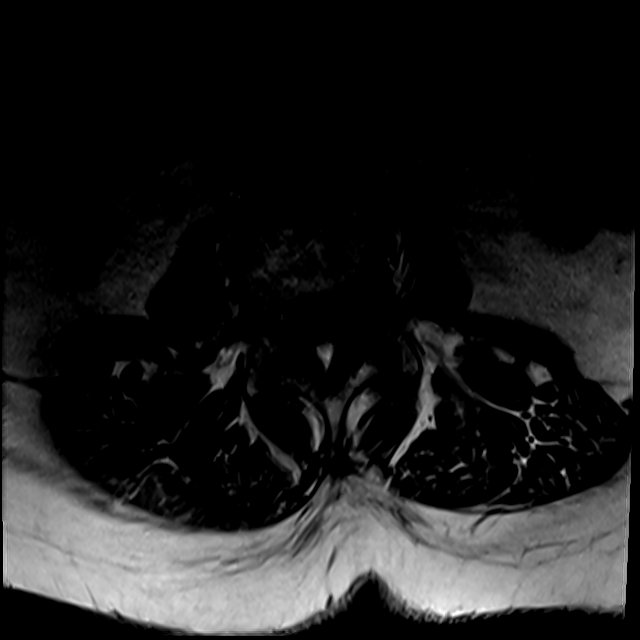
[im 35/39]
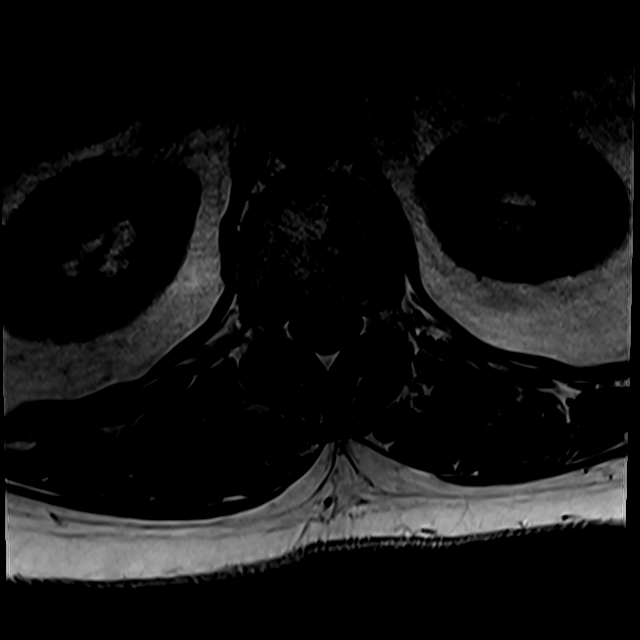

[Series 14: T2 · axial · 4.0mm · 0.28mm/px · z∈[-67,+121]mm · 5 of 39 slices shown (2 of 2)]
[im 1/39]
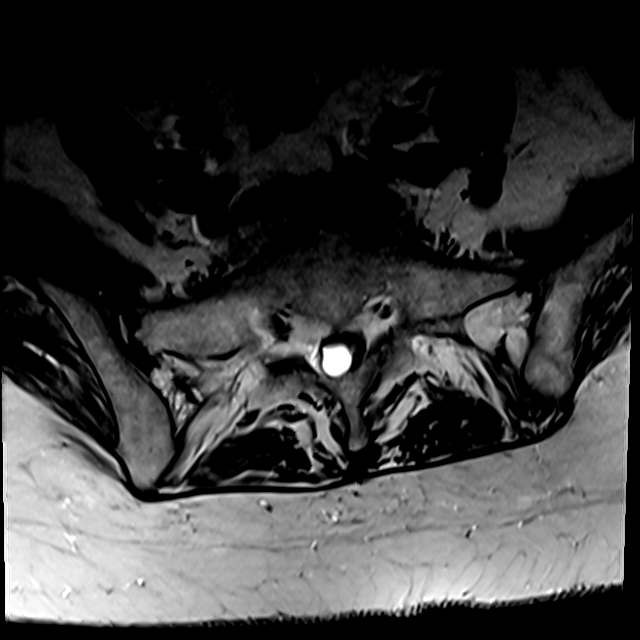
[im 4/39]
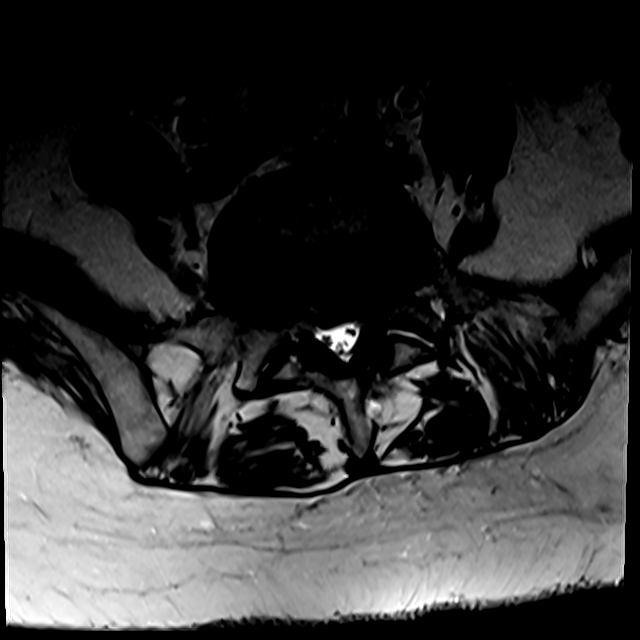
[im 8/39]
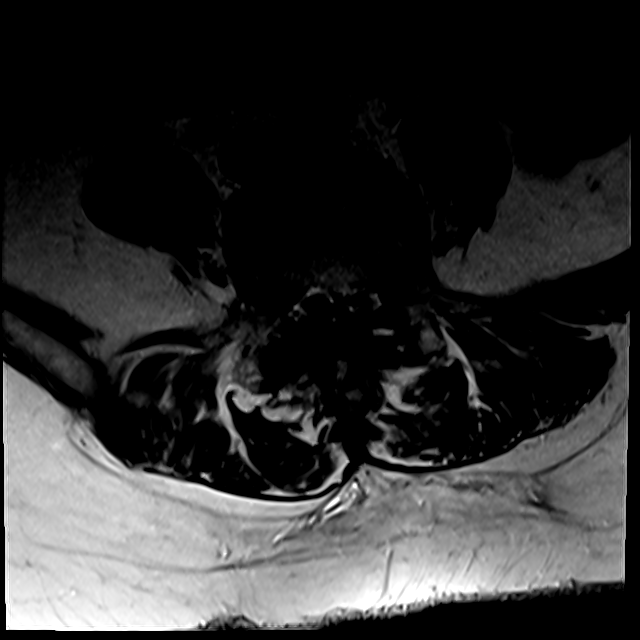
[im 20/39]
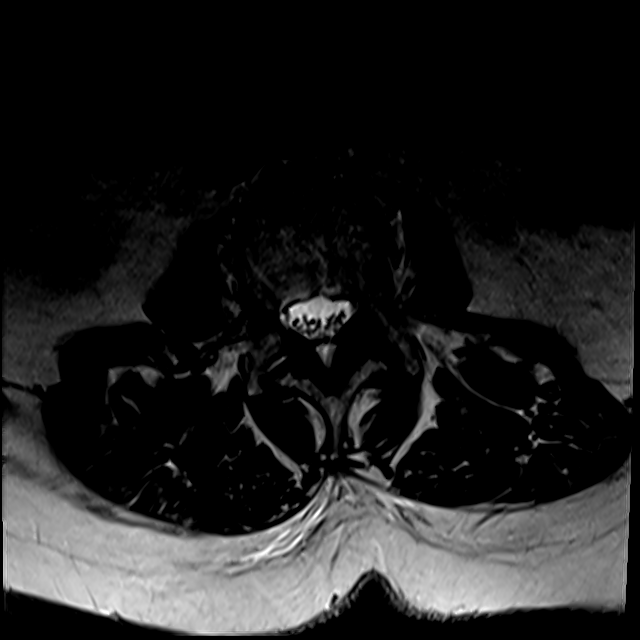
[im 35/39]
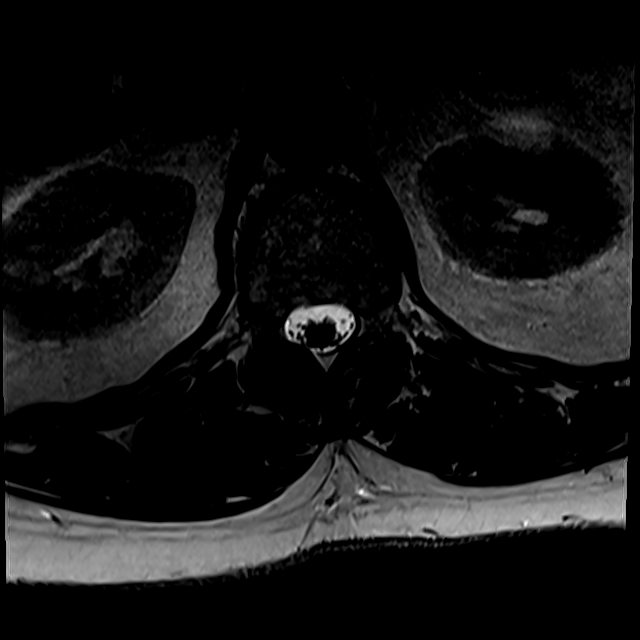

[16 of 48 positions shown; findings below may reference images not displayed]

FINDINGS: Segmentation: For the purposes of this dictation, five lumbar
vertebrae are assumed and the caudal most well-formed intervertebral
disc is designated L5-S1.

Alignment: Trace T11-T12 grade 1 anterolisthesis. Trace T12-L1 grade
1 anterolisthesis. Trace L1-L2 and L2-L3 grade 1 retrolisthesis. 5
mm L4-L5 grade 1 anterolisthesis.

Vertebrae: Vertebral body height is maintained. Multilevel
degenerative endplate irregularity greatest at L5-S1. Mild
degenerative endplate edema at L2-L3 and L5-S1. Elsewhere, no
significant marrow edema or focal suspicious osseous lesion is
identified.

Conus medullaris and cauda equina: Conus extends to the L2 level. No
signal abnormality within the visualized distal spinal cord.

Paraspinal and other soft tissues: Bilateral renal cysts. Atrophy of
the lumbar paraspinal musculature.

Disc levels:

Multilevel disc degeneration. Most notably, there is moderate disc
degeneration at L4-L5 and moderate/severe disc degeneration at
L5-S1.

T10-T11: Imaged sagittally. Shallow disc bulge. No significant
spinal canal stenosis. The neural foramina are incompletely imaged.

T11-T12: Imaged sagittally. Minimal disc uncovering and disc bulge.
Facet arthrosis/ligamentum flavum hypertrophy. No significant spinal
canal stenosis. Mild/moderate bilateral neural foraminal narrowing.

T12-L1: Small disc bulge. Mild facet arthrosis. No significant
spinal canal stenosis. Mild relative right neural foraminal
narrowing.

L1-L2: Small disc bulge. Mild facet arthrosis. No significant spinal
canal or foraminal stenosis.

L2-L3: Disc bulge. Mild facet arthrosis with ligamentum flavum
hypertrophy. Trace fluid within the bilateral facet joints. Mild
bilateral subarticular narrowing without frank nerve root
impingement. Central canal patent. Bilateral neural foraminal
narrowing (mild/moderate right, mild left).

L3-L4: Disc bulge. Mild facet arthrosis with ligamentum flavum
hypertrophy. Mild bilateral subarticular narrowing without frank
nerve root impingement. Central canal patent. Moderate bilateral
neural foraminal narrowing.

L4-L5: Grade 1 anterolisthesis. Disc uncovering with disc bulge.
Advanced facet arthrosis. Trace fluid within the left facet joint.
Severe central canal and bilateral subarticular stenosis with likely
cauda equina nerve root impingement. Redundancy of the cauda equina
nerve roots cephalad to this level. Moderate bilateral neural
foraminal narrowing.

L5-S1: Disc bulge with circumferential osteophyte ridge. A
superimposed small central disc extrusion with minimal cranial
migration is questioned (for instance as seen on series 6, image
10). Moderate facet arthrosis with ligamentum flavum hypertrophy.
Trace fluid within the left facet joint. Multifactorial
moderate/severe bilateral subarticular stenosis with encroachment
upon bilateral descending nerve roots (most notably the descending
S1 nerve roots). There is also encroachment upon additional
descending nerve roots more centrally within the anterior canal
(series 14, image 35). Overall moderate central canal stenosis.
Bilateral neural foraminal narrowing (moderate/severe right,
moderate left).
IMPRESSION: Lumbar spondylosis as outlined and with findings most notably as
follows.

At L4-L5, there is moderate disc degeneration. Grade 1
anterolisthesis. Disc uncovering with disc bulge. Advanced facet
arthrosis. Severe central canal and bilateral subarticular stenosis
with likely cauda equina nerve root impingement. Redundancy of the
cauda equina nerve roots cephalad to this level. Moderate bilateral
neural foraminal narrowing.

At L5-S1, there is severe disc degeneration. Disc bulge with
circumferential osteophyte ridge. A superimposed small central disc
extrusion with minimal cranial migration is questioned. Moderate
facet arthrosis with ligamentum flavum hypertrophy. Multifactorial
moderate/severe bilateral subarticular stenosis with encroachment
upon bilateral descending nerve roots (most notably the descending
S1 nerve roots). There is also encroachment upon additional
descending nerve roots more centrally within the anterior canal.
Overall moderate central canal stenosis. Bilateral neural foraminal
narrowing (moderate/severe right, moderate left).

No more than mild spinal canal stenosis at the remaining levels.
Additional sites of mild and moderate neural foraminal narrowing as
described

## 2020-09-30 DIAGNOSIS — J3089 Other allergic rhinitis: Secondary | ICD-10-CM | POA: Diagnosis not present

## 2020-10-03 ENCOUNTER — Ambulatory Visit (INDEPENDENT_AMBULATORY_CARE_PROVIDER_SITE_OTHER): Payer: Medicare Other | Admitting: Pharmacist

## 2020-10-03 ENCOUNTER — Encounter (INDEPENDENT_AMBULATORY_CARE_PROVIDER_SITE_OTHER): Payer: Self-pay

## 2020-10-03 ENCOUNTER — Ambulatory Visit: Payer: Medicare Other | Admitting: Physician Assistant

## 2020-10-03 ENCOUNTER — Encounter: Payer: Self-pay | Admitting: Physician Assistant

## 2020-10-03 ENCOUNTER — Other Ambulatory Visit: Payer: Self-pay

## 2020-10-03 VITALS — BP 132/76 | HR 68 | Ht 66.5 in | Wt 210.0 lb

## 2020-10-03 DIAGNOSIS — I2510 Atherosclerotic heart disease of native coronary artery without angina pectoris: Secondary | ICD-10-CM | POA: Diagnosis not present

## 2020-10-03 DIAGNOSIS — I10 Essential (primary) hypertension: Secondary | ICD-10-CM | POA: Insufficient documentation

## 2020-10-03 DIAGNOSIS — R079 Chest pain, unspecified: Secondary | ICD-10-CM | POA: Diagnosis not present

## 2020-10-03 DIAGNOSIS — I5032 Chronic diastolic (congestive) heart failure: Secondary | ICD-10-CM

## 2020-10-03 DIAGNOSIS — E119 Type 2 diabetes mellitus without complications: Secondary | ICD-10-CM | POA: Diagnosis not present

## 2020-10-03 DIAGNOSIS — R600 Localized edema: Secondary | ICD-10-CM

## 2020-10-03 MED ORDER — SPIRONOLACTONE 50 MG PO TABS: 50.0000 mg | ORAL_TABLET | Freq: Every day | ORAL | 3 refills | Status: DC

## 2020-10-03 NOTE — Progress Notes (Addendum)
Patient ID: Donna Garner                 DOB: 05-27-1946                      MRN: [id number]     HPI: Donna Garner is a 75 y.o. female referred by Dr. Izora Ribas to HTN clinic. PMH is significant for diabetes with HTN, Asthma, with history of HFpEF and LVH. She was seen in Feb in the ED for chest pain and HTN. Saw Dr.Chandrasekhar in the office in March. Several blood pressure medication changes have been made since. CT with FFR showed no evidence of significant functional stenosis.  Patient was seen today also by Dayton Scrape. I spoke with the patient and her daughter in their exam room. Blood pressure 132/76 in office. Patient reports blood pressure runs about 117 systolic at home. Has an upper arm cuff. Has not been exercising due to knee and back pain. Takes all her BP meds in the AM.  Latest labs at PCP 09/20/20: Scr 0.98, K 3.9  Current HTN meds: valsartan 160mg  daily, spironolactone 50mg  daily, furosemide 20mg  daily, atenolol 100mg  daily Previously tried: amlodipine (LLE) BP goal: <130/80  Family History:  Family History  Problem Relation Age of Onset  . Heart disease Sister   . Heart disease Brother     Social History: never smoked, no alcohol  Diet: not addressed today  Exercise: none due to knee and back pain  Home BP readings:  Around 117 systolic per pt report  Wt Readings from Last 3 Encounters:  08/10/20 211 lb 9.6 oz (96 kg)  07/31/20 214 lb (97.1 kg)  07/16/20 216 lb (98 kg)   BP Readings from Last 3 Encounters:  08/20/20 105/65  08/10/20 (!) 152/90  08/01/20 108/68   Pulse Readings from Last 3 Encounters:  08/20/20 63  08/10/20 78  08/01/20 64    Renal function: CrCl cannot be calculated (Patient's most recent lab result is older than the maximum 21 days allowed.).  Past Medical History:  Diagnosis Date  . Asthma   . Cardiomegaly   . CHF (congestive heart failure) (HCC)   . Cough   . Diabetes (HCC)   . Dyspnea   . History of swelling of  feet   . Hypertension   . SOB (shortness of breath)   . Swelling of ankle     Current Outpatient Medications on File Prior to Visit  Medication Sig Dispense Refill  . aspirin EC 81 MG tablet Take 81 mg by mouth daily. Swallow whole.    08/03/20 atenolol (TENORMIN) 100 MG tablet Take 1 tablet (100 mg total) by mouth daily. 90 tablet 3  . calcium-vitamin D (OSCAL WITH D) 500-200 MG-UNIT tablet Take 1 tablet by mouth.    . furosemide (LASIX) 20 MG tablet Take 1 tablet (20 mg total) by mouth daily. 90 tablet 3  . glimepiride (AMARYL) 2 MG tablet Take 2 mg by mouth 2 (two) times daily.    08/22/20 guaiFENesin (MUCINEX) 600 MG 12 hr tablet Take by mouth 2 (two) times daily as needed for cough or to loosen phlegm.    . mometasone-formoterol (DULERA) 100-5 MCG/ACT AERO Inhale 2 puffs into the lungs 2 (two) times daily.    . montelukast (SINGULAIR) 10 MG tablet Take 10 mg by mouth daily.    . pantoprazole (PROTONIX) 40 MG tablet Take 40 mg by mouth daily.    . rosuvastatin (CRESTOR)  10 MG tablet Take 10 mg by mouth daily.    Marland Kitchen spironolactone (ALDACTONE) 25 MG tablet Take 1 tablet (25 mg total) by mouth daily. 90 tablet 3  . valsartan (DIOVAN) 160 MG tablet Take 1 tablet (160 mg total) by mouth daily. 30 tablet 2   No current facility-administered medications on file prior to visit.    Allergies  Allergen Reactions  . Glipizide Other (See Comments) and Rash  . Hydroxychloroquine Other (See Comments) and Rash  . Ibuprofen Shortness Of Breath and Other (See Comments)  . Metformin Other (See Comments)  . Amlodipine Swelling  . Metformin And Related   . Saline Nasal Spray   . Zyrtec [Cetirizine]     There were no vitals taken for this visit.   Assessment/Plan:  1. Hypertension - Blood pressure is close to goal of <130/80 in clinic today. Reports being at goal at home. Continue valsartan 160mg  daily, spironolactone 50mg  daily, furosemide 20mg  daily and atenolol 100mg  daily. Labs are stable after  increased spironolactone dose. Follow up in HTN clinic on 6/1. Intended for 3 week follow up, but due to scheduling conflict with daughter who is her transportation, had to do 5 weeks. I have asked the patient to bring her blood pressure cuff and log with her so we can compare to our clinic cuff. Blood pressure goal reviewed. She is to call if blood pressure is high.  Thank you  , Pharm.D, BCPS, CPP [facility]  [address], [place], [name]  Phone: [phone]; Fax: [phone]

## 2020-10-03 NOTE — Patient Instructions (Signed)
Medication Instructions:  Your physician recommends that you continue on your current medications as directed. Please refer to the Current Medication list given to you today.  *If you need a refill on your cardiac medications before your next appointment, please call your pharmacy*   Lab Work: None ordered   If you have labs (blood work) drawn today and your tests are completely normal, you will receive your results only by: Marland Kitchen MyChart Message (if you have MyChart) OR . A paper copy in the mail If you have any lab test that is abnormal or we need to change your treatment, we will call you to review the results.   Testing/Procedures: None ordered   Follow-Up: At [facility], you and your health needs are our priority.  As part of our continuing mission to provide you with exceptional heart care, we have created designated Provider Care Teams.  These Care Teams include your primary Cardiologist (physician) and Advanced Practice Providers (APPs -  Physician Assistants and Nurse Practitioners) who all work together to provide you with the care you need, when you need it.  We recommend signing up for the patient portal called "MyChart".  Sign up information is provided on this After Visit Summary.  MyChart is used to connect with patients for Virtual Visits (Telemedicine).  Patients are able to view lab/test results, encounter notes, upcoming appointments, etc.  Non-urgent messages can be sent to your provider as well.   To learn more about what you can do with MyChart, go to [email or web address].    Your next appointment:   6 month(s)  The format for your next appointment:   In Person  Provider:   You may see Christell Constant, MD or one of the following Advanced Practice Providers on your designated Care Team:    Ronie Spies, PA-C  Jacolyn Reedy, PA-C    Other Instructions Two Gram Sodium Diet 2000 mg  What is Sodium? Sodium is a mineral found naturally in many foods.  The most significant source of sodium in the diet is table salt, which is about 40% sodium.  Processed, convenience, and preserved foods also contain a large amount of sodium.  The body needs only 500 mg of sodium daily to function,  A normal diet provides more than enough sodium even if you do not use salt.  Why Limit Sodium? A build up of sodium in the body can cause thirst, increased blood pressure, shortness of breath, and water retention.  Decreasing sodium in the diet can reduce edema and risk of heart attack or stroke associated with high blood pressure.  Keep in mind that there are many other factors involved in these health problems.  Heredity, obesity, lack of exercise, cigarette smoking, stress and what you eat all play a role.  General Guidelines:  Do not add salt at the table or in cooking.  One teaspoon of salt contains over 2 grams of sodium.  Read food labels  Avoid processed and convenience foods  Ask your dietitian before eating any foods not dicussed in the menu planning guidelines  Consult your physician if you wish to use a salt substitute or a sodium containing medication such as antacids.  Limit milk and milk products to 16 oz (2 cups) per day.  Shopping Hints:  READ LABELS!! "Dietetic" does not necessarily mean low sodium.  Salt and other sodium ingredients are often added to foods during processing.   Menu Planning Guidelines Food Group Choose More Often Avoid  Beverages (see  also the milk group All fruit juices, low-sodium, salt-free vegetables juices, low-sodium carbonated beverages Regular vegetable or tomato juices, commercially softened water used for drinking or cooking  Breads and Cereals Enriched white, wheat, rye and pumpernickel bread, hard rolls and dinner rolls; muffins, cornbread and waffles; most dry cereals, cooked cereal without added salt; unsalted crackers and breadsticks; low sodium or homemade bread crumbs Bread, rolls and crackers with salted  tops; quick breads; instant hot cereals; pancakes; commercial bread stuffing; self-rising flower and biscuit mixes; regular bread crumbs or cracker crumbs  Desserts and Sweets Desserts and sweets mad with mild should be within allowance Instant pudding mixes and cake mixes  Fats Butter or margarine; vegetable oils; unsalted salad dressings, regular salad dressings limited to 1 Tbs; light, sour and heavy cream Regular salad dressings containing bacon fat, bacon bits, and salt pork; snack dips made with instant soup mixes or processed cheese; salted nuts  Fruits Most fresh, frozen and canned fruits Fruits processed with salt or sodium-containing ingredient (some dried fruits are processed with sodium sulfites        Vegetables Fresh, frozen vegetables and low- sodium canned vegetables Regular canned vegetables, sauerkraut, pickled vegetables, and others prepared in brine; frozen vegetables in sauces; vegetables seasoned with ham, bacon or salt pork  Condiments, Sauces, Miscellaneous  Salt substitute with physician's approval; pepper, herbs, spices; vinegar, lemon or lime juice; hot pepper sauce; garlic powder, onion powder, low sodium soy sauce (1 Tbs.); low sodium condiments (ketchup, chili sauce, mustard) in limited amounts (1 tsp.) fresh ground horseradish; unsalted tortilla chips, pretzels, potato chips, popcorn, salsa (1/4 cup) Any seasoning made with salt including garlic salt, celery salt, onion salt, and seasoned salt; sea salt, rock salt, kosher salt; meat tenderizers; monosodium glutamate; mustard, regular soy sauce, barbecue, sauce, chili sauce, teriyaki sauce, steak sauce, Worcestershire sauce, and most flavored vinegars; canned gravy and mixes; regular condiments; salted snack foods, olives, picles, relish, horseradish sauce, catsup   Food preparation: Try these seasonings Meats:    Pork Sage, onion Serve with applesauce  Chicken Poultry seasoning, thyme, parsley Serve with cranberry  sauce  Lamb Curry powder, rosemary, garlic, thyme Serve with mint sauce or jelly  Veal Marjoram, basil Serve with current jelly, cranberry sauce  Beef Pepper, bay leaf Serve with dry mustard, unsalted chive butter  Fish Bay leaf, dill Serve with unsalted lemon butter, unsalted parsley butter  Vegetables:    Asparagus Lemon juice   Broccoli Lemon juice   Carrots Mustard dressing parsley, mint, nutmeg, glazed with unsalted butter and sugar   Green beans Marjoram, lemon juice, nutmeg,dill seed   Tomatoes Basil, marjoram, onion   Spice /blend for [employer]" 4 tsp ground thyme 1 tsp ground sage 3 tsp ground rosemary 4 tsp ground marjoram   Test your knowledge 1. A product that says "Salt Free" may still contain sodium. True or False 2. Garlic Powder and Hot Pepper Sauce an be used as alternative seasonings.True or False 3. Processed foods have more sodium than fresh foods.  True or False 4. Canned Vegetables have less sodium than froze True or False  WAYS TO DECREASE YOUR SODIUM INTAKE 1. Avoid the use of added salt in cooking and at the table.  Table salt (and other prepared seasonings which contain salt) is probably one of the greatest sources of sodium in the diet.  Unsalted foods can gain flavor from the sweet, sour, and butter taste sensations of herbs and spices.  Instead of using salt for seasoning,  try the following seasonings with the foods listed.  Remember: how you use them to enhance natural food flavors is limited only by your creativity... Allspice-Meat, fish, eggs, fruit, peas, red and yellow vegetables Almond Extract-Fruit baked goods Anise Seed-Sweet breads, fruit, carrots, beets, cottage cheese, cookies (tastes like licorice) Basil-Meat, fish, eggs, vegetables, rice, vegetables salads, soups, sauces Bay Leaf-Meat, fish, stews, poultry Burnet-Salad, vegetables (cucumber-like flavor) Caraway Seed-Bread, cookies, cottage cheese, meat, vegetables, cheese, rice Cardamon-Baked  goods, fruit, soups Celery Powder or seed-Salads, salad dressings, sauces, meatloaf, soup, bread.Do not use  celery salt Chervil-Meats, salads, fish, eggs, vegetables, cottage cheese (parsley-like flavor) Chili Power-Meatloaf, chicken cheese, corn, eggplant, egg dishes Chives-Salads cottage cheese, egg dishes, soups, vegetables, sauces Cilantro-Salsa, casseroles Cinnamon-Baked goods, fruit, pork, lamb, chicken, carrots Cloves-Fruit, baked goods, fish, pot roast, green beans, beets, carrots Coriander-Pastry, cookies, meat, salads, cheese (lemon-orange flavor) Cumin-Meatloaf, fish,cheese, eggs, cabbage,fruit pie (caraway flavor) [employer], fruit, eggs, fish, poultry, cottage cheese, vegetables Dill Seed-Meat, cottage cheese, poultry, vegetables, fish, salads, bread Fennel Seed-Bread, cookies, apples, pork, eggs, fish, beets, cabbage, cheese, Licorice-like flavor Garlic-(buds or powder) Salads, meat, poultry, fish, bread, butter, vegetables, potatoes.Do not  use garlic salt Ginger-Fruit, vegetables, baked goods, meat, fish, poultry Horseradish Root-Meet, vegetables, butter Lemon Juice or Extract-Vegetables, fruit, tea, baked goods, fish salads Mace-Baked goods fruit, vegetables, fish, poultry (taste like nutmeg) Maple Extract-Syrups Marjoram-Meat, chicken, fish, vegetables, breads, green salads (taste like Sage) Mint-Tea, lamb, sherbet, vegetables, desserts, carrots, cabbage Mustard, Dry or Seed-Cheese, eggs, meats, vegetables, poultry Nutmeg-Baked goods, fruit, chicken, eggs, vegetables, desserts Onion Powder-Meat, fish, poultry, vegetables, cheese, eggs, bread, rice salads (Do not use   Onion salt) Orange Extract-Desserts, baked goods Oregano-Pasta, eggs, cheese, onions, pork, lamb, fish, chicken, vegetables, green salads Paprika-Meat, fish, poultry, eggs, cheese, vegetables Parsley Flakes-Butter, vegetables, meat fish, poultry, eggs, bread, salads (certain forms may   Contain  sodium Pepper-Meat fish, poultry, vegetables, eggs Peppermint Extract-Desserts, baked goods Poppy Seed-Eggs, bread, cheese, fruit dressings, baked goods, noodles, vegetables, cottage  [employer], poultry, meat, fish, cauliflower, turnips,eggs bread Saffron-Rice, bread, veal, chicken, fish, eggs Sage-Meat, fish, poultry, onions, eggplant, tomateos, pork, stews Savory-Eggs, salads, poultry, meat, rice, vegetables, soups, pork Tarragon-Meat, poultry, fish, eggs, butter, vegetables (licorice-like flavor)  Thyme-Meat, poultry, fish, eggs, vegetables, (clover-like flavor), sauces, soups Tumeric-Salads, butter, eggs, fish, rice, vegetables (saffron-like flavor) Vanilla Extract-Baked goods, candy Vinegar-Salads, vegetables, meat marinades Walnut Extract-baked goods, candy  2. Choose your Foods Wisely   The following is a list of foods to avoid which are high in sodium:  Meats-Avoid all smoked, canned, salt cured, dried and kosher meat and fish as well as Anchovies   [employer] meats:Bologna, Liverwurst, Pastrami Canned meat or fish  Marinated herring Caviar    Pepperoni Corned Beef   Pizza Dried chipped beef  Salami Frozen breaded fish or meat Salt pork Frankfurters or hot dogs  Sardines Gefilte fish   Sausage Ham (boiled ham, Proscuitto Smoked butt    spiced ham)   Spam      TV Dinners Vegetables Canned vegetables (Regular) Relish Canned mushrooms  Sauerkraut Olives    Tomato juice Pickles  Bakery and Dessert Products Canned puddings  Cream pies Cheesecake   Decorated cakes Cookies  Beverages/Juices Tomato juice, regular  Gatorade   V-8 vegetable juice, regular  Breads and Cereals Biscuit mixes   Salted potato chips, corn chips, pretzels Bread stuffing mixes  Salted crackers and rolls Pancake and waffle mixes Self-rising flour  Seasonings Accent  Meat sauces Barbecue sauce  Meat tenderizer Catsup    Monosodium  glutamate (MSG) Celery salt   Onion salt Chili sauce   Prepared mustard Garlic salt   Salt, seasoned salt, sea salt Gravy mixes   Soy sauce Horseradish   Steak sauce Ketchup   Tartar sauce Lite salt    Teriyaki sauce Marinade mixes   Worcestershire sauce  Others Baking powder   Cocoa and cocoa mixes Baking soda   Commercial casserole mixes Candy-caramels, chocolate  Dehydrated soups    Bars, fudge,nougats  Instant rice and pasta mixes Canned broth or soup  Maraschino cherries Cheese, aged and processed cheese and cheese spreads  Learning Assessment Quiz  Indicated T (for True) or F (for False) for each of the following statements:  1. _____ Fresh fruits and vegetables and unprocessed grains are generally low in sodium 2. _____ Water may contain a considerable amount of sodium, depending on the source 3. _____ You can always tell if a food is high in sodium by tasting it 4. _____ Certain laxatives my be high in sodium and should be avoided unless prescribed   by a physician or pharmacist 5. _____ Salt substitutes may be used freely by anyone on a sodium restricted diet 6. _____ Sodium is present in table salt, food additives and as a natural component of   most foods 7. _____ Table salt is approximately 90% sodium 8. _____ Limiting sodium intake may help prevent excess fluid accumulation in the body 9. _____ On a sodium-restricted diet, seasonings such as bouillon soy sauce, and    cooking wine should be used in place of table salt 10. _____ On an ingredient list, a product which lists monosodium glutamate as the first   ingredient is an appropriate food to include on a low sodium diet  Circle the best answer(s) to the following statements (Hint: there may be more than one correct answer)  11. On a low-sodium diet, some acceptable snack items are:    A. Olives  F. Bean dip   K. Grapefruit juice    B. Salted Pretzels G. Commercial Popcorn   L. Canned peaches    C. Carrot  Sticks  H. Bouillon   M. Unsalted nuts   D. [place] fries  I. Peanut butter crackers N. Salami   E. Sweet pickles J. Tomato Juice   O. Pizza  12.  Seasonings that may be used freely on a reduced - sodium diet include   A. Lemon wedges F.Monosodium glutamate K. Celery seed    B.Soysauce   G. Pepper   L. Mustard powder   C. Sea salt  H. Cooking wine  M. Onion flakes   D. Vinegar  E. Prepared horseradish N. Salsa   E. Sage   J. Worcestershire sauce  O. Chutney

## 2020-10-11 ENCOUNTER — Other Ambulatory Visit: Payer: Medicare Other

## 2020-10-11 DIAGNOSIS — F419 Anxiety disorder, unspecified: Secondary | ICD-10-CM | POA: Diagnosis not present

## 2020-10-11 DIAGNOSIS — M5412 Radiculopathy, cervical region: Secondary | ICD-10-CM | POA: Diagnosis not present

## 2020-10-11 DIAGNOSIS — M5416 Radiculopathy, lumbar region: Secondary | ICD-10-CM | POA: Diagnosis not present

## 2020-10-18 ENCOUNTER — Other Ambulatory Visit: Payer: Self-pay | Admitting: Chiropractic Medicine

## 2020-10-18 DIAGNOSIS — M542 Cervicalgia: Secondary | ICD-10-CM

## 2020-10-22 ENCOUNTER — Emergency Department (HOSPITAL_COMMUNITY)
Admission: EM | Admit: 2020-10-22 | Discharge: 2020-10-22 | Disposition: A | Discharge status: Home / Self Care | Payer: Medicare Other | Attending: Emergency Medicine | Admitting: Emergency Medicine

## 2020-10-22 ENCOUNTER — Emergency Department (HOSPITAL_COMMUNITY): Payer: Medicare Other

## 2020-10-22 ENCOUNTER — Encounter (HOSPITAL_COMMUNITY): Payer: Self-pay | Admitting: *Deleted

## 2020-10-22 DIAGNOSIS — J45909 Unspecified asthma, uncomplicated: Secondary | ICD-10-CM | POA: Insufficient documentation

## 2020-10-22 DIAGNOSIS — Z79899 Other long term (current) drug therapy: Secondary | ICD-10-CM | POA: Diagnosis not present

## 2020-10-22 DIAGNOSIS — R0789 Other chest pain: Secondary | ICD-10-CM | POA: Insufficient documentation

## 2020-10-22 DIAGNOSIS — Z20822 Contact with and (suspected) exposure to covid-19: Secondary | ICD-10-CM | POA: Diagnosis not present

## 2020-10-22 DIAGNOSIS — J069 Acute upper respiratory infection, unspecified: Secondary | ICD-10-CM | POA: Insufficient documentation

## 2020-10-22 DIAGNOSIS — E119 Type 2 diabetes mellitus without complications: Secondary | ICD-10-CM | POA: Diagnosis not present

## 2020-10-22 DIAGNOSIS — R079 Chest pain, unspecified: Secondary | ICD-10-CM

## 2020-10-22 DIAGNOSIS — Z7984 Long term (current) use of oral hypoglycemic drugs: Secondary | ICD-10-CM | POA: Insufficient documentation

## 2020-10-22 DIAGNOSIS — I110 Hypertensive heart disease with heart failure: Secondary | ICD-10-CM | POA: Diagnosis not present

## 2020-10-22 DIAGNOSIS — Z87891 Personal history of nicotine dependence: Secondary | ICD-10-CM | POA: Diagnosis not present

## 2020-10-22 DIAGNOSIS — R059 Cough, unspecified: Secondary | ICD-10-CM | POA: Diagnosis present

## 2020-10-22 DIAGNOSIS — I509 Heart failure, unspecified: Secondary | ICD-10-CM | POA: Diagnosis not present

## 2020-10-22 DIAGNOSIS — Z7982 Long term (current) use of aspirin: Secondary | ICD-10-CM | POA: Diagnosis not present

## 2020-10-22 LAB — BASIC METABOLIC PANEL
Anion gap: 9 (ref 5–15)
BUN: 14 mg/dL (ref 8–23)
CO2: 22 mmol/L (ref 22–32)
Calcium: 10.1 mg/dL (ref 8.9–10.3)
Chloride: 105 mmol/L (ref 98–111)
Creatinine, Ser: 0.84 mg/dL (ref 0.44–1.00)
GFR, Estimated: >60 mL/min (ref >60)
Glucose, Bld: 153 mg/dL — ABNORMAL HIGH (ref 70–99)
Potassium: 4.3 mmol/L (ref 3.5–5.1)
Sodium: 136 mmol/L (ref 135–145)

## 2020-10-22 LAB — CBC
HCT: 41.7 % (ref 36.0–46.0)
Hemoglobin: 13.7 g/dL (ref 12.0–15.0)
MCH: 27.8 pg (ref 26.0–34.0)
MCHC: 32.9 g/dL (ref 30.0–36.0)
MCV: 84.6 fL (ref 80.0–100.0)
Platelets: 216 10*3/uL (ref 150–400)
RBC: 4.93 MIL/uL (ref 3.87–5.11)
RDW: 16.1 % — ABNORMAL HIGH (ref 11.5–15.5)
WBC: 8.3 10*3/uL (ref 4.0–10.5)
nRBC: 0 % (ref 0.0–0.2)

## 2020-10-22 LAB — TROPONIN I (HIGH SENSITIVITY)
Troponin I (High Sensitivity): <2 ng/L (ref <18)
Troponin I (High Sensitivity): <2 ng/L (ref <18)

## 2020-10-22 LAB — SARS CORONAVIRUS 2 (TAT 6-24 HRS): SARS Coronavirus 2: NEGATIVE

## 2020-10-22 IMAGING — CR DG CHEST 2V
2 series · 2 of 2 positions shown · non-contrast
Comparison: [DATE].

CLINICAL DATA: Chest pain.

EXAM:
CHEST - 2 VIEW

[x chest ap]
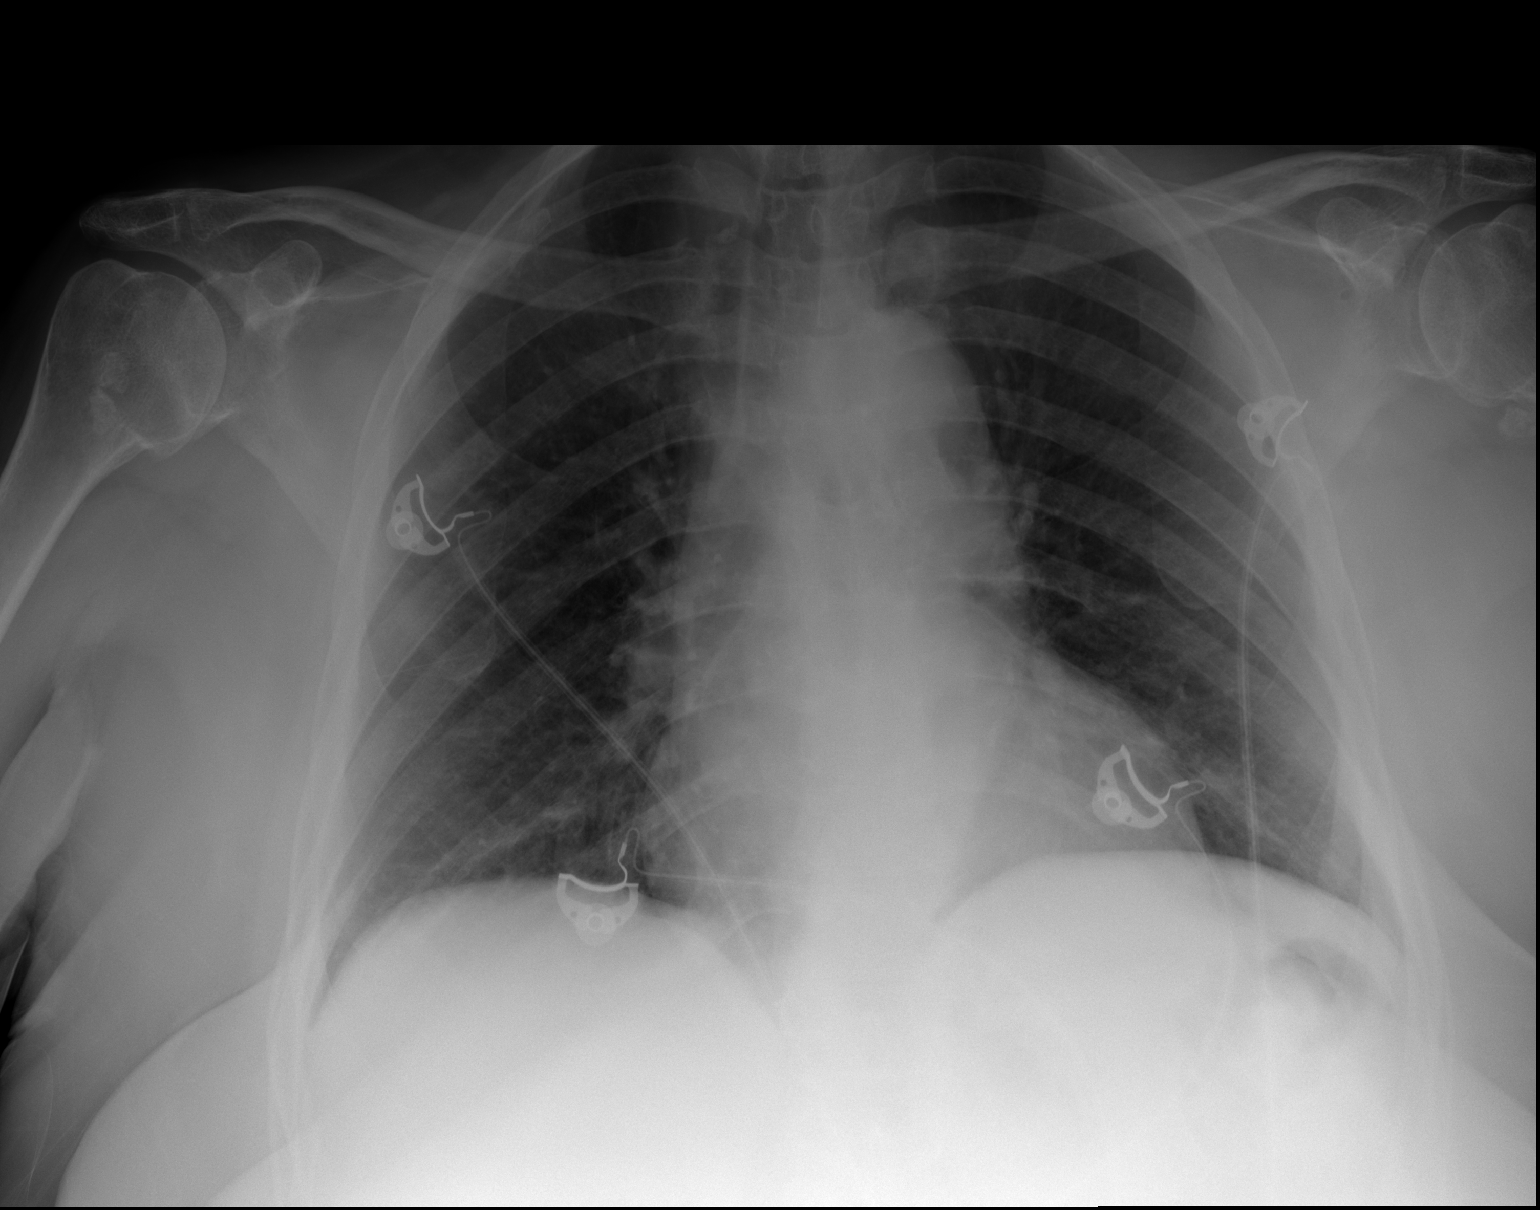

[w chest lat]
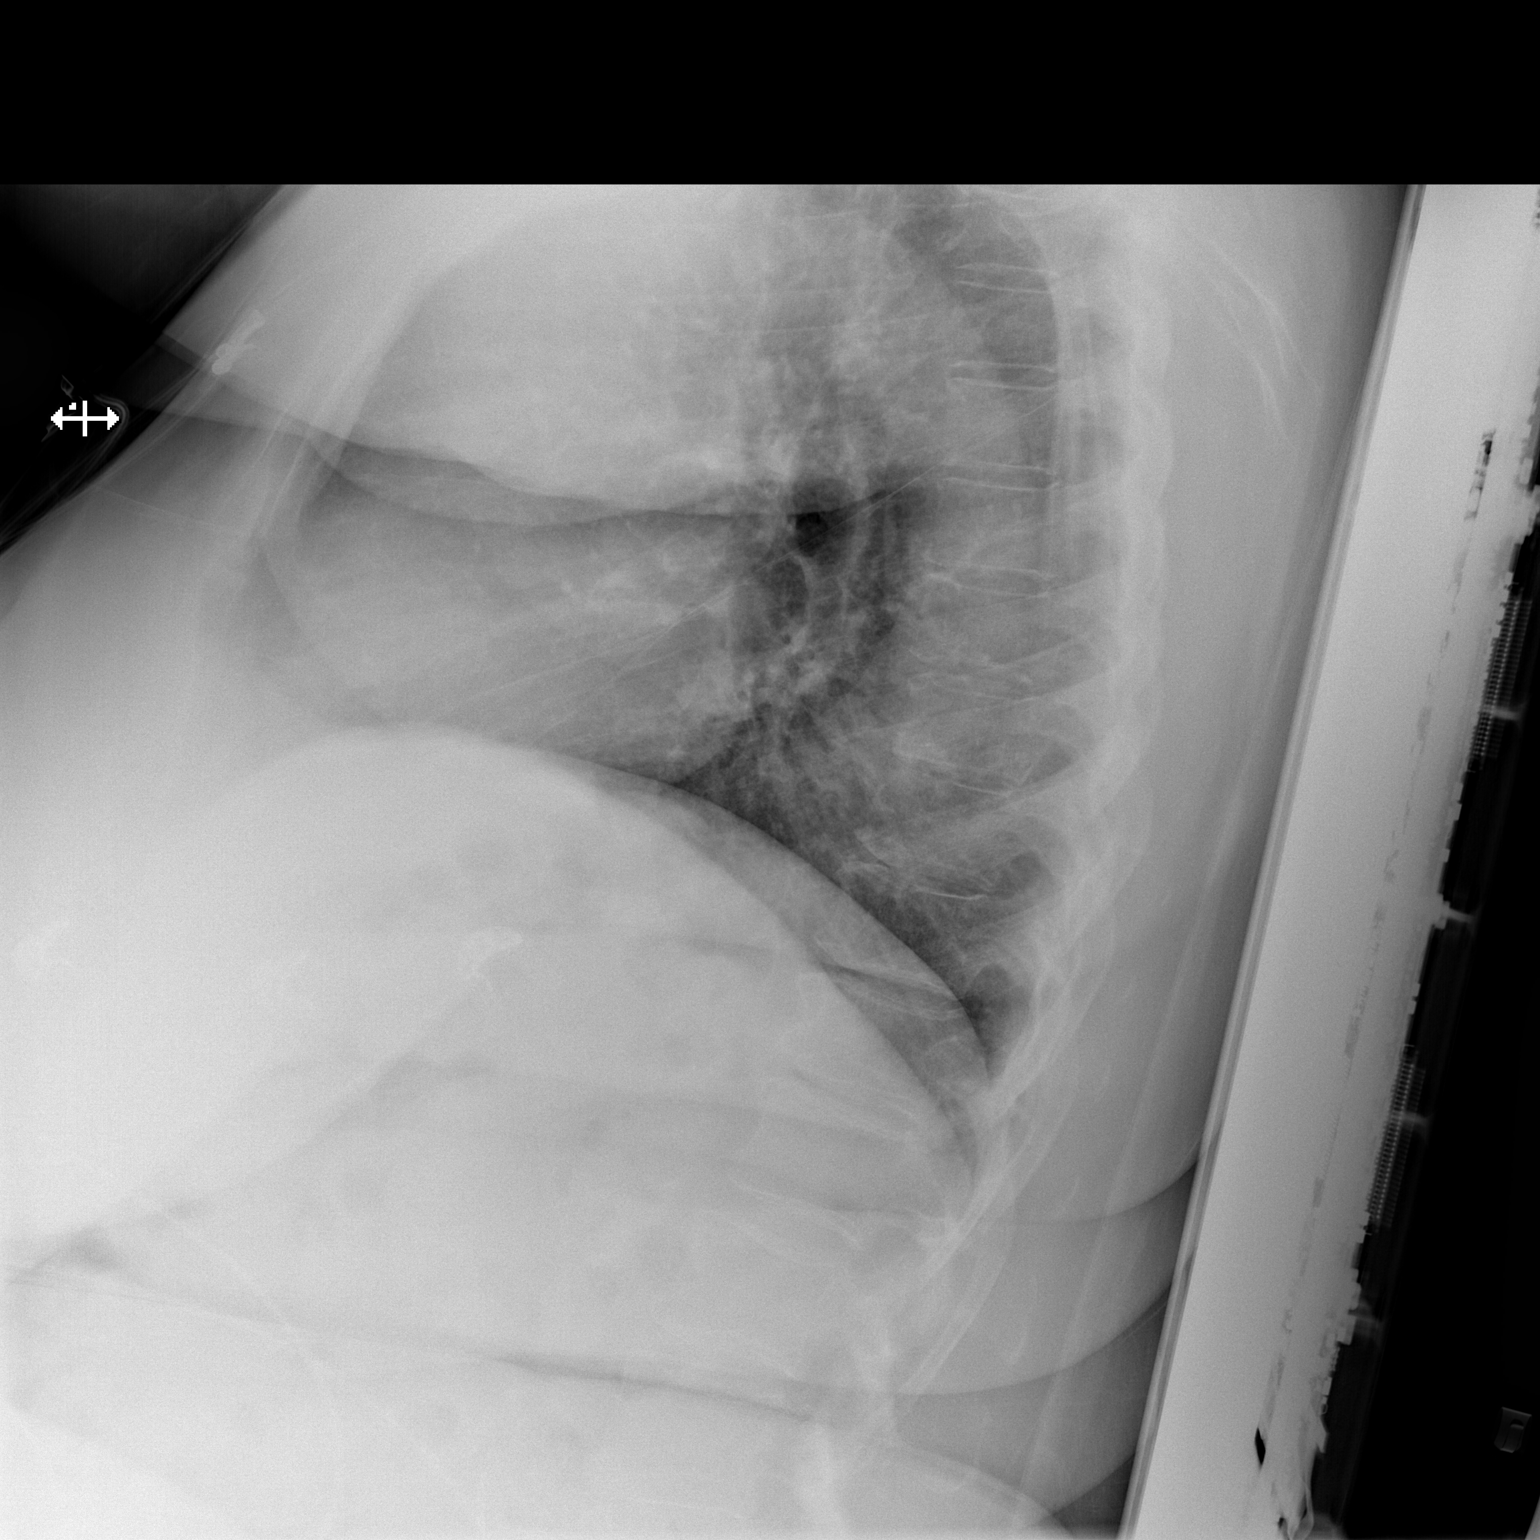

[2 of 2 positions shown; findings below may reference images not displayed]

FINDINGS: The heart size and mediastinal contours are within normal limits.
Both lungs are clear. The visualized skeletal structures are
unremarkable.
IMPRESSION: No active cardiopulmonary disease.

## 2020-10-22 MED ORDER — CYCLOBENZAPRINE HCL 10 MG PO TABS
10.0000 mg | ORAL_TABLET | Freq: Once | ORAL | Status: AC
  Administered 2020-10-22: 10 mg via ORAL
  Filled 2020-10-22: qty 1

## 2020-10-22 MED ORDER — DEXAMETHASONE 4 MG PO TABS
10.0000 mg | ORAL_TABLET | Freq: Once | ORAL | Status: AC
  Administered 2020-10-22: 10 mg via ORAL
  Filled 2020-10-22: qty 2

## 2020-10-22 MED ORDER — CYCLOBENZAPRINE HCL 10 MG PO TABS: 10.0000 mg | ORAL_TABLET | Freq: Two times a day (BID) | ORAL | 0 refills | Status: DC | PRN

## 2020-10-22 NOTE — ED Provider Notes (Signed)
[facility]-EMERGENCY DEPT Provider Note   CSN: [id number] Arrival date & time: 10/22/20  0801     History Chief Complaint  Patient presents with  . Chest Pain    Donna Garner is a 75 y.o. female.  HPI    CP and dyspnea coming and going since Saturday Cough as well, coughing up mucus Tried breathing treatments, rescue inhaler which helped some but it didn't go away After breathing treatment takes an hour or two and then coughs up mucus, sometimes yellow, usually clear, humidifier Pain under left breast, pain and dyspnea woke her from sleep last night Pain is a tightness, worse with coughing/deep breaths/changing positiosn and walking Had MRI on back and supposed to have one on neck, not sure if that is related as having pain, and thinks that is driving BP Coughing for 3 days, no sore throat, has had congestion No known sick contacts, covid vaccine and booster  No fever, abd pain, n/v, new leg pain or swelling Seldom lays on back due to dyspnea and back pain   Past Medical History:  Diagnosis Date  . Asthma   . Cardiomegaly   . CHF (congestive heart failure) (HCC)   . Cough   . Diabetes (HCC)   . Dyspnea   . History of swelling of feet   . Hypertension   . SOB (shortness of breath)   . Swelling of ankle     Patient Active Problem List   Diagnosis Date Noted  . HTN (hypertension) 10/03/2020  . Claudication (HCC) 07/16/2020  . Chronic heart failure with preserved ejection fraction (HCC) 05/08/2020  . Diabetes mellitus with coincident hypertension (HCC) 05/08/2020  . Chest pain of uncertain etiology 05/08/2020    History reviewed. No pertinent surgical history.   OB History   No obstetric history on file.     Family History  Problem Relation Age of Onset  . Heart disease Sister   . Heart disease Brother     Social History   Tobacco Use  . Smoking status: Former Games developer  . Smokeless tobacco: Never Used  Substance Use Topics  .  Alcohol use: Never  . Drug use: Never    Home Medications Prior to Admission medications   Medication Sig Start Date End Date Taking? Authorizing Provider  cyclobenzaprine (FLEXERIL) 10 MG tablet Take 1 tablet (10 mg total) by mouth 2 (two) times daily as needed for muscle spasms. 10/22/20  Yes Alvira Monday, MD  aspirin EC 81 MG tablet Take 81 mg by mouth daily. Swallow whole.    [provider]  atenolol (TENORMIN) 100 MG tablet Take 1 tablet (100 mg total) by mouth daily. 08/28/20   Christell Constant, MD  calcium-vitamin D (OSCAL WITH D) 500-200 MG-UNIT tablet Take 1 tablet by mouth.    [provider]  furosemide (LASIX) 20 MG tablet Take 1 tablet (20 mg total) by mouth daily. 05/08/20   Chandrasekhar, Mahesh A, MD  glimepiride (AMARYL) 2 MG tablet Take 2 mg by mouth 2 (two) times daily. 08/01/20   [provider]  guaiFENesin (MUCINEX) 600 MG 12 hr tablet Take by mouth 2 (two) times daily as needed for cough or to loosen phlegm.    [provider]  mometasone-formoterol (DULERA) 100-5 MCG/ACT AERO Inhale 2 puffs into the lungs 2 (two) times daily.    [provider]  montelukast (SINGULAIR) 10 MG tablet Take 10 mg by mouth daily. 04/12/20   [provider]  pantoprazole (  PROTONIX) 40 MG tablet Take 40 mg by mouth daily.    [provider]  rosuvastatin (CRESTOR) 10 MG tablet Take 10 mg by mouth daily.    [provider]  spironolactone (ALDACTONE) 50 MG tablet Take 1 tablet (50 mg total) by mouth daily. 10/03/20   Dyann Kief, PA-C  valsartan (DIOVAN) 160 MG tablet Take 1 tablet (160 mg total) by mouth daily. 08/10/20   Christell Constant, MD    Allergies    Glipizide, Hydroxychloroquine, Ibuprofen, Metformin, Amlodipine, Metformin and related, Saline nasal spray, and Zyrtec [cetirizine]  Review of Systems   Review of Systems  Constitutional: Positive for fatigue. Negative for fever.  HENT: Positive  for congestion. Negative for sore throat.   Eyes: Negative for visual disturbance.  Respiratory: Positive for cough and shortness of breath.   Cardiovascular: Positive for chest pain. Negative for leg swelling.  Gastrointestinal: Negative for abdominal pain, nausea and vomiting.  Genitourinary: Negative for difficulty urinating.  Musculoskeletal: Positive for arthralgias, back pain (chronic) and neck pain.  Skin: Negative for rash.  Neurological: Negative for syncope and headaches.    Physical Exam Updated Vital Signs BP 114/84   Pulse (!) 56   Temp 98.2 F (36.8 C) (Oral)   Resp 16   SpO2 95%   Physical Exam Vitals and nursing note reviewed.  Constitutional:      General: She is not in acute distress.    Appearance: She is well-developed. She is not diaphoretic.  HENT:     Head: Normocephalic and atraumatic.  Eyes:     Conjunctiva/sclera: Conjunctivae normal.  Cardiovascular:     Rate and Rhythm: Normal rate and regular rhythm.     Heart sounds: Normal heart sounds. No murmur heard. No friction rub. No gallop.      Comments: Chest wall tenderness Pulmonary:     Effort: Pulmonary effort is normal. No respiratory distress.     Breath sounds: Normal breath sounds. No wheezing or rales.  Abdominal:     General: There is no distension.     Palpations: Abdomen is soft.     Tenderness: There is no abdominal tenderness. There is no guarding.  Musculoskeletal:        General: No tenderness.     Cervical back: Normal range of motion.  Skin:    General: Skin is warm and dry.     Findings: No erythema or rash.  Neurological:     Mental Status: She is alert and oriented to person, place, and time.     ED Results / Procedures / Treatments   Labs (all labs ordered are listed, but only abnormal results are displayed) Labs Reviewed  BASIC METABOLIC PANEL - Abnormal; Notable for the following components:      Result Value   Glucose, Bld 153 (*)    All other components within  normal limits  CBC - Abnormal; Notable for the following components:   RDW 16.1 (*)    All other components within normal limits  SARS CORONAVIRUS 2 (TAT 6-24 HRS)  TROPONIN I (HIGH SENSITIVITY)  TROPONIN I (HIGH SENSITIVITY)    EKG EKG Interpretation  Date/Time:  Monday Oct 22 2020 08:10:44 EDT Ventricular Rate:  65 PR Interval:  55 QRS Duration: 96 QT Interval:  408 QTC Calculation: 425 R Axis:   -37 Text Interpretation: Sinus rhythm Short PR interval Left axis deviation Abnormal R-wave progression, early transition Borderline T wave abnormalities No significant change since last tracing Confirmed by Dalene Seltzer,  Denny Peon ([id number]) on 10/22/2020 8:39:37 AM   Radiology DG Chest 2 View  Result Date: 10/22/2020 CLINICAL DATA:  Chest pain. EXAM: CHEST - 2 VIEW COMPARISON:  July 31, 2020. FINDINGS: The heart size and mediastinal contours are within normal limits. Both lungs are clear. The visualized skeletal structures are unremarkable. IMPRESSION: No active cardiopulmonary disease. Electronically Signed   By: Lupita Raider M.D.   On: 10/22/2020 08:56    Procedures Procedures   Medications Ordered in ED Medications  cyclobenzaprine (FLEXERIL) tablet 10 mg (10 mg Oral Given 10/22/20 0938)  dexamethasone (DECADRON) tablet 10 mg (10 mg Oral Given 10/22/20 1223)    ED Course  I have reviewed the triage vital signs and the nursing notes.  Pertinent labs & imaging results that were available during my care of the patient were reviewed by me and considered in my medical decision making (see chart for details).    MDM Rules/Calculators/A&P                          75 year old female with a history of CHF, diabetes, hypertension, asthma, presents with concern for cough, congestion, chest pain and shortness of breath.  EKG shows no significant changes in comparison to prior, no sign of STEMI or pericarditis.  Chest x-ray shows no evidence of pulmonary edema, pneumonia, pneumothorax.   Normal oxygen saturation, no tachycardia, no asymmetric leg swelling, or other PE risk factors, and have low suspicion at this time for pulmonary embolus.  Do not feel history and exam are consistent with aortic dissection.  She does have risk factors for coronary artery disease, however has accompanying symptoms at this time which suggest more likely viral upper respiratory infection.  Describes cough, congestion, and has significant chest wall tenderness at this time and in setting of negative troponins, history and exam feel symptoms are more likely related to muscular pain and viral bronchitis/URI than high risk angina.  She describes wheezing at home that is responding to breathing treatments temporarily but does not have significant wheezing at this time--do feel it is reasonable given symptoms of asthma treatment to give decadron in ED (discussed risks of hyperglycemia.) Recommend supportive care, given muscle relaxant and COVID 19 test completed and pending.   Final Clinical Impression(s) / ED Diagnoses Final diagnoses:  Nonspecific chest pain  URI with cough and congestion  Chest wall tenderness    Rx / DC Orders ED Discharge Orders         Ordered    cyclobenzaprine (FLEXERIL) 10 MG tablet  2 times daily PRN        10/22/20 1159           Alvira Monday, MD 10/22/20 1720

## 2020-10-22 NOTE — ED Triage Notes (Signed)
Pt complains of left chest pain, central chest tightness, intermittent x 2 days. Also has SOB. Hx of asthma, she tried breathing treatment yesterday w/ some relief.

## 2020-10-25 ENCOUNTER — Ambulatory Visit: Payer: Medicare Other | Admitting: Internal Medicine

## 2020-11-01 ENCOUNTER — Other Ambulatory Visit: Payer: Medicare Other

## 2020-11-03 ENCOUNTER — Ambulatory Visit
Admission: RE | Admit: 2020-11-03 | Discharge: 2020-11-03 | Disposition: A | Discharge status: Home / Self Care | Payer: Medicare Other | Source: Ambulatory Visit | Attending: Chiropractic Medicine | Admitting: Chiropractic Medicine

## 2020-11-03 ENCOUNTER — Other Ambulatory Visit: Payer: Self-pay

## 2020-11-03 DIAGNOSIS — M542 Cervicalgia: Secondary | ICD-10-CM

## 2020-11-03 DIAGNOSIS — M4802 Spinal stenosis, cervical region: Secondary | ICD-10-CM | POA: Diagnosis not present

## 2020-11-03 IMAGING — MR MR CERVICAL SPINE W/O CM
4 of 5 series · 29 of 48 positions shown · non-contrast
Comparison: None.

CLINICAL DATA: Severe neck and bilateral shoulder pain. Arm pain.
Symptoms for 2 years

EXAM:
MRI CERVICAL SPINE WITHOUT CONTRAST
TECHNIQUE: Multiplanar, multisequence MR imaging of the cervical spine was
performed. No intravenous contrast was administered.

[Series 2: T2 · sagittal · 3.0mm · 0.66mm/px · 7 of 15 slices shown (1 of 2)]
[im 1/15]
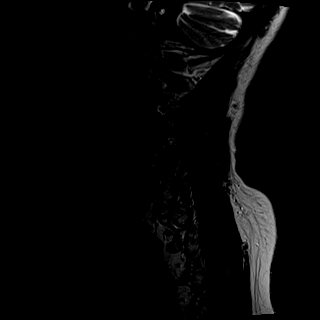
[im 3/15]
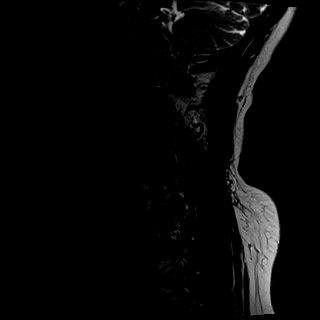
[im 5/15]
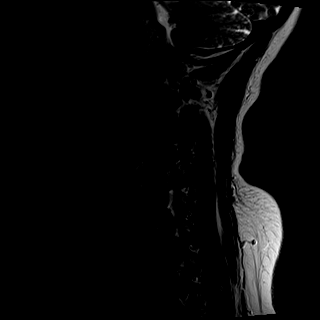
[im 8/15]
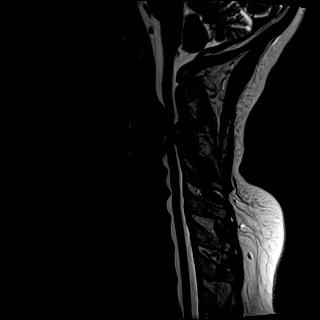
[im 10/15]
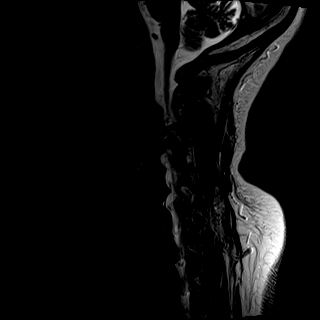
[im 12/15]
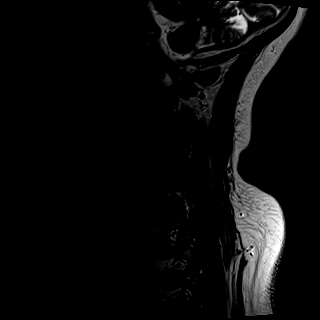
[im 15/15]
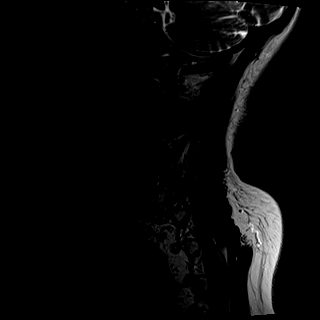

[Series 3: T1 · sagittal · 3.0mm · 0.41mm/px · 7 of 15 slices shown]
[im 1/15]
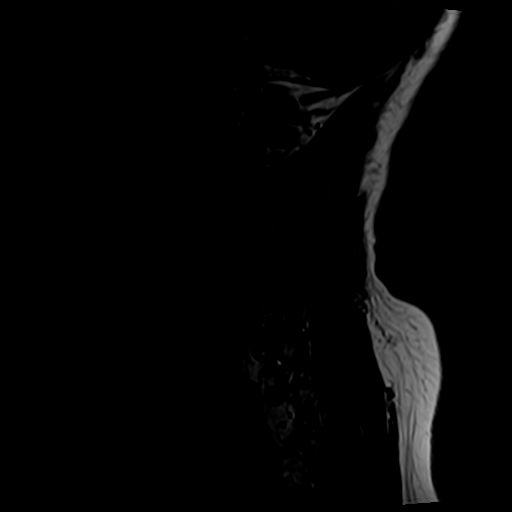
[im 3/15]
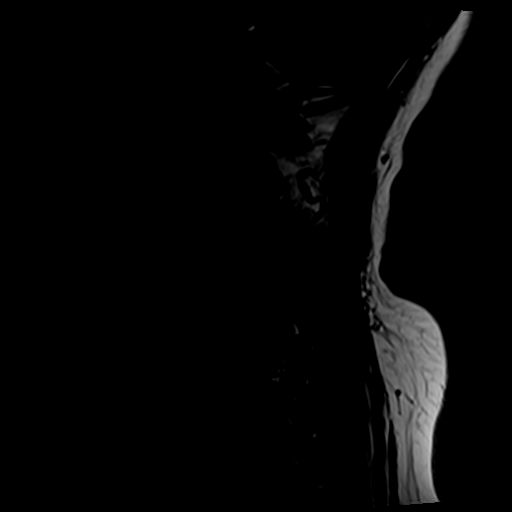
[im 5/15]
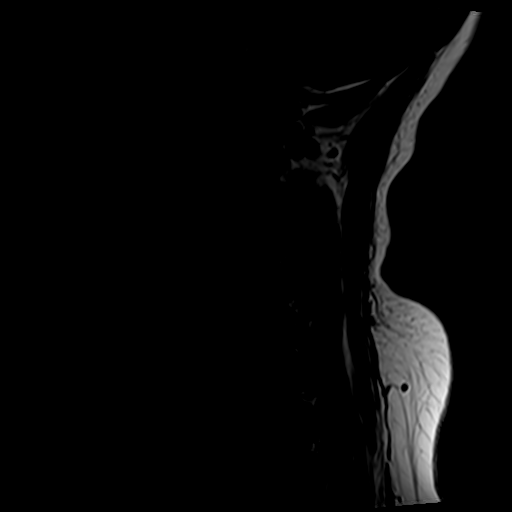
[im 8/15]
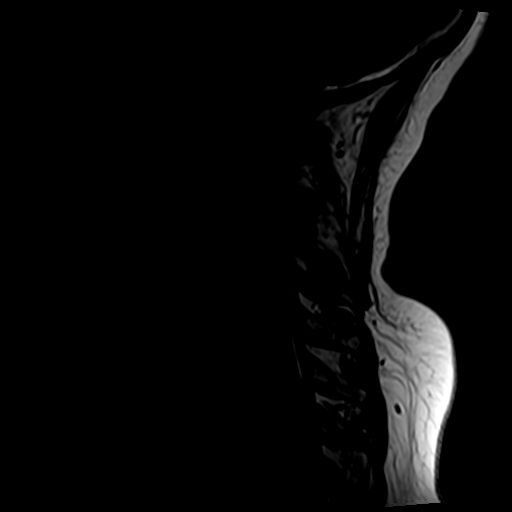
[im 10/15]
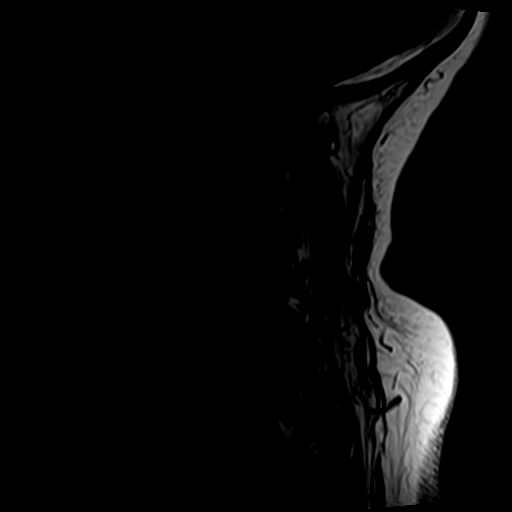
[im 12/15]
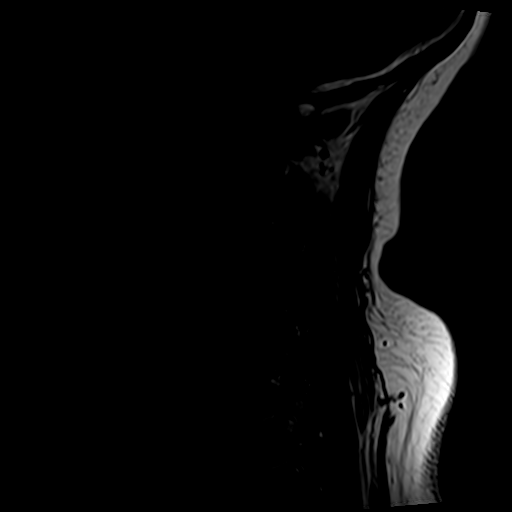
[im 15/15]
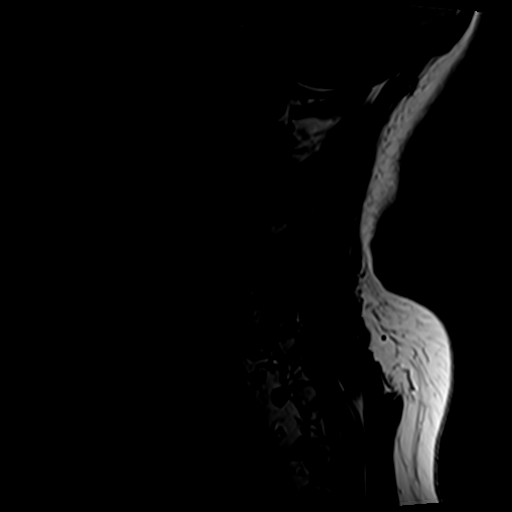

[Series 4: tir sag · sagittal · 3.0mm · 0.41mm/px · 6 of 15 slices shown]
[im 1/15]
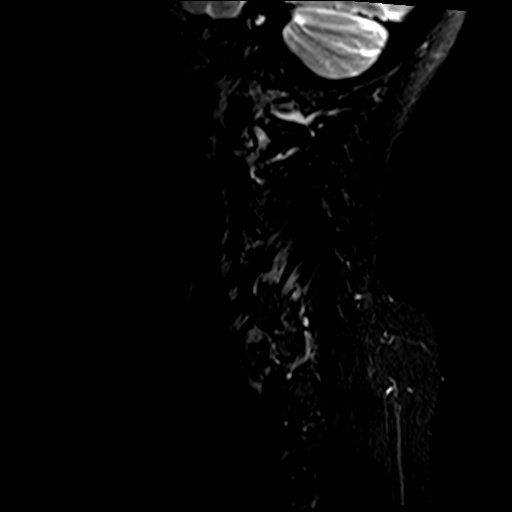
[im 3/15]
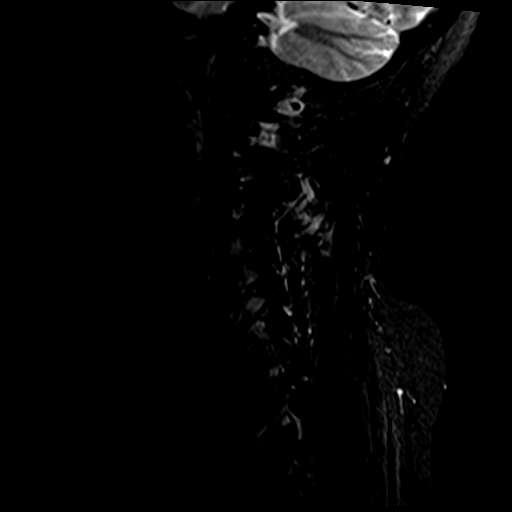
[im 5/15]
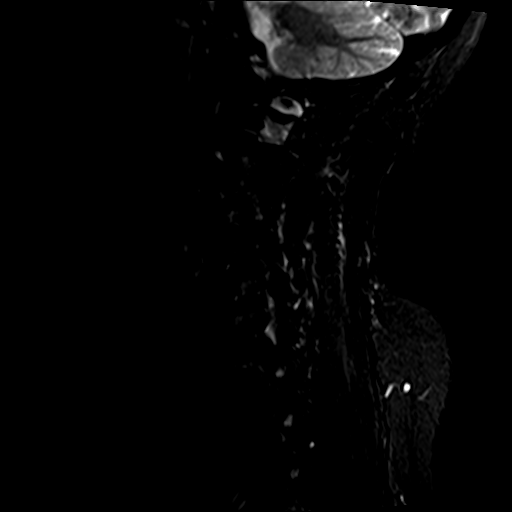
[im 7/15]
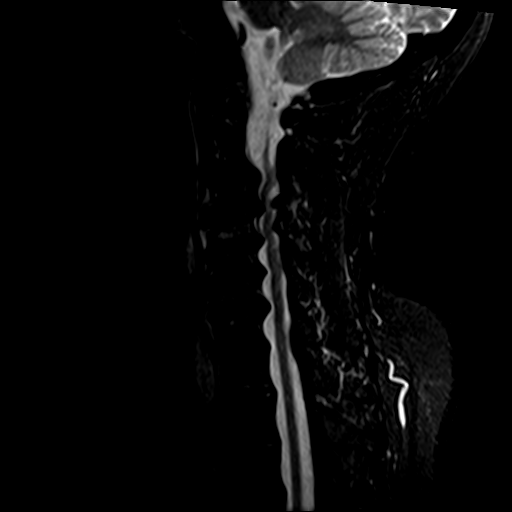
[im 9/15]
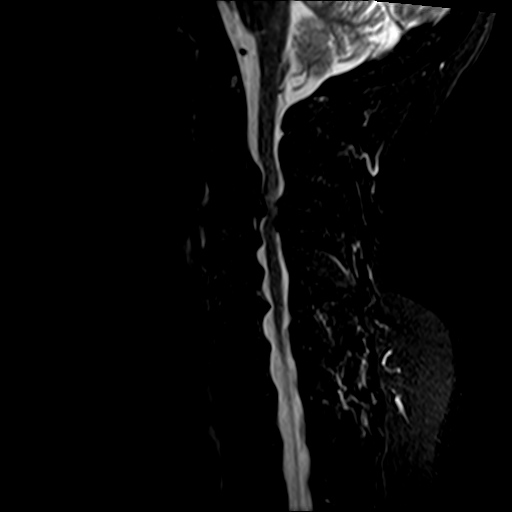
[im 13/15]
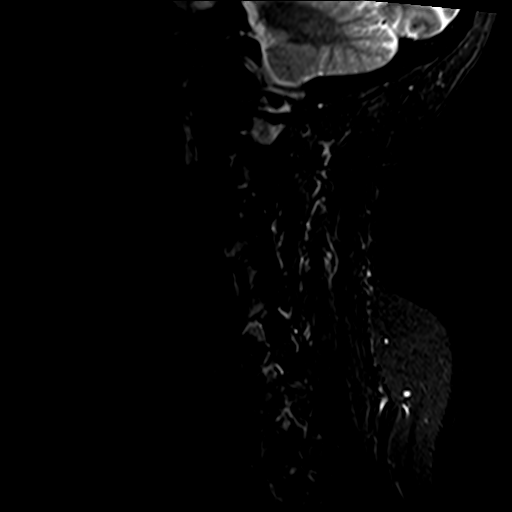

[Series 6: T2 · axial · 3.0mm · 0.70mm/px · z∈[-38,+50]mm · 9 of 25 slices shown (2 of 2)]
[im 1/25]
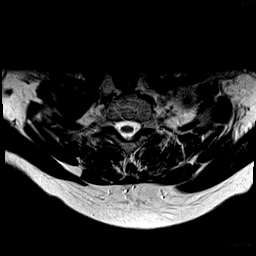
[im 5/25]
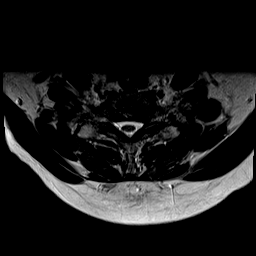
[im 9/25]
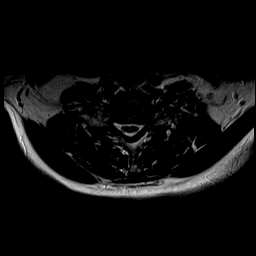
[im 11/25]
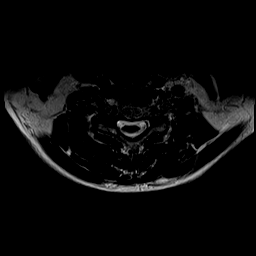
[im 13/25]
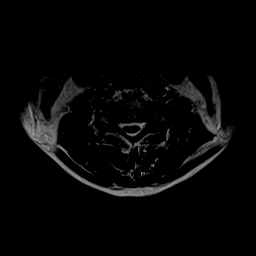
[im 15/25]
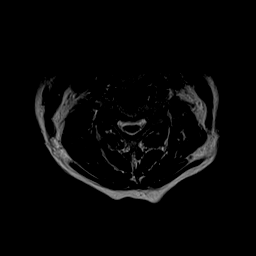
[im 17/25]
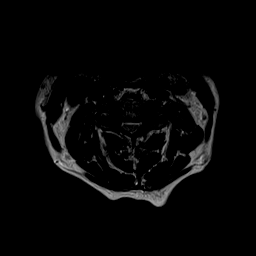
[im 21/25]
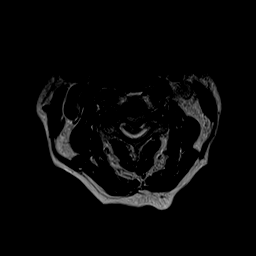
[im 25/25]
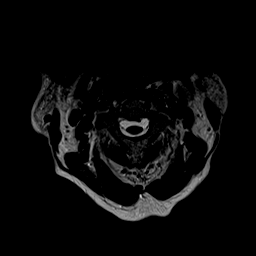

[29 of 48 positions shown; findings below may reference images not displayed]

FINDINGS: Alignment: Degenerative straightening of the cervical spine with
C2-3 anterolisthesis measuring 3-4 mm.

Vertebrae: No fracture, evidence of discitis, or bone lesion.

Cord: Degenerative deformity at C3-4.  No cord edema.

Posterior Fossa, vertebral arteries, paraspinal tissues: Negative

Disc levels:

C2-3: Facet spurring and ankylosis with fused anterolisthesis. Broad
central protrusion. Mild right foraminal narrowing.

C3-4: Disc collapse with left eccentric ridging and disc bulging.
Degenerative facet spurring and ligamentum flavum thickening.
Advanced spinal stenosis with cord flattening. Severe left foraminal
impingement. Right foraminal narrowing is at least moderate

C4-5: Right disc collapse with bulging and bulky
endplate/uncovertebral ridging. Mild facet spurring. Severe
biforaminal impingement. Mild spinal stenosis

C5-6: Disc narrowing and bulging with uncovertebral ridging. Mild
facet spurring. Advanced right and moderate (by gradient) left
foraminal narrowing.

C6-7: Disc narrowing and bulging with endplate and uncovertebral
ridging. Asymmetric left foraminal impingement. Mild spinal stenosis

C7-T1:Mild facet spurring and anterolisthesis.  Mild disc bulging.
IMPRESSION: 1. Advanced disc degeneration at C3-4 to C6-7 most notably causing
spinal stenosis with cord compression at C3-4.
2. C2-3 advanced facet osteoarthritis with facet ankylosis and fused
anterolisthesis.
3. Bilateral foraminal impingement at C3-4 to C6-7, severe at
multiple levels.

## 2020-11-06 ENCOUNTER — Other Ambulatory Visit: Payer: Self-pay

## 2020-11-06 DIAGNOSIS — I10 Essential (primary) hypertension: Secondary | ICD-10-CM

## 2020-11-06 MED ORDER — VALSARTAN 160 MG PO TABS: 160.0000 mg | ORAL_TABLET | Freq: Every day | ORAL | 3 refills | Status: DC

## 2020-11-06 NOTE — Telephone Encounter (Signed)
Pt's medication was sent to pt's pharmacy as requested. Confirmation received.  °

## 2020-11-07 ENCOUNTER — Other Ambulatory Visit: Payer: Self-pay

## 2020-11-07 ENCOUNTER — Ambulatory Visit (INDEPENDENT_AMBULATORY_CARE_PROVIDER_SITE_OTHER): Payer: Medicare Other | Admitting: Pharmacist

## 2020-11-07 VITALS — BP 126/78 | HR 71

## 2020-11-07 DIAGNOSIS — F411 Generalized anxiety disorder: Secondary | ICD-10-CM | POA: Diagnosis not present

## 2020-11-07 DIAGNOSIS — I10 Essential (primary) hypertension: Secondary | ICD-10-CM

## 2020-11-07 DIAGNOSIS — J4540 Moderate persistent asthma, uncomplicated: Secondary | ICD-10-CM | POA: Diagnosis not present

## 2020-11-07 DIAGNOSIS — Z9109 Other allergy status, other than to drugs and biological substances: Secondary | ICD-10-CM | POA: Diagnosis not present

## 2020-11-07 NOTE — Progress Notes (Signed)
Patient ID: Donna Garner                 DOB: October 02, 1945                      MRN: [id number]     HPI: Donna Garner is a 75 y.o. female referred by Dr. Izora Ribas to HTN clinic. PMH is significant for diabetes with HTN, Asthma, with history of HFpEF and LVH. She was seen in Feb in the ED for chest pain and HTN. Saw Dr.Chandrasekhar in the office in March. Several blood pressure medication changes have been made since. CT with FFR showed no evidence of significant functional stenosis.  She was last seen on 4/27 by Dayton Scrape and myself. No medication changes were made as blood pressure was 132/76 in clinic and patient reported home readings at goal. Seen in the ED for cough, chest pain and SOB thought to be from URI. BP in ED was 114/84.   Patient presents today, accompanied by her daughter. She states she is feeling a little better after her URI. She is still having a lot of pain with her back and neck. Waiting for Dr. To read MRI and decide what they can do. She states her BP is only elevated when she is in a lot of pain. She denies dizziness, lightheadedness. Gets sinus headaches. Denies blurred vision or SOB. She has just a little swelling. She took her medications this AM. Brought in 2 blood pressure cuffs that she has at home. Primarily using just one of them which was found to be accurate. Home BP cuff 123/74, clinic cuff 126/78.  Latest labs at PCP 10/22/20: Scr 0.84, K 4.3  Current HTN meds: valsartan 160mg  daily, spironolactone 50mg  daily, furosemide 20mg  daily, atenolol 100mg  daily Previously tried: amlodipine (LLE) BP goal: <130/80  Family History:  Family History  Problem Relation Age of Onset  . Heart disease Sister   . Heart disease Brother     Social History: never smoked, no alcohol  Diet: no salt canned vegetables and washes them  Exercise: none due to knee and back pain  Home BP readings:  141/88, 147/90, 141/88, 117/81, 129/76, 142/82 HR 60-77  Wt Readings  from Last 3 Encounters:  10/03/20 210 lb (95.3 kg)  08/10/20 211 lb 9.6 oz (96 kg)  07/31/20 214 lb (97.1 kg)   BP Readings from Last 3 Encounters:  10/22/20 114/84  10/03/20 132/76  08/20/20 105/65   Pulse Readings from Last 3 Encounters:  10/22/20 (!) 56  10/03/20 68  08/20/20 63    Renal function: CrCl cannot be calculated (Unknown ideal weight.).  Past Medical History:  Diagnosis Date  . Asthma   . Cardiomegaly   . CHF (congestive heart failure) (HCC)   . Cough   . Diabetes (HCC)   . Dyspnea   . History of swelling of feet   . Hypertension   . SOB (shortness of breath)   . Swelling of ankle     Current Outpatient Medications on File Prior to Visit  Medication Sig Dispense Refill  . aspirin EC 81 MG tablet Take 81 mg by mouth daily. Swallow whole.    08/22/20 atenolol (TENORMIN) 100 MG tablet Take 1 tablet (100 mg total) by mouth daily. 90 tablet 3  . calcium-vitamin D (OSCAL WITH D) 500-200 MG-UNIT tablet Take 1 tablet by mouth.    . cyclobenzaprine (FLEXERIL) 10 MG tablet Take 1 tablet (10 mg total) by  mouth 2 (two) times daily as needed for muscle spasms. 10 tablet 0  . furosemide (LASIX) 20 MG tablet Take 1 tablet (20 mg total) by mouth daily. 90 tablet 3  . glimepiride (AMARYL) 2 MG tablet Take 2 mg by mouth 2 (two) times daily.    Marland Kitchen guaiFENesin (MUCINEX) 600 MG 12 hr tablet Take by mouth 2 (two) times daily as needed for cough or to loosen phlegm.    . mometasone-formoterol (DULERA) 100-5 MCG/ACT AERO Inhale 2 puffs into the lungs 2 (two) times daily.    . montelukast (SINGULAIR) 10 MG tablet Take 10 mg by mouth daily.    . pantoprazole (PROTONIX) 40 MG tablet Take 40 mg by mouth daily.    . rosuvastatin (CRESTOR) 10 MG tablet Take 10 mg by mouth daily.    Marland Kitchen spironolactone (ALDACTONE) 50 MG tablet Take 1 tablet (50 mg total) by mouth daily. 90 tablet 3  . valsartan (DIOVAN) 160 MG tablet Take 1 tablet (160 mg total) by mouth daily. 90 tablet 3   No current  facility-administered medications on file prior to visit.    Allergies  Allergen Reactions  . Glipizide Other (See Comments) and Rash  . Hydroxychloroquine Other (See Comments) and Rash  . Ibuprofen Shortness Of Breath and Other (See Comments)  . Metformin Other (See Comments)  . Amlodipine Swelling  . Metformin And Related   . Saline Nasal Spray   . Zyrtec [Cetirizine]     There were no vitals taken for this visit.   Assessment/Plan:  1. Hypertension - Blood pressure is at goal of <130/80 in clinic today. Blood pressure readings at home vary with some in the 140's and a few at goal. Patient reports this is because she was in pain. I have asked patient to continue to check her blood pressure at home. She is to call me if it is consistently >130/80. Continue valsartan 160mg  daily, spironolactone 50mg  daily, furosemide 20mg  daily, atenolol 100mg  daily. Can increase valsartan in the future if needed. We discussed diet, reading food labels, avoiding processed foods and sweetened beverages.  Thank you  , Pharm.D, BCPS, CPP [facility]  [address] [place], [name]  Phone: [phone]; Fax: [phone]

## 2020-11-07 NOTE — Patient Instructions (Signed)
It was great to see you today!  Please continue valsartan 160mg  daily, spironolactone 50mg  daily, furosemide 20mg  daily, atenolol 100mg  daily  Your blood pressure goal is <130/80. Please call me if your blood pressure is consistently >130/80  Hypertension "High blood pressure"  Hypertension is often called "The Silent Killer." It rarely causes symptoms until it is extremely  high or has done damage to other organs in the body. For this reason, you should have your  blood pressure checked regularly by your physician. We will check your blood pressure  every time you see a provider at one of our offices.   Your blood pressure reading consists of two numbers. Ideally, blood pressure should be  below 120/80. The first ("top") number is called the systolic pressure. It measures the  pressure in your arteries as your heart beats. The second ("bottom") number is called the diastolic pressure. It measures the pressure in your arteries as the heart relaxes between beats.  The benefits of getting your blood pressure under control are enormous. A 10-point  reduction in systolic blood pressure can reduce your risk of stroke by 27% and heart failure by 28%  Your blood pressure goal is <130/80  To check your pressure at home you will need to:  1. Sit up in a chair, with feet flat on the floor and back supported. Do not cross your ankles or legs. 2. Rest your left arm so that the cuff is about heart level. If the cuff goes on your upper arm,  then just relax the arm on the table, arm of the chair or your lap. If you have a wrist cuff, we  suggest relaxing your wrist against your chest (think of it as Pledging the Flag with the  wrong arm).  3. Place the cuff snugly around your arm, about 1 inch above the crook of your elbow. The  cords should be inside the groove of your elbow.  4. Sit quietly, with the cuff in place, for about 5 minutes. After that 5 minutes press the power  button to start a  reading. 5. Do not talk or move while the reading is taking place.  6. Record your readings on a sheet of paper. Although most cuffs have a memory, it is often  easier to see a pattern developing when the numbers are all in front of you.  7. You can repeat the reading after 1-3 minutes if it is recommended  Make sure your bladder is empty and you have not had caffeine or tobacco within the last 30 min  Always bring your blood pressure log with you to your appointments. If you have not brought your monitor in to be double checked for accuracy, please bring it to your next appointment.  You can find a list of validated (accurate) blood pressure cuffs at  Healthy Diet  SALT  What is the big deal with sodium? Why the need to limit our intake? What is the connection to  blood pressure? And what is the difference between salt and sodium? Sodium attracts water. Think about it. When you eat an overly salty snack, you tend to become  thirsty and need more water. If you have too much sodium in your bloodstream, your body will  then pull water into the bloodstream as well, trying to correct the imbalance. When you have  more volume in the bloodstream, your blood pressure goes up. Your heart must work harder to  pump the extra volume, and  the increase in pressure can wear out blood vessels faster. You may  also notice bloating and weight gain. Hypertension is one of the leading risk factors for heart  disease. By limiting sodium intake throughout life you are helping decrease your risk of heart  disease later on.   Salt is made up of two minerals. Sodium and chloride. A teaspoon of salt contains about 40%  sodium and 60% chloride. One teaspoon of salt has 2,300 mg of sodium. While the current  USDA guideline states you should consume no more than 2,300 mg per day, both they and the  American Heart Association recommend that you limit this to 1,500 mg to stay healthy. Sea salt  or  [place] pink salt may have a slightly different taste, but they still have almost the same  percent of sodium per teaspoon. So feel free to use them instead of table salt, but don't use  more.  A common myth is that if you don't add salt to your food, you are following a low sodium diet.  However, 75% of the sodium consumed in the American diet is from processed foods, NOT the  salt shaker. We all know that chips and crackers are high in sodium, but there are many other  foods that we may not think of when limiting our sodium. Below are the "salty six" foods that  the American Heart Association wants you to be aware of. 1. Cold cuts - even the healthy sliced [place] can have over 1,000 mg of sodium per slice.   Compare different brands to see which has less sodium if you eat these regularly 2. Pizza - depending on your toppings, a slice of pizza can have up to 760 mg of   sodium. Put more veggies on it or just have a slice with a side salad and still enjoy. 3. Soup - yes even that old home remedy of chicken soup is loaded with sodium. Look   for low sodium versions. Or add a bunch of frozen veggies when heating it, this will   give you less sodium per serving 4. Breads - they may not taste salty, but a single slice of bread can have up to 230 mg of   sodium. Toast for breakfast, a sandwich at lunch and a dinner roll can quickly add up   to over 900 mg in just one day.  5. Chicken - some fresh or frozen chicken is injected with a sodium solution before it   reaches the store. A 4 oz serving should have no more than 100 mg sodium. And   watch for breaded frozen chicken nuggets, strips and tenders. They may seem like a   quick and easy "healthy" meal, but they have high amounts of sodium as well. 6. Burritos/tacos - just 2 teaspoons of taco seasoning can have over 400 mg sodium.   Try making your own with equal parts of cumin, oregano, chili powder and garlic powder.   SUGAR  Sugar is a  huge problem in the modern day diet. Sugar is a HUGE contributor to heart disease, diabetes, high triglyceride levels, fatty liver diease and obesity. Sugar is hidden in almost all packaged foods/beverages. It adds no nutritional benefit to your body and can cause major harm. Added sugar is extra sugar that is added beyond what is naturally found. The American Heart Association recommends limiting added sugars to no more than 25g for women and 36 grams for men per day.  There  are many names for sugar maltose, sucrose (names ending in "ose"), high fructose corn syrup, molasses, cane sugar, corn sweetener, raw sugar, syrup, honey or fruit juice concentrate.   One of the best ways to limit your added sugars is to stop drinking sweetened beverages such as soda, sweet tea, fruit juice or fancy coffee's. There is 65g of added sugars in one 20oz bottle of Coke!! That is equal to 6 donuts.   Pay attention and read all nutrition facts labels. Below is an examples of a nutrition facts label. The #1 is showing you the total sugars where the # 2 is showing you the added sugars. This one severing has almost the max amount of added sugars per day!  Watch out for items that say "low fat" or "no added sugar" as these products are typically very high in sugar. The food industry uses these terms to fool you into thinking they are healthy.  For more information on the dangers of sugar watch WHY Sugar is as Bad as Alcohol (Fructose, The Liver Toxin) on YouTube.    EXERCISE  Exercise can help lower your blood pressure ~5 points systolic (top #) and 8 points diastolic (bottom #)  Exercise is good. We've all heard that. In an ideal world, we would all have time and resources to  get plenty of it. When you are active your heart pumps more efficiently and you will feel better.  Multiple studies show that even walking regularly has benefits that include living a longer life.  The American Heart Association recommends  90-150 minutes per week of exercise (30 minutes  per day most days of the week). You can do this in any increment you wish. Nine or more  10-minute walks count. So does an hour-long exercise class. Break the time apart into what will  work in your life. Some of the best things you can do include walking briskly, jogging, cycling or  swimming laps. Not everyone is ready to "exercise." Sometimes we need to start with just getting active. Here  are some easy ways to be more active throughout the day: Marland Kitchen Take the stairs instead of the elevator . Go for a 10-15 minute walk during your lunch break (find a friend to make it more enjoyable) . When shopping, park at the back of the parking lot . If you take public transportation, get off one stop early and walk the extra distance . Pace around while making phone calls (most of [place] are not attached to phone cords any longer!) Check with your doctor if you aren't sure what your limitations may be. Always remember to drink plenty of water when doing any type of exercise. Don't feel like a failure if you're not getting the 90-150 minutes per week. If you started by being  a couch potato, then just a 10-minute walk each day is a huge improvement. Start with little  victories and work your way up.   Healthy Eating Tips  When looking to improve your eating habits, whether to lose weight, lower blood pressure or just be healthier, it helps to know what a serving size is.   Grains 1 slice of bread,  bagel,  cup pasta or rice  Vegetables 1 cup fresh or raw vegetables,  cup cooked or canned Fruits 1 piece of medium sized fruit,  cup canned,   Meats/Proteins  cup dried       1 oz meat, 1 egg,  cup cooked beans, nuts or seeds  Dairy        Fats Individual yogurt container, 1 cup (8oz)    1 teaspoon margarine/butter or vegetable  milk or milk alternative, 1 slice of cheese          oil; 1 tablespoon mayonnaise or salad dressing                  Plan  ahead: make a menu of the meals for a week then create a grocery list to go with  that menu. Consider meals that easily stretch into a night of leftovers, such as stews or  casseroles. Or consider making two of your favorite meal and put one in the freezer or fridge for  another night.  When you get home from the grocery store wash and prepare your vegetables and fruits.  Then when you need them they are ready to go.  Tips for going to the grocery store: . Buy store or generic brands . Check the weekly ad from your store on-line or in their in-store flyer . Look at the unit price on the shelf tag to compare/contrast the costs of different items . Buy fruits/vegetables in season . Carrots, bananas and apples are low-cost, naturally healthy items . If meats or frozen vegetables are on sale, buy some extras and put in your freezer . Limit buying prepared or "ready to eat" items, even if they are pre-made salads or fruit snacks . Do not shop when you're hungry . Foods at eye level tend to be more expensive. Look on the high and low shelves for deals. . Consider shopping at the farmer's market for fresh foods in season. . Choose canned tuna or salmon instead of fresh . Avoid the cookie and chip aisles (these are expensive, high in calories and low in  nutritional value) Healthy food preparations: . If you can't get lean hamburger, be sure to drain the fat when cooking . Steam, saut (in olive oil), grill or bake foods . Experiment with different seasonings to avoid adding salt to your foods. Kosher salt, sea salt and [place] salt are all still salt and should be avoided          Aim to have one 12 hour fast each day. This means no eating after dinner until breakfast. For example, if you eat dinner around 6 PM then you would not eat anything until 6 AM the next day. This is a great way to help lower your insulin levels, lose weight and reduce your blood pressure.  Resources: American Heart  Association - InstantFinish.fi Go to the Healthy Living tab to get more information American Diabetes Association - www.diabetes.org You don't have to be diabetic - check out the Food and Fitness tab  DASH diet - https://wilson-eaton.com/ Health topics - or just search on their home page for DASH Quit for Life - www.cancer.org Follow the Stay Healthy tab to learn more about smoking cessatio

## 2020-11-21 ENCOUNTER — Ambulatory Visit: Payer: Medicare Other

## 2020-11-21 ENCOUNTER — Other Ambulatory Visit: Payer: Self-pay | Admitting: Family Medicine

## 2020-11-21 ENCOUNTER — Other Ambulatory Visit: Payer: Self-pay

## 2020-11-21 ENCOUNTER — Ambulatory Visit
Admission: RE | Admit: 2020-11-21 | Discharge: 2020-11-21 | Disposition: A | Discharge status: Home / Self Care | Payer: Medicare Other | Source: Ambulatory Visit | Attending: Family Medicine | Admitting: Family Medicine

## 2020-11-21 DIAGNOSIS — Z1231 Encounter for screening mammogram for malignant neoplasm of breast: Secondary | ICD-10-CM

## 2020-11-21 DIAGNOSIS — R921 Mammographic calcification found on diagnostic imaging of breast: Secondary | ICD-10-CM

## 2020-11-21 IMAGING — MG MM DIGITAL SCREENING BILAT W/ TOMO AND CAD
8 of 15 series · 8 of 40 positions shown · non-contrast
Comparison: Previous exam(s).

ACR Breast Density Category a: The breast tissue is almost entirely
fatty.

CLINICAL DATA: Screening.

EXAM:
DIGITAL SCREENING BILATERAL MAMMOGRAM WITH TOMOSYNTHESIS AND CAD
TECHNIQUE: Bilateral screening digital craniocaudal and mediolateral oblique
mammograms were obtained. Bilateral screening digital breast
tomosynthesis was performed. The images were evaluated with
computer-aided detection.

[L MLO synth-2D (1 of 2)]
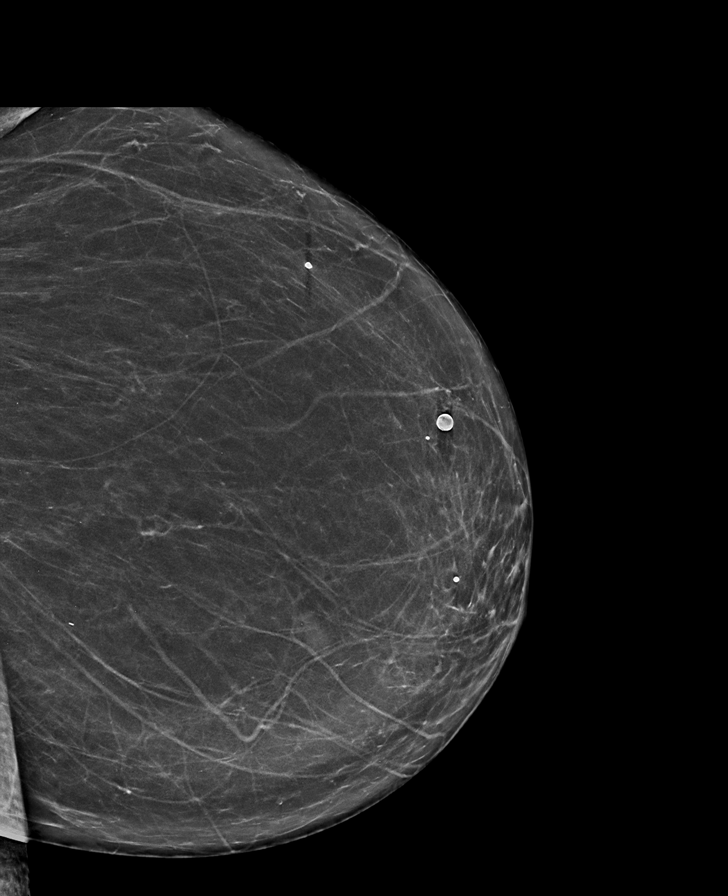

[L MLO synth-2D (2 of 2)]
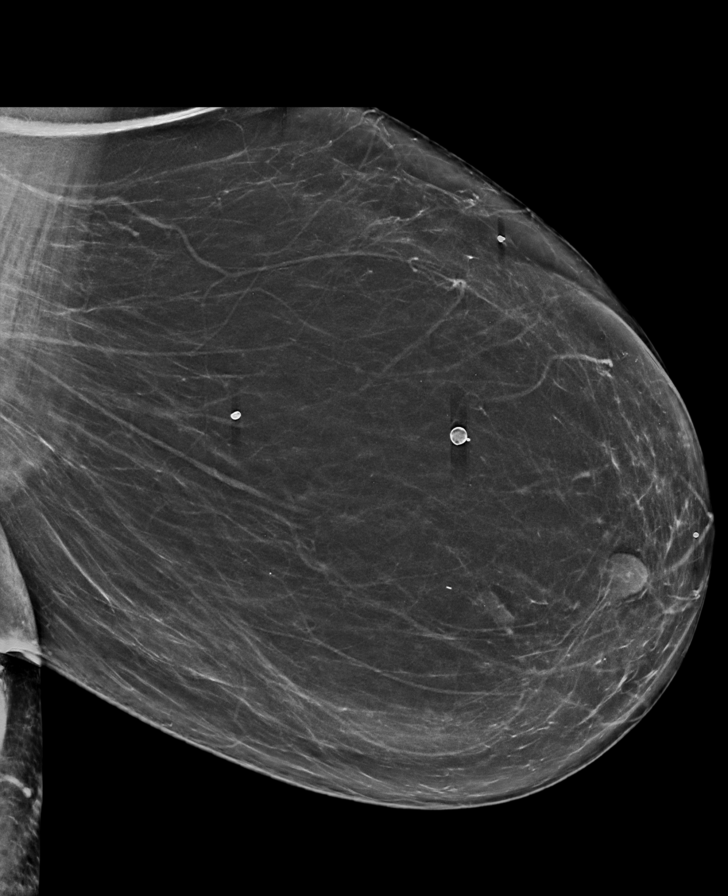

[R MLO synth-2D (1 of 2)]
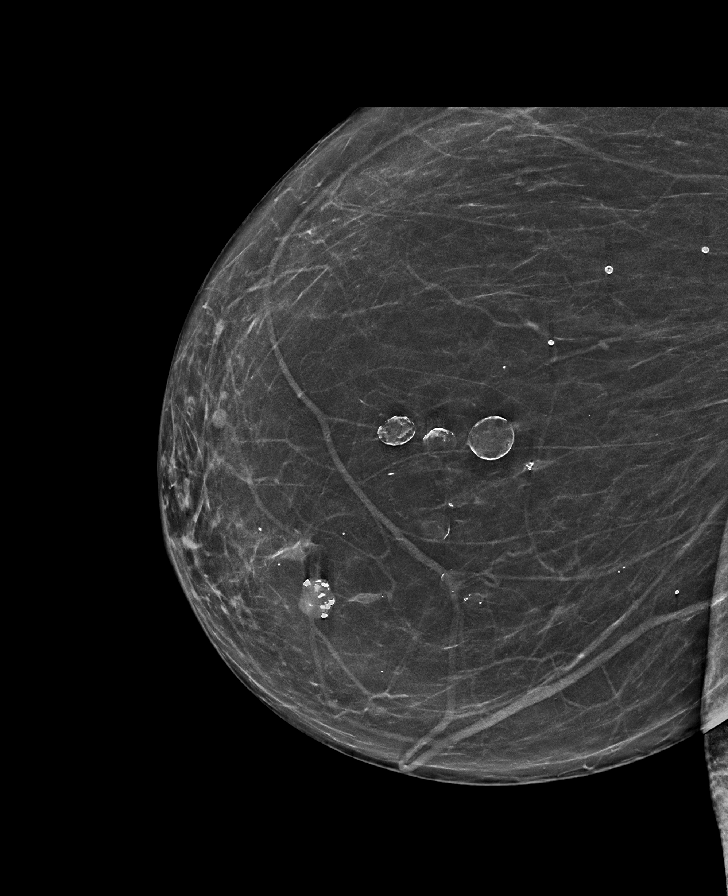

[R CC synth-2D (1 of 2)]
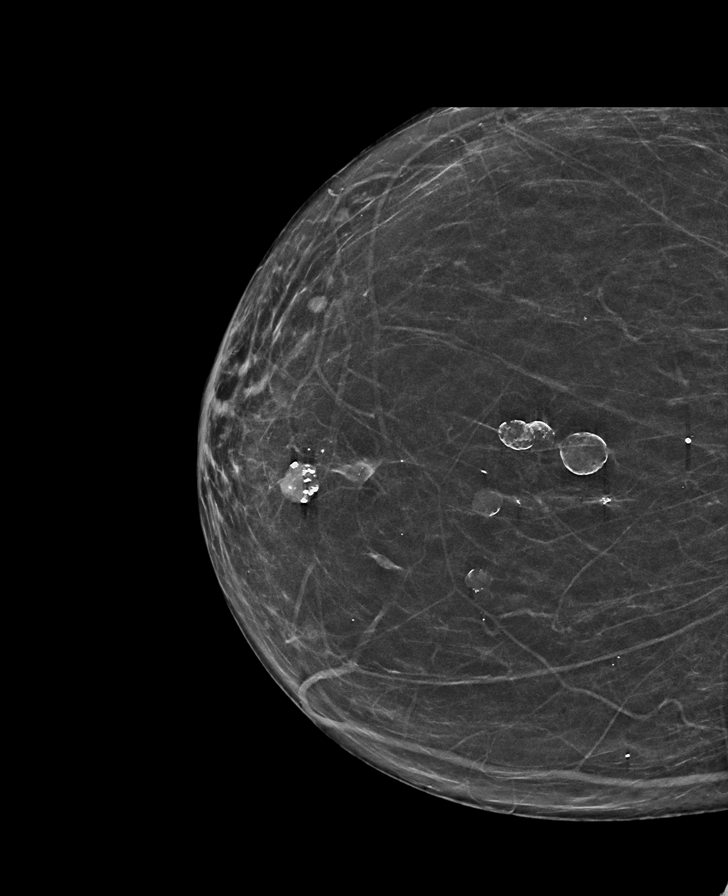

[R CC synth-2D (2 of 2)]
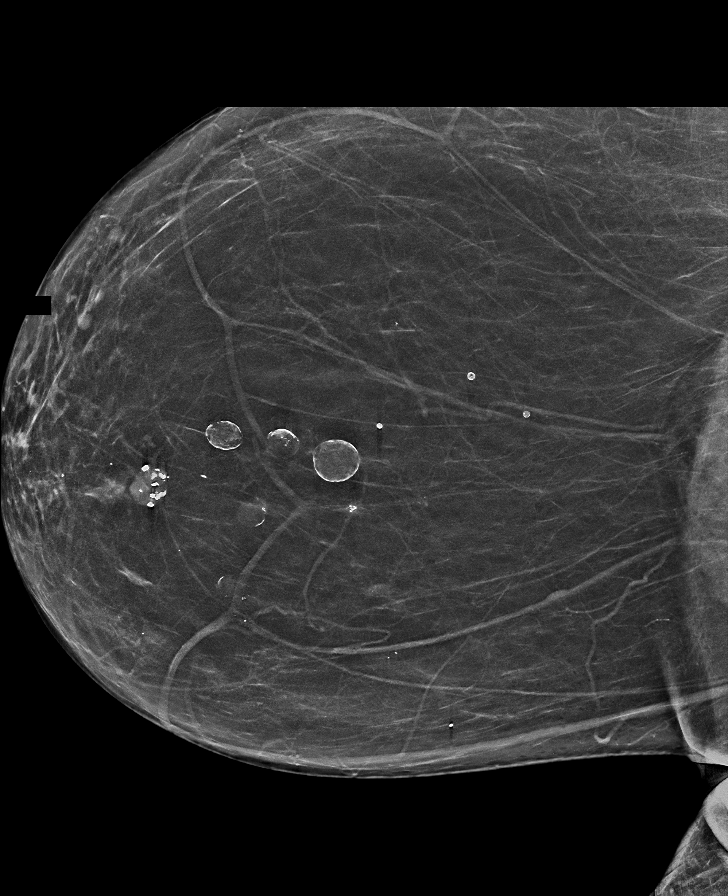

[L CC synth-2D]
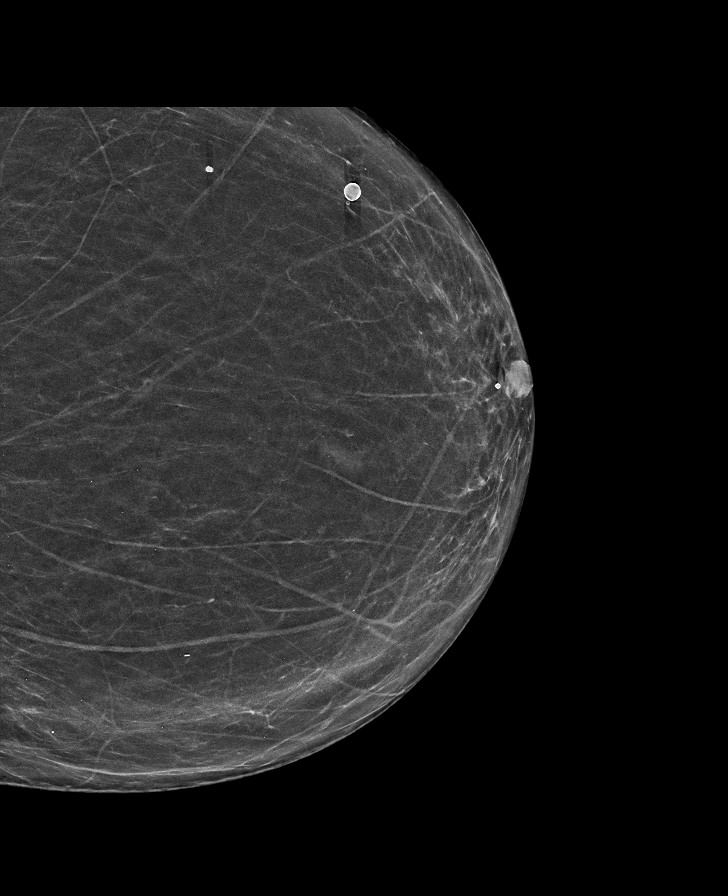

[R MLO synth-2D (2 of 2)]
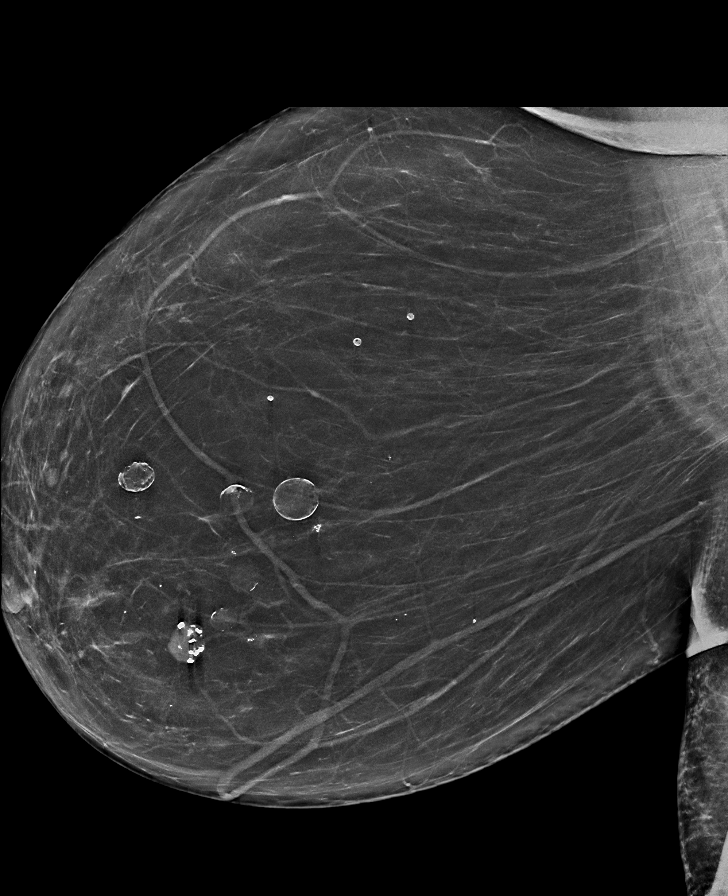

[R MLO tomo · tomo slice 49/71.0]
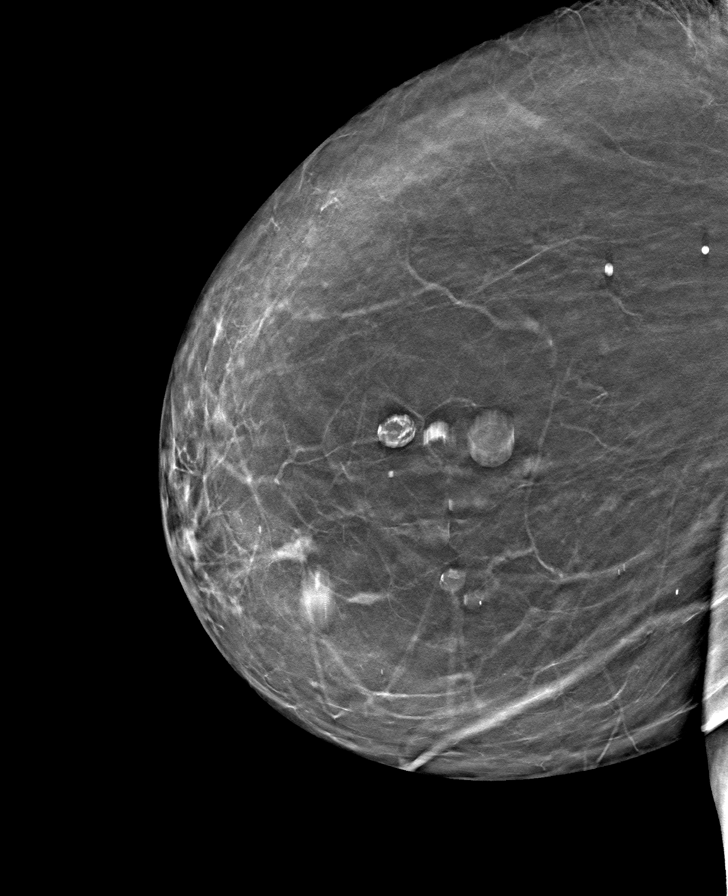

[8 of 40 positions shown; findings below may reference images not displayed]

FINDINGS: There are no findings suspicious for malignancy.
IMPRESSION: No mammographic evidence of malignancy. A result letter of this
screening mammogram will be mailed directly to the patient.

RECOMMENDATION:
Screening mammogram in one year. (Code:[8U])

BI-RADS CATEGORY  1: Negative.

## 2020-11-23 DIAGNOSIS — G5603 Carpal tunnel syndrome, bilateral upper limbs: Secondary | ICD-10-CM | POA: Diagnosis not present

## 2020-11-23 DIAGNOSIS — G5623 Lesion of ulnar nerve, bilateral upper limbs: Secondary | ICD-10-CM | POA: Diagnosis not present

## 2020-12-03 DIAGNOSIS — M542 Cervicalgia: Secondary | ICD-10-CM | POA: Diagnosis not present

## 2020-12-25 DIAGNOSIS — M4802 Spinal stenosis, cervical region: Secondary | ICD-10-CM | POA: Diagnosis not present

## 2020-12-25 DIAGNOSIS — M5416 Radiculopathy, lumbar region: Secondary | ICD-10-CM | POA: Diagnosis not present

## 2021-01-03 DIAGNOSIS — M5416 Radiculopathy, lumbar region: Secondary | ICD-10-CM | POA: Diagnosis not present

## 2021-01-11 DIAGNOSIS — G5603 Carpal tunnel syndrome, bilateral upper limbs: Secondary | ICD-10-CM | POA: Diagnosis not present

## 2021-01-11 DIAGNOSIS — G5623 Lesion of ulnar nerve, bilateral upper limbs: Secondary | ICD-10-CM | POA: Diagnosis not present

## 2021-01-25 DIAGNOSIS — R109 Unspecified abdominal pain: Secondary | ICD-10-CM | POA: Diagnosis not present

## 2021-02-15 DIAGNOSIS — G5601 Carpal tunnel syndrome, right upper limb: Secondary | ICD-10-CM | POA: Diagnosis not present

## 2021-02-15 DIAGNOSIS — G5602 Carpal tunnel syndrome, left upper limb: Secondary | ICD-10-CM | POA: Diagnosis not present

## 2021-02-15 DIAGNOSIS — G5603 Carpal tunnel syndrome, bilateral upper limbs: Secondary | ICD-10-CM | POA: Diagnosis not present

## 2021-02-20 DIAGNOSIS — I10 Essential (primary) hypertension: Secondary | ICD-10-CM | POA: Diagnosis not present

## 2021-02-20 DIAGNOSIS — E1169 Type 2 diabetes mellitus with other specified complication: Secondary | ICD-10-CM | POA: Diagnosis not present

## 2021-02-20 DIAGNOSIS — E785 Hyperlipidemia, unspecified: Secondary | ICD-10-CM | POA: Diagnosis not present

## 2021-02-28 DIAGNOSIS — I10 Essential (primary) hypertension: Secondary | ICD-10-CM | POA: Diagnosis not present

## 2021-02-28 DIAGNOSIS — E1169 Type 2 diabetes mellitus with other specified complication: Secondary | ICD-10-CM | POA: Diagnosis not present

## 2021-03-05 DIAGNOSIS — T6191XA Toxic effect of unspecified seafood, accidental (unintentional), initial encounter: Secondary | ICD-10-CM | POA: Diagnosis not present

## 2021-03-05 DIAGNOSIS — L509 Urticaria, unspecified: Secondary | ICD-10-CM | POA: Diagnosis not present

## 2021-03-14 DIAGNOSIS — T7840XA Allergy, unspecified, initial encounter: Secondary | ICD-10-CM | POA: Diagnosis not present

## 2021-03-14 DIAGNOSIS — R531 Weakness: Secondary | ICD-10-CM | POA: Diagnosis not present

## 2021-03-14 DIAGNOSIS — E1165 Type 2 diabetes mellitus with hyperglycemia: Secondary | ICD-10-CM | POA: Diagnosis not present

## 2021-03-19 DIAGNOSIS — G5603 Carpal tunnel syndrome, bilateral upper limbs: Secondary | ICD-10-CM | POA: Diagnosis not present

## 2021-03-22 DIAGNOSIS — I10 Essential (primary) hypertension: Secondary | ICD-10-CM | POA: Diagnosis not present

## 2021-04-05 DIAGNOSIS — E119 Type 2 diabetes mellitus without complications: Secondary | ICD-10-CM | POA: Diagnosis not present

## 2021-05-07 DIAGNOSIS — I10 Essential (primary) hypertension: Secondary | ICD-10-CM | POA: Diagnosis not present

## 2021-05-07 DIAGNOSIS — N1831 Chronic kidney disease, stage 3a: Secondary | ICD-10-CM | POA: Diagnosis not present

## 2021-05-07 DIAGNOSIS — G5603 Carpal tunnel syndrome, bilateral upper limbs: Secondary | ICD-10-CM | POA: Diagnosis not present

## 2021-05-07 DIAGNOSIS — E785 Hyperlipidemia, unspecified: Secondary | ICD-10-CM | POA: Diagnosis not present

## 2021-05-07 DIAGNOSIS — E1169 Type 2 diabetes mellitus with other specified complication: Secondary | ICD-10-CM | POA: Diagnosis not present

## 2021-05-20 DIAGNOSIS — M1711 Unilateral primary osteoarthritis, right knee: Secondary | ICD-10-CM | POA: Diagnosis not present

## 2021-05-21 DIAGNOSIS — K5900 Constipation, unspecified: Secondary | ICD-10-CM | POA: Diagnosis not present

## 2021-05-21 DIAGNOSIS — L853 Xerosis cutis: Secondary | ICD-10-CM | POA: Diagnosis not present

## 2021-05-21 DIAGNOSIS — M4800 Spinal stenosis, site unspecified: Secondary | ICD-10-CM | POA: Diagnosis not present

## 2021-05-25 DIAGNOSIS — M5416 Radiculopathy, lumbar region: Secondary | ICD-10-CM | POA: Diagnosis not present

## 2021-06-14 DIAGNOSIS — G5601 Carpal tunnel syndrome, right upper limb: Secondary | ICD-10-CM | POA: Diagnosis not present

## 2021-06-21 DIAGNOSIS — E1165 Type 2 diabetes mellitus with hyperglycemia: Secondary | ICD-10-CM | POA: Diagnosis not present

## 2021-06-21 DIAGNOSIS — L309 Dermatitis, unspecified: Secondary | ICD-10-CM | POA: Diagnosis not present

## 2021-06-21 DIAGNOSIS — I10 Essential (primary) hypertension: Secondary | ICD-10-CM | POA: Diagnosis not present

## 2021-06-21 DIAGNOSIS — M546 Pain in thoracic spine: Secondary | ICD-10-CM | POA: Diagnosis not present

## 2021-06-25 DIAGNOSIS — G5601 Carpal tunnel syndrome, right upper limb: Secondary | ICD-10-CM | POA: Diagnosis not present

## 2021-06-25 DIAGNOSIS — Z4789 Encounter for other orthopedic aftercare: Secondary | ICD-10-CM | POA: Diagnosis not present

## 2021-07-01 DIAGNOSIS — M1711 Unilateral primary osteoarthritis, right knee: Secondary | ICD-10-CM | POA: Diagnosis not present

## 2021-07-02 ENCOUNTER — Other Ambulatory Visit: Payer: Self-pay | Admitting: Internal Medicine

## 2021-07-03 NOTE — Progress Notes (Signed)
Cardiology Office Note:    Date:  07/04/2021   ID:  Donna HenceHelena S Broz, DOB 1946-01-14, MRN [id number]  PCP:  Shon Haleimberlake, Kathryn S, MD  [facility] Cardiologist:  Christell ConstantMahesh A Summerlynn Glauser, MD  [facility] Electrophysiologist:  None   CC: Follow up DOE  History of Present Illness:    Donna Garner is a 76 y.o. female with a hx of Diabetes with HTN, Asthma, with history [place] HF[place]EF and [place] [facility] [place] who presented for evaluation 05/08/20.  In interim, we [facility] but despite requesting records they were never received.  Had chest pain in 2022- CT showed moderate non obstructive CAD with normal FFR.  Has worked with [facility] with improvement in BP.Seen 07/04/21.  Patient notes that she is doing well.   Since last visit she has been keeping her BP under control Goes to a lot of doctor's visits. There are no interval hospital/ED visit.   About to start PT for the back.  No chest pain or pressure.  No SOB/DOE and no PND/Orthopnea.  Sometimes worse with her sinuses. No weight gain or leg swelling.  No palpitations or syncope.  Past Medical History:  Diagnosis Date   Asthma    Cardiomegaly    CHF (congestive heart failure) (HCC)    Cough    Diabetes (HCC)    Dyspnea    History of swelling of feet    Hypertension    SOB (shortness of breath)    Swelling of ankle     No past surgical history on file.  Current Medications: Current Meds  Medication Sig   albuterol (VENTOLIN HFA) 108 (90 Base) MCG/ACT inhaler    aspirin EC 81 MG tablet Take 81 mg by mouth daily. Swallow whole.   atenolol (TENORMIN) 100 MG tablet Take 1 tablet (100 mg total) by mouth daily.   Cholecalciferol (VITAMIN D3) 50 MCG (2000 UT) TABS 1 tablet   fluticasone (FLONASE) 50 MCG/ACT nasal spray 1 spray in each nostril   furosemide (LASIX) 20 MG tablet TAKE 1 TABLET(20 MG) BY MOUTH DAILY   guaiFENesin (MUCINEX) 600 MG 12 hr tablet Take  by mouth 2 (two) times daily as needed for cough or to loosen phlegm.   mometasone-formoterol (DULERA) 100-5 MCG/ACT AERO Inhale 2 puffs into the lungs 2 (two) times daily.   montelukast (SINGULAIR) 10 MG tablet Take 10 mg by mouth daily.   OZEMPIC, 0.25 OR 0.5 MG/DOSE, 2 MG/1.5ML SOPN Inject 0.5 mg into the skin once a week.   pantoprazole (PROTONIX) 40 MG tablet Take 40 mg by mouth daily.   rosuvastatin (CRESTOR) 10 MG tablet Take 10 mg by mouth daily.   tiZANidine (ZANAFLEX) 4 MG tablet Take 4 mg by mouth 3 (three) times daily as needed.   traMADol (ULTRAM) 50 MG tablet tramadol 50 mg tablet  Take 1 tablet 3 times a day by oral route as needed for pain for 5 days.   valsartan (DIOVAN) 160 MG tablet Take 1 tablet (160 mg total) by mouth daily.   [DISCONTINUED] spironolactone (ALDACTONE) 50 MG tablet Take 1 tablet (50 mg total) by mouth daily.    Allergies:   Glipizide, Hydroxychloroquine, Ibuprofen, Metformin, Amlodipine, Metformin and related, Saline nasal spray, and Zyrtec [cetirizine]   Social History   Socioeconomic History   Marital status: Legally Separated    Spouse name: Not on file   Number of children: Not on file  Years of education: Not on file   Highest education level: Not on file  Occupational History   Not on file  Tobacco Use   Smoking status: Former   Smokeless tobacco: Never  Substance and Sexual Activity   Alcohol use: Never   Drug use: Never   Sexual activity: Not on file  Other Topics Concern   Not on file  Social History Narrative   Not on file   Social Determinants of Health   Financial Resource Strain: Not on file  Food Insecurity: Not on file  Transportation Needs: Not on file  Physical Activity: Not on file  Stress: Not on file  Social Connections: Not on file    SOCIAL: Daughter's name Is Clyde Canterbury who comes to every visit.  Family History: The patient's brother and sister died from heart disease NOS  ROS:   Please see the history of  present illness.    All other systems reviewed and are negative.  EKGs/Labs/Other Studies Reviewed:    The following studies were reviewed today:  EKG:   04/17/20:  SR rate 82 no ST/T changes  Transthoracic Echocardiogram: Date: 06/05/20 Results: Basal Septal Hypertrophy without SAM or evidence of LVOT gradient 1. Left ventricular ejection fraction, by estimation, is 65 to 70%. Left  ventricular ejection fraction by 3D volume is 69 %. The left ventricle has  normal function. The left ventricle has no regional wall motion  abnormalities. There is severe left  ventricular hypertrophy of the basal-septal segment. Left ventricular  diastolic parameters are consistent with Grade I diastolic dysfunction  (impaired relaxation). Elevated left ventricular end-diastolic pressure.  The average left ventricular global  longitudinal strain is -17.7 %. The global longitudinal strain is normal.   2. Right ventricular systolic function is normal. The right ventricular  size is normal. There is normal pulmonary artery systolic pressure. The  estimated right ventricular systolic pressure is 0000000 mmHg.   3. The mitral valve is normal in structure. Mild mitral valve  regurgitation. No evidence of mitral stenosis.   4. The aortic valve is tricuspid. Aortic valve regurgitation is trivial.  Mild aortic valve sclerosis is present, with no evidence of aortic valve  stenosis.   5. The inferior vena cava is normal in size with greater than 50%  respiratory variability, suggesting right atrial pressure of 3 mmHg.   Cardiac CT: Date: 08/20/20 Results: IMPRESSION: 1. Coronary calcium score of 255. This was 83rd percentile for age, sex, and race matched control.   2. Normal coronary origin.  Right dominance.   3. CAD-RADS 3. Moderate stenosis in the LAD and LCX. Consider symptom-guided anti-ischemic pharmacotherapy as well as risk factor modification per guideline directed care. Additional analysis  with CT FFR will be submitted.   4. Aortic atherosclerosis noted.   PA dilation Normal FFR.  Vascular Venous Duplex: Date: 08/02/20 Results: Summary:  Bilateral:  - No evidence of deep vein thrombosis seen in the lower extremities,  bilaterally, from the common femoral through the mid posterior tibial and  peroneal veins.  - No evidence of deep venous insufficiency seen bilaterally in the lower  extremity.  - No evidence of superficial venous reflux seen in the greater saphenous  veins bilaterally.  - No evidence of superficial venous reflux seen in the short saphenous  veins bilaterally.     Right:  - Fluid seen around the posterior aspect of tibia and joint space. No  Baker's cyst seen.     Left:  - Fluid seen around  the posterior aspect of tibia and joint space. No  Baker's cyst seen.     Recent Labs: 10/22/2020: BUN 14; Creatinine, Ser 0.84; Hemoglobin 13.7; Platelets 216; Potassium 4.3; Sodium 136  Recent Lipid Panel No results found for: CHOL, TRIG, HDL, CHOLHDL, VLDL, LDLCALC, LDLDIRECT   Physical Exam:    VS:  BP 130/72 (BP Location: Left Arm, Patient Position: Sitting, Cuff Size: Large)    Pulse 78    Ht 5\' 7"  (1.702 m)    Wt 92.9 kg    SpO2 97%    BMI 32.08 kg/m     Wt Readings from Last 3 Encounters:  07/04/21 92.9 kg  10/03/20 95.3 kg  08/10/20 96 kg    Gen: no distress   Neck: No JVD Cardiac: No Rubs or Gallops, no murmur, RRR +2 radial pulses Respiratory: Clear to auscultation bilaterally, normal effort, normal  respiratory rate GI: Soft, nontender, non-distended  MS: No  edema (was wearing compression socks);  moves all extremities Integument: Skin feels warm Neuro:  At time of evaluation, alert and oriented to person/place/time/situation  Psych: Normal affect, patient feels warm   ASSESSMENT:    1. Hypertension, unspecified type   2. Chronic heart failure with preserved ejection fraction ([place])   3. 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  Concerns regarding medicines are outlined above.  No orders of the defined types were placed in this encounter.  Meds ordered this encounter  Medications   spironolactone (ALDACTONE) 50 MG tablet    Sig: Take 1 tablet (50 mg total) by mouth daily.    Dispense:  90 tablet    Refill:  3    Patient Instructions  Medication Instructions:  Your physician recommends that you continue on your current medications as directed. Please refer to the Current Medication list given to you today. REFILLED: spironolactone (Aldactone)  *If you need a refill on your cardiac medications before your next appointment, please call your pharmacy*   Lab Work: NONE If you have labs (blood work) drawn today and your tests are completely normal, you will receive your results only by: [place] (if you have MyChart) OR A paper copy in the mail If you have any lab test that is abnormal or we need to change your treatment, we will call you to review the results.   Testing/Procedures: NONE   Follow-Up: At [facility], you and your health needs are our priority.  As part of our continuing mission to provide you with exceptional heart care, we have created designated Provider Care Teams.  These Care Teams include your primary Cardiologist (physician) and Advanced Practice Providers (APPs -  Physician Assistants and Nurse Practitioners) who all work together to provide you with the care you  need, when you need it.   Your next appointment:   6 month(s)  The format for your next appointment:   In Person  Provider:   Lyda Kalata A  Gasper Sells, MD        Signed, Werner Lean, MD  07/04/2021 3:34 PM    [place]

## 2021-07-04 ENCOUNTER — Ambulatory Visit: Payer: Medicare Other | Admitting: Internal Medicine

## 2021-07-04 ENCOUNTER — Encounter: Payer: Self-pay | Admitting: Internal Medicine

## 2021-07-04 ENCOUNTER — Other Ambulatory Visit: Payer: Self-pay

## 2021-07-04 VITALS — BP 130/72 | HR 78 | Ht 67.0 in | Wt 204.8 lb

## 2021-07-04 DIAGNOSIS — I5032 Chronic diastolic (congestive) heart failure: Secondary | ICD-10-CM

## 2021-07-04 DIAGNOSIS — E119 Type 2 diabetes mellitus without complications: Secondary | ICD-10-CM

## 2021-07-04 DIAGNOSIS — I10 Essential (primary) hypertension: Secondary | ICD-10-CM

## 2021-07-04 MED ORDER — SPIRONOLACTONE 50 MG PO TABS: 50.0000 mg | ORAL_TABLET | Freq: Every day | ORAL | 3 refills | Status: DC

## 2021-07-04 NOTE — Patient Instructions (Signed)
Medication Instructions:  Your physician recommends that you continue on your current medications as directed. Please refer to the Current Medication list given to you today. REFILLED: spironolactone (Aldactone)  *If you need a refill on your cardiac medications before your next appointment, please call your pharmacy*   Lab Work: NONE If you have labs (blood work) drawn today and your tests are completely normal, you will receive your results only by: [place] (if you have MyChart) OR A paper copy in the mail If you have any lab test that is abnormal or we need to change your treatment, we will call you to review the results.   Testing/Procedures: NONE   Follow-Up: At [facility], you and your health needs are our priority.  As part of our continuing mission to provide you with exceptional heart care, we have created designated Provider Care Teams.  These Care Teams include your primary Cardiologist (physician) and Advanced Practice Providers (APPs -  Physician Assistants and Nurse Practitioners) who all work together to provide you with the care you need, when you need it.   Your next appointment:   6 month(s)  The format for your next appointment:   In Person  Provider:   Werner Lean, MD

## 2021-07-08 DIAGNOSIS — M1711 Unilateral primary osteoarthritis, right knee: Secondary | ICD-10-CM | POA: Diagnosis not present

## 2021-07-15 DIAGNOSIS — M1711 Unilateral primary osteoarthritis, right knee: Secondary | ICD-10-CM | POA: Diagnosis not present

## 2021-07-19 DIAGNOSIS — M6281 Muscle weakness (generalized): Secondary | ICD-10-CM | POA: Diagnosis not present

## 2021-07-19 DIAGNOSIS — M5459 Other low back pain: Secondary | ICD-10-CM | POA: Diagnosis not present

## 2021-07-19 DIAGNOSIS — M546 Pain in thoracic spine: Secondary | ICD-10-CM | POA: Diagnosis not present

## 2021-07-22 DIAGNOSIS — J069 Acute upper respiratory infection, unspecified: Secondary | ICD-10-CM | POA: Diagnosis not present

## 2021-07-23 ENCOUNTER — Other Ambulatory Visit: Payer: Self-pay | Admitting: Internal Medicine

## 2021-07-26 DIAGNOSIS — H524 Presbyopia: Secondary | ICD-10-CM | POA: Diagnosis not present

## 2021-07-26 DIAGNOSIS — L503 Dermatographic urticaria: Secondary | ICD-10-CM | POA: Diagnosis not present

## 2021-07-29 DIAGNOSIS — M546 Pain in thoracic spine: Secondary | ICD-10-CM | POA: Diagnosis not present

## 2021-07-29 DIAGNOSIS — M6281 Muscle weakness (generalized): Secondary | ICD-10-CM | POA: Diagnosis not present

## 2021-07-29 DIAGNOSIS — M5459 Other low back pain: Secondary | ICD-10-CM | POA: Diagnosis not present

## 2021-08-02 DIAGNOSIS — M5459 Other low back pain: Secondary | ICD-10-CM | POA: Diagnosis not present

## 2021-08-02 DIAGNOSIS — M546 Pain in thoracic spine: Secondary | ICD-10-CM | POA: Diagnosis not present

## 2021-08-02 DIAGNOSIS — M6281 Muscle weakness (generalized): Secondary | ICD-10-CM | POA: Diagnosis not present

## 2021-08-08 DIAGNOSIS — M6281 Muscle weakness (generalized): Secondary | ICD-10-CM | POA: Diagnosis not present

## 2021-08-08 DIAGNOSIS — M546 Pain in thoracic spine: Secondary | ICD-10-CM | POA: Diagnosis not present

## 2021-08-08 DIAGNOSIS — M5459 Other low back pain: Secondary | ICD-10-CM | POA: Diagnosis not present

## 2021-08-13 DIAGNOSIS — F419 Anxiety disorder, unspecified: Secondary | ICD-10-CM | POA: Diagnosis not present

## 2021-08-13 DIAGNOSIS — R42 Dizziness and giddiness: Secondary | ICD-10-CM | POA: Diagnosis not present

## 2021-08-13 DIAGNOSIS — R110 Nausea: Secondary | ICD-10-CM | POA: Diagnosis not present

## 2021-08-14 DIAGNOSIS — E1165 Type 2 diabetes mellitus with hyperglycemia: Secondary | ICD-10-CM | POA: Diagnosis not present

## 2021-08-14 DIAGNOSIS — F411 Generalized anxiety disorder: Secondary | ICD-10-CM | POA: Diagnosis not present

## 2021-08-19 DIAGNOSIS — M6281 Muscle weakness (generalized): Secondary | ICD-10-CM | POA: Diagnosis not present

## 2021-08-19 DIAGNOSIS — M5459 Other low back pain: Secondary | ICD-10-CM | POA: Diagnosis not present

## 2021-08-19 DIAGNOSIS — M546 Pain in thoracic spine: Secondary | ICD-10-CM | POA: Diagnosis not present

## 2021-08-27 DIAGNOSIS — M1711 Unilateral primary osteoarthritis, right knee: Secondary | ICD-10-CM | POA: Diagnosis not present

## 2021-09-03 DIAGNOSIS — M6281 Muscle weakness (generalized): Secondary | ICD-10-CM | POA: Diagnosis not present

## 2021-09-03 DIAGNOSIS — M546 Pain in thoracic spine: Secondary | ICD-10-CM | POA: Diagnosis not present

## 2021-09-03 DIAGNOSIS — M5459 Other low back pain: Secondary | ICD-10-CM | POA: Diagnosis not present

## 2021-09-16 DIAGNOSIS — E1165 Type 2 diabetes mellitus with hyperglycemia: Secondary | ICD-10-CM | POA: Diagnosis not present

## 2021-09-16 DIAGNOSIS — J4540 Moderate persistent asthma, uncomplicated: Secondary | ICD-10-CM | POA: Diagnosis not present

## 2021-09-16 DIAGNOSIS — N1831 Chronic kidney disease, stage 3a: Secondary | ICD-10-CM | POA: Diagnosis not present

## 2021-09-16 DIAGNOSIS — I10 Essential (primary) hypertension: Secondary | ICD-10-CM | POA: Diagnosis not present

## 2021-09-17 DIAGNOSIS — E1169 Type 2 diabetes mellitus with other specified complication: Secondary | ICD-10-CM | POA: Diagnosis not present

## 2021-09-17 DIAGNOSIS — Z0000 Encounter for general adult medical examination without abnormal findings: Secondary | ICD-10-CM | POA: Diagnosis not present

## 2021-09-17 DIAGNOSIS — I10 Essential (primary) hypertension: Secondary | ICD-10-CM | POA: Diagnosis not present

## 2021-09-17 DIAGNOSIS — E785 Hyperlipidemia, unspecified: Secondary | ICD-10-CM | POA: Diagnosis not present

## 2021-09-27 ENCOUNTER — Telehealth: Payer: Self-pay | Admitting: Internal Medicine

## 2021-09-27 DIAGNOSIS — J0100 Acute maxillary sinusitis, unspecified: Secondary | ICD-10-CM | POA: Diagnosis not present

## 2021-09-27 NOTE — Telephone Encounter (Signed)
Daughter called in to report SOB and palpitations with exertion that started 3-4 days ago.  Pt reports head heaviness, HA and dizziness .  Started on trulicity 1.5 months ago and recently had a tetanus shot.   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  Reports takes meds as ordered.  Last BP reading 09/25/21- 127/84-64.  Has not come in contact with anyone who is sick.  Does not weigh self daily denies swelling to extremities.   ?Advised pt if symptoms persistent or worsen to report to ED for evaluation.  Scheduled DOD OV for Monday 09/30/21 at 9:30 am.   ? ?

## 2021-09-27 NOTE — Telephone Encounter (Signed)
Pt c/o Shortness Of Breath: STAT if SOB developed within the last 24 hours or pt is noticeably SOB on the phone ? ?1. Are you currently SOB (can you hear that pt is SOB on the phone)? A little  ? ?2. How long have you been experiencing SOB? About 4 days ago ? ?3. Are you SOB when sitting or when up moving around? A little when sitting. More when she is up and moving around  ? ?4. Are you currently experiencing any other symptoms? Palpitations, dizziness  ? ?Patient c/o Palpitations:  High priority if patient c/o lightheadedness, shortness of breath, or chest pain ? ?How long have you had palpitations/irregular HR/ Afib? Started about 4 days ago ? ?Are you having the symptoms now? no ? ?Are you currently experiencing lightheadedness, SOB or CP? A little SOB ? ?Do you have a history of afib (atrial fibrillation) or irregular heart rhythm?  ? ?Have you checked your BP or HR? (document readings if available): "normal" per daughter that called  ? ?Are you experiencing any other symptoms? See above ?

## 2021-09-29 ENCOUNTER — Emergency Department (HOSPITAL_COMMUNITY)
Admission: EM | Admit: 2021-09-29 | Discharge: 2021-09-29 | Disposition: A | Discharge status: Home / Self Care | Payer: Medicare Other | Attending: Emergency Medicine | Admitting: Emergency Medicine

## 2021-09-29 ENCOUNTER — Encounter (HOSPITAL_COMMUNITY): Payer: Self-pay | Admitting: Emergency Medicine

## 2021-09-29 ENCOUNTER — Emergency Department (HOSPITAL_COMMUNITY): Payer: Medicare Other

## 2021-09-29 ENCOUNTER — Other Ambulatory Visit: Payer: Self-pay

## 2021-09-29 DIAGNOSIS — R0602 Shortness of breath: Secondary | ICD-10-CM | POA: Diagnosis not present

## 2021-09-29 DIAGNOSIS — I509 Heart failure, unspecified: Secondary | ICD-10-CM | POA: Diagnosis not present

## 2021-09-29 DIAGNOSIS — R42 Dizziness and giddiness: Secondary | ICD-10-CM | POA: Insufficient documentation

## 2021-09-29 DIAGNOSIS — Z7985 Long-term (current) use of injectable non-insulin antidiabetic drugs: Secondary | ICD-10-CM | POA: Diagnosis not present

## 2021-09-29 DIAGNOSIS — R531 Weakness: Secondary | ICD-10-CM | POA: Diagnosis not present

## 2021-09-29 DIAGNOSIS — Z7982 Long term (current) use of aspirin: Secondary | ICD-10-CM | POA: Insufficient documentation

## 2021-09-29 DIAGNOSIS — E119 Type 2 diabetes mellitus without complications: Secondary | ICD-10-CM | POA: Diagnosis not present

## 2021-09-29 DIAGNOSIS — R002 Palpitations: Secondary | ICD-10-CM | POA: Diagnosis not present

## 2021-09-29 DIAGNOSIS — R0789 Other chest pain: Secondary | ICD-10-CM | POA: Diagnosis not present

## 2021-09-29 DIAGNOSIS — I110 Hypertensive heart disease with heart failure: Secondary | ICD-10-CM | POA: Insufficient documentation

## 2021-09-29 DIAGNOSIS — Z20822 Contact with and (suspected) exposure to covid-19: Secondary | ICD-10-CM | POA: Diagnosis not present

## 2021-09-29 DIAGNOSIS — Z79899 Other long term (current) drug therapy: Secondary | ICD-10-CM | POA: Diagnosis not present

## 2021-09-29 DIAGNOSIS — R079 Chest pain, unspecified: Secondary | ICD-10-CM | POA: Diagnosis not present

## 2021-09-29 DIAGNOSIS — R0600 Dyspnea, unspecified: Secondary | ICD-10-CM | POA: Diagnosis not present

## 2021-09-29 LAB — COMPREHENSIVE METABOLIC PANEL
ALT: 30 U/L (ref 0–44)
AST: 30 U/L (ref 15–41)
Albumin: 4 g/dL (ref 3.5–5.0)
Alkaline Phosphatase: 30 U/L — ABNORMAL LOW (ref 38–126)
Anion gap: 11 (ref 5–15)
BUN: 16 mg/dL (ref 8–23)
CO2: 20 mmol/L — ABNORMAL LOW (ref 22–32)
Calcium: 9.8 mg/dL (ref 8.9–10.3)
Chloride: 105 mmol/L (ref 98–111)
Creatinine, Ser: 1.11 mg/dL — ABNORMAL HIGH (ref 0.44–1.00)
GFR, Estimated: 52 mL/min — ABNORMAL LOW (ref >60)
Glucose, Bld: 98 mg/dL (ref 70–99)
Potassium: 3.7 mmol/L (ref 3.5–5.1)
Sodium: 136 mmol/L (ref 135–145)
Total Bilirubin: 0.8 mg/dL (ref 0.3–1.2)
Total Protein: 6.9 g/dL (ref 6.5–8.1)

## 2021-09-29 LAB — CBC WITH DIFFERENTIAL/PLATELET
Abs Immature Granulocytes: 0.03 10*3/uL (ref 0.00–0.07)
Basophils Absolute: 0 10*3/uL (ref 0.0–0.1)
Basophils Relative: 0 %
Eosinophils Absolute: 0.1 10*3/uL (ref 0.0–0.5)
Eosinophils Relative: 1 %
HCT: 43.8 % (ref 36.0–46.0)
Hemoglobin: 14.7 g/dL (ref 12.0–15.0)
Immature Granulocytes: 0 %
Lymphocytes Relative: 34 %
Lymphs Abs: 2.4 10*3/uL (ref 0.7–4.0)
MCH: 29.2 pg (ref 26.0–34.0)
MCHC: 33.6 g/dL (ref 30.0–36.0)
MCV: 87.1 fL (ref 80.0–100.0)
Monocytes Absolute: 0.7 10*3/uL (ref 0.1–1.0)
Monocytes Relative: 9 %
Neutro Abs: 4 10*3/uL (ref 1.7–7.7)
Neutrophils Relative %: 56 %
Platelets: 214 10*3/uL (ref 150–400)
RBC: 5.03 MIL/uL (ref 3.87–5.11)
RDW: 16.2 % — ABNORMAL HIGH (ref 11.5–15.5)
WBC: 7.2 10*3/uL (ref 4.0–10.5)
nRBC: 0 % (ref 0.0–0.2)

## 2021-09-29 LAB — TROPONIN I (HIGH SENSITIVITY): Troponin I (High Sensitivity): 2 ng/L (ref <18)

## 2021-09-29 LAB — MAGNESIUM: Magnesium: 2.1 mg/dL (ref 1.7–2.4)

## 2021-09-29 IMAGING — DX DG CHEST 1V PORT
1 series · 1 of 1 positions shown · non-contrast
Comparison: [DATE]

CLINICAL DATA: Weakness, dizziness, shortness of breath

EXAM:
PORTABLE CHEST 1 VIEW

[chest]
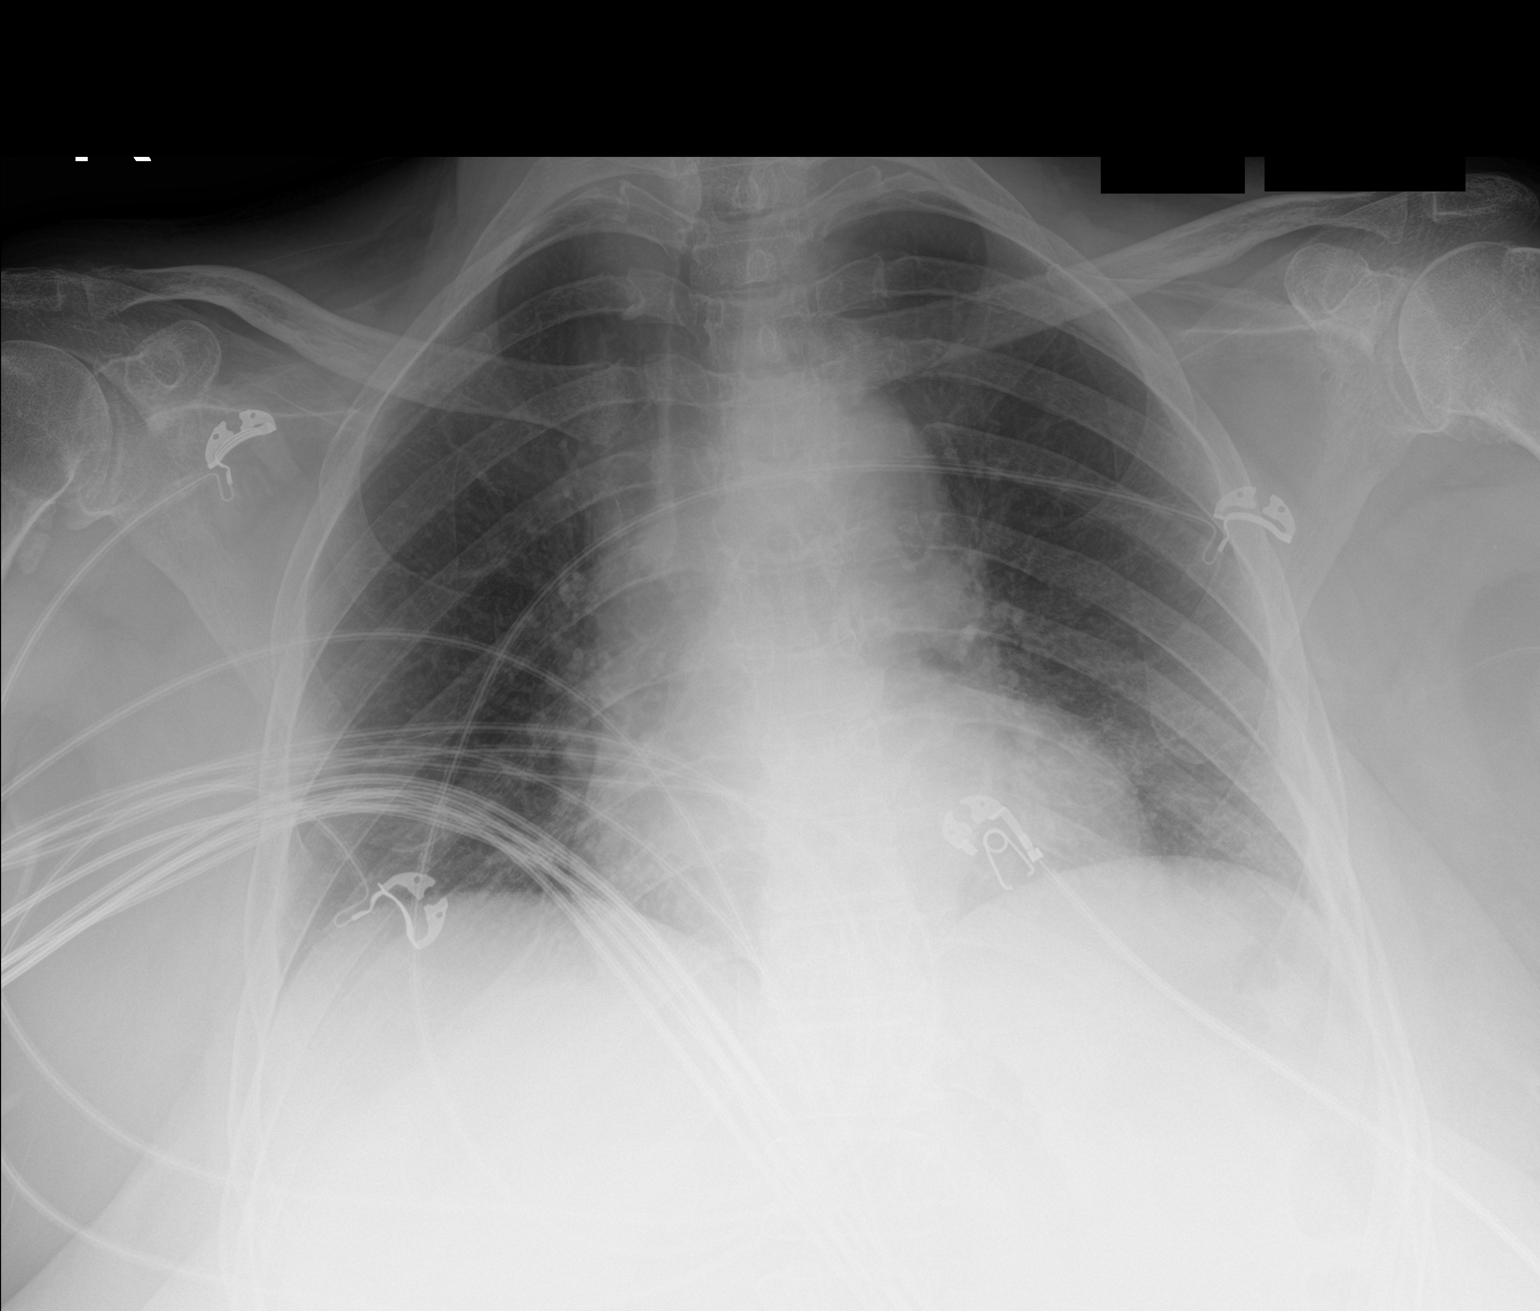

[1 of 1 positions shown; findings below may reference images not displayed]

FINDINGS: Mild cardiomegaly. Both lungs are clear. The visualized skeletal
structures are unremarkable.
IMPRESSION: Mild cardiomegaly without acute abnormality of the lungs in AP
portable projection.

## 2021-09-29 NOTE — ED Notes (Addendum)
Pt took home supply of Singulair 10mg , Crestor 10mg , and Protonix 40mg . Verified with PA.  ?

## 2021-09-29 NOTE — ED Triage Notes (Signed)
Pt BIB [facility] from [facility] due to dizziness, SHOB and palpitations.  Pt endorses dizziness for about a month and SHOB with palpitations for the last week.  Pt saw her PCP this past Friday 09/27/21 for sinus pressure and was prescribed amoxicillin for a sinus infection.  Hx of anxiety.  12 lead unremarkable.   ? ?VS BP 148/78, P 68, SpO2 98%, Resp 20, Temp 98.2, CBG 106 ?

## 2021-09-29 NOTE — ED Notes (Signed)
Patient verbalizes understanding of discharge instructions. Opportunity for questioning and answers were provided. Armband removed by staff, pt discharged from ED.  

## 2021-09-29 NOTE — Progress Notes (Signed)
? ?Chief Complaint  ?Patient presents with  ? Follow-up  ?  Palpitations, dizziness, dyspnea  ? ?History of Present Illness: 76 yo female with history of CAD, diabetes, HTN, asthma, chronic diastolic CHF and LVH here today as an add for evaluation of dizziness and palpitations. She is followed in our office by Dr. Izora Ribas. I am seeing her today as the doctor of the day. She is known to have moderate non-obstructive CAD by coronary CTA in March 2022. Echo December 2021 with LVEF=65-70%, severe LVH. Mild MR.  She was seen in the Urgent Care yesterday with the same complaints. EKG, chest x-ray and labs ok. EKG with sinus rhythm. She tells me today that she has had mild dizziness and palpitations, not occurring simultaneously, for the past few months. She feels that this started after she started Ozempic. She is now off of this and on Trulicity. She feels well most days. The palpitations occur every 2-3 days and feel like a heavy beat with skips but her heart is not racing. No chest pain.  ? ?Primary Care Physician: Shon Hale, MD ?Primary Cardiologist: Dr. Izora Ribas ? ?Past Medical History:  ?Diagnosis Date  ? Asthma   ? Cardiomegaly   ? CHF (congestive heart failure) (HCC)   ? Cough   ? Diabetes (HCC)   ? Dyspnea   ? History of swelling of feet   ? Hypertension   ? SOB (shortness of breath)   ? Swelling of ankle   ? ? ?History reviewed. No pertinent surgical history. ? ?Current Outpatient Medications  ?Medication Sig Dispense Refill  ? albuterol (VENTOLIN HFA) 108 (90 Base) MCG/ACT inhaler     ? aspirin EC 81 MG tablet Take 81 mg by mouth daily. Swallow whole.    ? atenolol (TENORMIN) 100 MG tablet TAKE 1 TABLET(100 MG) BY MOUTH DAILY 90 tablet 3  ? Cholecalciferol (VITAMIN D3) 50 MCG (2000 UT) TABS 1 tablet    ? fluticasone (FLONASE) 50 MCG/ACT nasal spray 1 spray in each nostril    ? furosemide (LASIX) 20 MG tablet TAKE 1 TABLET(20 MG) BY MOUTH DAILY 90 tablet 3  ? guaiFENesin (MUCINEX) 600 MG  12 hr tablet Take by mouth 2 (two) times daily as needed for cough or to loosen phlegm.    ? montelukast (SINGULAIR) 10 MG tablet Take 10 mg by mouth daily.    ? pantoprazole (PROTONIX) 40 MG tablet Take 40 mg by mouth daily.    ? rosuvastatin (CRESTOR) 10 MG tablet Take 10 mg by mouth daily.    ? spironolactone (ALDACTONE) 50 MG tablet Take 1 tablet (50 mg total) by mouth daily. 90 tablet 3  ? tiZANidine (ZANAFLEX) 4 MG tablet Take 4 mg by mouth 3 (three) times daily as needed.    ? traMADol (ULTRAM) 50 MG tablet tramadol 50 mg tablet  Take 1 tablet 3 times a day by oral route as needed for pain for 5 days.    ? valsartan (DIOVAN) 160 MG tablet Take 1 tablet (160 mg total) by mouth daily. 90 tablet 3  ? glimepiride (AMARYL) 2 MG tablet Take 2 mg by mouth 2 (two) times daily. (Patient not taking: Reported on 09/30/2021)    ? mometasone-formoterol (DULERA) 100-5 MCG/ACT AERO Inhale 2 puffs into the lungs 2 (two) times daily. (Patient not taking: Reported on 09/30/2021)    ? TRULICITY 0.75 MG/0.5ML SOPN Inject 0.75 mg as directed once a week.    ? ?No current facility-administered medications for this visit.  ? ? ?  Allergies  ?Allergen Reactions  ? Glipizide Other (See Comments) and Rash  ? Hydroxychloroquine Other (See Comments) and Rash  ? Ibuprofen Shortness Of Breath and Other (See Comments)  ? Metformin Other (See Comments)  ? Amlodipine Swelling  ? Metformin And Related   ? Saline Nasal Spray   ? Zyrtec [Cetirizine]   ? ? ?Social History  ? ?Socioeconomic History  ? Marital status: Legally Separated  ?  Spouse name: Not on file  ? Number of children: Not on file  ? Years of education: Not on file  ? Highest education level: Not on file  ?Occupational History  ? Not on file  ?Tobacco Use  ? Smoking status: Former  ? Smokeless tobacco: Never  ?Substance and Sexual Activity  ? Alcohol use: Never  ? Drug use: Never  ? Sexual activity: Not on file  ?Other Topics Concern  ? Not on file  ?Social History Narrative  ? Not  on file  ? ?Social Determinants of Health  ? ?Financial Resource Strain: Not on file  ?Food Insecurity: Not on file  ?Transportation Needs: Not on file  ?Physical Activity: Not on file  ?Stress: Not on file  ?Social Connections: Not on file  ?Intimate Partner Violence: Not on file  ? ? ?Family History  ?Problem Relation Age of Onset  ? Heart disease Sister   ? Heart disease Brother   ? ? ?Review of Systems:  As stated in the HPI and otherwise negative.  ? ?BP 116/60   Pulse 68   Ht 5\' 7"  (1.702 m)   Wt 197 lb (89.4 kg)   SpO2 98%   BMI 30.85 kg/m?  ? ?Physical Examination: ?General: Well developed, well nourished, NAD  ?HEENT: OP clear, mucus membranes moist  ?SKIN: warm, dry. No rashes. ?Neuro: No focal deficits  ?Musculoskeletal: Muscle strength 5/5 all ext  ?Psychiatric: Mood and affect normal  ?Neck: No JVD, no carotid bruits, no thyromegaly, no lymphadenopathy.  ?Lungs:Clear bilaterally, no wheezes, rhonci, crackles ?Cardiovascular: Regular rate and rhythm. No murmurs, gallops or rubs. ?Abdomen:Soft. Bowel sounds present. Non-tender.  ?Extremities: No lower extremity edema. Pulses are 2 + in the bilateral DP/PT. ? ?EKG:  EKG is not ordered today. ?The ekg ordered today demonstrates  ? ?Recent Labs: ?09/29/2021: ALT 30; BUN 16; Creatinine, Ser 1.11; Hemoglobin 14.7; Magnesium 2.1; Platelets 214; Potassium 3.7; Sodium 136  ? ?Lipid Panel ?No results found for: CHOL, TRIG, HDL, CHOLHDL, VLDL, LDLCALC, LDLDIRECT ?  ?Wt Readings from Last 3 Encounters:  ?09/30/21 197 lb (89.4 kg)  ?09/29/21 198 lb (89.8 kg)  ?07/04/21 204 lb 12.8 oz (92.9 kg)  ?  ? ? ?Assessment and Plan:  ? ?1. Hypertensive heart disease: BP is controlled. No changes in therapy today ? ?2. CAD without angina: Her current symptoms do not sound ischemia related.  ? ?3. Palpitations/Slight Dizziness: Will arrange a 14 day cardiac monitor. Electrolytes ok yesterday on lab work in the Urgent Care.  ? ?Labs/ tests ordered today include:  ? ?Orders  Placed This Encounter  ?Procedures  ? LONG TERM MONITOR (3-14 DAYS)  ? ?Disposition:   F/U with Dr. 07/06/21 in 4-8 weeks.  ? ? ?Signed, ?Izora Ribas, MD ?09/30/2021 10:32 AM    ?[facility] ?[address] [place], [place], [place]  [place] ?Phone: [phone]; Fax: [phone]  ? ? ?

## 2021-09-29 NOTE — Discharge Instructions (Signed)
Please make sure you follow-up with your cardiologist at your scheduled appointment tomorrow.  Please continue to monitor your symptoms closely and return to the emergency department with any new or worsening symptoms.  It was a pleasure to meet you both. ?

## 2021-09-29 NOTE — ED Provider Notes (Signed)
?[facility] EMERGENCY DEPARTMENT ?Provider Note ? ? ?CSN: [id number] ?Arrival date & time: 09/29/21  1455 ? ?  ? ?History ? ?Chief Complaint  ?Patient presents with  ? Dizziness  ? Shortness of Breath  ? Palpitations  ? ? ?Donna Garner is a 76 y.o. female. ? ?HPI ?Patient is a 76 year old female with history of HFpEF, diabetes mellitus, hypertension, who presents to the emergency department due to intermittent lightheadedness, shortness of breath, palpitations for the past week.  Patient states that the symptoms have been ongoing for many years but over the past week began to worsen.  She states that she has been experiencing worsening symptoms since beginning new medications for diabetes mellitus.  She states that she is currently taking Trulicity which she has been on for about a month and she believes that this is the cause of her symptoms but also notes similar symptoms when previously taking Ozempic as well.  Denies syncope or chest pain.  States her symptoms are intermittent and her shortness of breath, lightheadedness, as well as palpitations do not coincide with each other.  When she becomes lightheaded her symptoms will improve when sitting down.  No dizziness.  This morning she states that her symptoms were worse than normal so she went to urgent care initially and was then sent to the emergency department for further evaluation.  Denies any abdominal pain, nausea, vomiting, diarrhea, changes in appetite.  Patient states she has a follow up appointment with her cardiologist tomorrow. ?  ? ?Home Medications ?Prior to Admission medications   ?Medication Sig Start Date End Date Taking? Authorizing Provider  ?albuterol (VENTOLIN HFA) 108 (90 Base) MCG/ACT inhaler  04/05/21   [provider]  ?aspirin EC 81 MG tablet Take 81 mg by mouth daily. Swallow whole.    [provider]  ?atenolol (TENORMIN) 100 MG tablet TAKE 1 TABLET(100 MG) BY MOUTH DAILY 07/24/21   Riley Lam A, MD  ?Cholecalciferol (VITAMIN D3) 50 MCG (2000 UT) TABS 1 tablet    [provider]  ?fluticasone (FLONASE) 50 MCG/ACT nasal spray 1 spray in each nostril 11/07/20   [provider]  ?furosemide (LASIX) 20 MG tablet TAKE 1 TABLET(20 MG) BY MOUTH DAILY 07/02/21   Chandrasekhar, Mahesh A, MD  ?glimepiride (AMARYL) 2 MG tablet Take 2 mg by mouth 2 (two) times daily. ?Patient not taking: Reported on 07/04/2021 08/01/20   [provider]  ?guaiFENesin (MUCINEX) 600 MG 12 hr tablet Take by mouth 2 (two) times daily as needed for cough or to loosen phlegm.    [provider]  ?mometasone-formoterol (DULERA) 100-5 MCG/ACT AERO Inhale 2 puffs into the lungs 2 (two) times daily.    [provider]  ?montelukast (SINGULAIR) 10 MG tablet Take 10 mg by mouth daily. 04/12/20   [provider]  ?OZEMPIC, 0.25 OR 0.5 MG/DOSE, 2 MG/1.5ML SOPN Inject 0.5 mg into the skin once a week. 06/13/21   [provider]  ?pantoprazole (PROTONIX) 40 MG tablet Take 40 mg by mouth daily.    [provider]  ?rosuvastatin (CRESTOR) 10 MG tablet Take 10 mg by mouth daily.    [provider]  ?spironolactone (ALDACTONE) 50 MG tablet Take 1 tablet (50 mg total) by mouth daily. 07/04/21   Christell Constant, MD  ?tiZANidine (ZANAFLEX) 4 MG tablet Take 4 mg by mouth 3 (three) times daily as needed. 05/21/21   [provider]  ?traMADol (ULTRAM) 50 MG tablet tramadol 50  mg tablet  Take 1 tablet 3 times a day by oral route as needed for pain for 5 days.    [provider]  ?valsartan (DIOVAN) 160 MG tablet Take 1 tablet (160 mg total) by mouth daily. 11/06/20   Christell Constant, MD  ?   ? ?Allergies    ?Glipizide, Hydroxychloroquine, Ibuprofen, Metformin, Amlodipine, Metformin and related, Saline nasal spray, and Zyrtec [cetirizine]   ? ?Review of Systems   ?Review of Systems  ?All other systems reviewed and are negative. ?Ten systems reviewed  and are negative for acute change, except as noted in the HPI.   ?Physical Exam ?Updated Vital Signs ?BP 107/72   Pulse 64   Temp 98.9 ?F (37.2 ?C) (Oral)   Resp 16   Ht 5\' 7"  (1.702 m)   Wt 89.8 kg   SpO2 96%   BMI 31.01 kg/m?  ?Physical Exam ?Vitals and nursing note reviewed.  ?Constitutional:   ?   General: She is not in acute distress. ?   Appearance: Normal appearance. She is well-developed. She is not ill-appearing, toxic-appearing or diaphoretic.  ?   Interventions: She is not intubated. ?HENT:  ?   Head: Normocephalic and atraumatic.  ?   Right Ear: External ear normal.  ?   Left Ear: External ear normal.  ?   Nose: Nose normal.  ?   Mouth/Throat:  ?   Mouth: Mucous membranes are moist.  ?   Pharynx: Oropharynx is clear. No oropharyngeal exudate or posterior oropharyngeal erythema.  ?Eyes:  ?   Extraocular Movements: Extraocular movements intact.  ?Cardiovascular:  ?   Rate and Rhythm: Normal rate and regular rhythm.  ?   Pulses: Normal pulses.  ?   Heart sounds: Normal heart sounds. No murmur heard. ?  No friction rub. No gallop.  ?   Comments: RRR without M/R/G. ? ?Orthostatic Vital Signs ?Lying  ?BP- Lying: 118/72 Pulse- Lying: 62 ?Sitting ?BP- Sitting: 127/88 Pulse- Sitting: 67 ?Standing at 0 minutes ?BP- Standing at 0 minutes: 122/86 Pulse- Standing at 0 minutes: 76 ?Standing at 3 minutes ?BP- Standing at 3 minutes: 129/92 (!) Pulse- Standing at 3 minutes: 78 ?Pulmonary:  ?   Effort: Pulmonary effort is normal. No tachypnea, bradypnea, accessory muscle usage or respiratory distress. She is not intubated.  ?   Breath sounds: Normal breath sounds. No stridor. No decreased breath sounds, wheezing, rhonchi or rales.  ?   Comments: LCTAB. ?Abdominal:  ?   General: Abdomen is flat.  ?   Tenderness: There is no abdominal tenderness.  ?Musculoskeletal:     ?   General: Normal range of motion.  ?   Cervical back: Normal range of motion and neck supple. No tenderness.  ?   Right lower leg: No edema.  ?    Left lower leg: No edema.  ?   Comments: No pedal edema.  Palpable pedal pulses.  ?Skin: ?   General: Skin is warm and dry.  ?Neurological:  ?   General: No focal deficit present.  ?   Mental Status: She is alert and oriented to person, place, and time.  ?   Comments: A&O x3.  Speaking clearly and coherently.  Moving all 4 extremities with ease.  No gross deficits.  ?Psychiatric:     ?   Mood and Affect: Mood normal.     ?   Behavior: Behavior normal.  ? ?ED Results / Procedures / Treatments   ?Labs ?(all labs ordered are  listed, but only abnormal results are displayed) ?Labs Reviewed  ?COMPREHENSIVE METABOLIC PANEL - Abnormal; Notable for the following components:  ?    Result Value  ? CO2 20 (*)   ? Creatinine, Ser 1.11 (*)   ? Alkaline Phosphatase 30 (*)   ? GFR, Estimated 52 (*)   ? All other components within normal limits  ?CBC WITH DIFFERENTIAL/PLATELET - Abnormal; Notable for the following components:  ? RDW 16.2 (*)   ? All other components within normal limits  ?MAGNESIUM  ?TROPONIN I (HIGH SENSITIVITY)  ? ?EKG ?EKG Interpretation ? ?Date/Time:  Sunday September 29 2021 15:08:58 EDT ?Ventricular Rate:  64 ?PR Interval:  166 ?QRS Duration: 98 ?QT Interval:  417 ?QTC Calculation: 431 ?R Axis:   -44 ?Text Interpretation: Sinus rhythm Left anterior fascicular block Probable left ventricular hypertrophy nonspecific T waves similar to? May 2022 Confirmed by Pricilla LovelessGoldston, Scott [phone]([id number]) on 09/29/2021 3:28:20 PM ? ?Radiology ?DG Chest Portable 1 View ? ?Result Date: 09/29/2021 ?CLINICAL DATA:  Weakness, dizziness, shortness of breath EXAM: PORTABLE CHEST 1 VIEW COMPARISON:  10/22/2020 FINDINGS: Mild cardiomegaly. Both lungs are clear. The visualized skeletal structures are unremarkable. IMPRESSION: Mild cardiomegaly without acute abnormality of the lungs in AP portable projection. Electronically Signed   By: Jearld LeschAlex D Bibbey M.D.   On: 09/29/2021 16:25   ? ?Procedures ?Procedures  ? ?Medications Ordered in ED ?Medications - No  data to display ? ?ED Course/ Medical Decision Making/ A&P ?  ?                        ?Medical Decision Making ?Amount and/or Complexity of Data Reviewed ?Labs: ordered. ?Radiology: ordered. ? ?Pt is a 76 y.o.

## 2021-09-30 ENCOUNTER — Ambulatory Visit (INDEPENDENT_AMBULATORY_CARE_PROVIDER_SITE_OTHER): Payer: Medicare Other

## 2021-09-30 ENCOUNTER — Encounter: Payer: Self-pay | Admitting: *Deleted

## 2021-09-30 ENCOUNTER — Encounter: Payer: Self-pay | Admitting: Cardiovascular Disease

## 2021-09-30 ENCOUNTER — Ambulatory Visit: Payer: Medicare Other | Admitting: Cardiovascular Disease

## 2021-09-30 VITALS — BP 116/60 | HR 68 | Ht 67.0 in | Wt 197.0 lb

## 2021-09-30 DIAGNOSIS — I2510 Atherosclerotic heart disease of native coronary artery without angina pectoris: Secondary | ICD-10-CM | POA: Diagnosis not present

## 2021-09-30 DIAGNOSIS — R002 Palpitations: Secondary | ICD-10-CM

## 2021-09-30 NOTE — Patient Instructions (Addendum)
Medication Instructions:  ?No changes ?*If you need a refill on your cardiac medications before your next appointment, please call your pharmacy* ? ? ?Lab Work: ?none ? ? ?Testing/Procedures: ?ZIO XT HEART MONITOR -14 days ? ? ?Follow-Up: ?At [facility], you and your health needs are our priority.  As part of our continuing mission to provide you with exceptional heart care, we have created designated Provider Care Teams.  These Care Teams include your primary Cardiologist (physician) and Advanced Practice Providers (APPs -  Physician Assistants and Nurse Practitioners) who all work together to provide you with the care you need, when you need it. ?  ? ?Your next appointment:   ?2-3 months ? ?The format for your next appointment:   ?In Person ? ?Provider:   ?Christell Constant, MD   ? ? ?Other Instructions ? ? ?Important Information About Sugar ? ? ? ? ?  ?

## 2021-09-30 NOTE — Progress Notes (Unsigned)
[id number] zio xt from office inventory applied to patient. ?

## 2021-10-04 DIAGNOSIS — R42 Dizziness and giddiness: Secondary | ICD-10-CM | POA: Diagnosis not present

## 2021-10-04 DIAGNOSIS — R0602 Shortness of breath: Secondary | ICD-10-CM | POA: Diagnosis not present

## 2021-10-04 DIAGNOSIS — R519 Headache, unspecified: Secondary | ICD-10-CM | POA: Diagnosis not present

## 2021-10-09 DIAGNOSIS — J029 Acute pharyngitis, unspecified: Secondary | ICD-10-CM | POA: Diagnosis not present

## 2021-10-09 DIAGNOSIS — J40 Bronchitis, not specified as acute or chronic: Secondary | ICD-10-CM | POA: Diagnosis not present

## 2021-10-09 DIAGNOSIS — J329 Chronic sinusitis, unspecified: Secondary | ICD-10-CM | POA: Diagnosis not present

## 2021-10-14 ENCOUNTER — Other Ambulatory Visit: Payer: Self-pay | Admitting: Family Medicine

## 2021-10-14 DIAGNOSIS — Z789 Other specified health status: Secondary | ICD-10-CM

## 2021-10-15 DIAGNOSIS — R0981 Nasal congestion: Secondary | ICD-10-CM | POA: Diagnosis not present

## 2021-10-15 DIAGNOSIS — J019 Acute sinusitis, unspecified: Secondary | ICD-10-CM | POA: Diagnosis not present

## 2021-10-15 DIAGNOSIS — R059 Cough, unspecified: Secondary | ICD-10-CM | POA: Diagnosis not present

## 2021-10-15 DIAGNOSIS — J40 Bronchitis, not specified as acute or chronic: Secondary | ICD-10-CM | POA: Diagnosis not present

## 2021-10-18 ENCOUNTER — Other Ambulatory Visit: Payer: Self-pay | Admitting: Internal Medicine

## 2021-10-18 DIAGNOSIS — I10 Essential (primary) hypertension: Secondary | ICD-10-CM

## 2021-10-20 ENCOUNTER — Other Ambulatory Visit: Payer: Medicare Other

## 2021-10-21 DIAGNOSIS — M5416 Radiculopathy, lumbar region: Secondary | ICD-10-CM | POA: Diagnosis not present

## 2021-10-21 DIAGNOSIS — M4802 Spinal stenosis, cervical region: Secondary | ICD-10-CM | POA: Diagnosis not present

## 2021-10-27 ENCOUNTER — Ambulatory Visit
Admission: RE | Admit: 2021-10-27 | Discharge: 2021-10-27 | Disposition: A | Discharge status: Home / Self Care | Payer: Medicare Other | Source: Ambulatory Visit | Attending: Family Medicine | Admitting: Family Medicine

## 2021-10-27 ENCOUNTER — Other Ambulatory Visit: Payer: Self-pay | Admitting: Family Medicine

## 2021-10-27 DIAGNOSIS — R519 Headache, unspecified: Secondary | ICD-10-CM | POA: Diagnosis not present

## 2021-10-27 DIAGNOSIS — I6782 Cerebral ischemia: Secondary | ICD-10-CM | POA: Diagnosis not present

## 2021-10-27 DIAGNOSIS — Z789 Other specified health status: Secondary | ICD-10-CM

## 2021-10-27 IMAGING — MR MR HEAD W/O CM
10 series · 48 of 48 positions shown · non-contrast
Comparison: No pertinent prior exams available for comparison.
COMPARISON: No pertinent prior exams available for comparison.

Addendum:
CLINICAL DATA: Provided history: Patient reports headaches for 1
month.

EXAM:
MRI HEAD WITHOUT CONTRAST
TECHNIQUE: Multiplanar, multiecho pulse sequences of the brain and surrounding
structures were obtained without intravenous contrast.

[Series 2: T1 · sagittal · 5.0mm · 0.45mm/px · 1 of 23 slices shown]
[im 1/23]
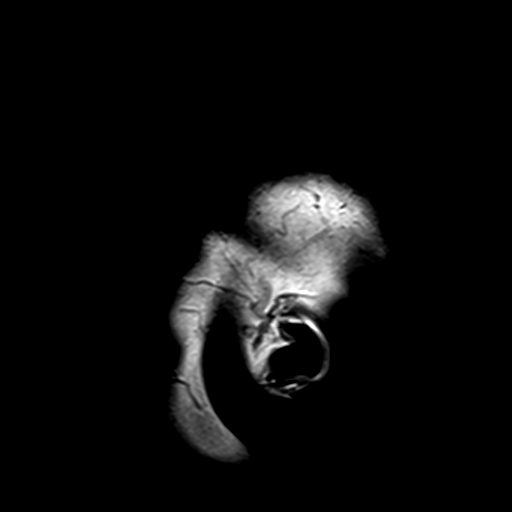

[Series 3: DWI · axial · 3.0mm · 1.80mm/px · z∈[-65,+84]mm · 9 of 101 slices shown (1 of 4)]
[im 1/101]
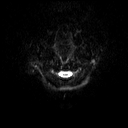
[im 13/101]
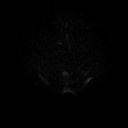
[im 26/101]
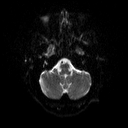
[im 38/101]
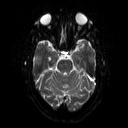
[im 51/101]
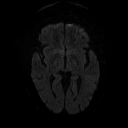
[im 63/101]
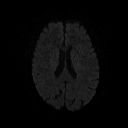
[im 76/101]
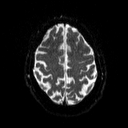
[im 88/101]
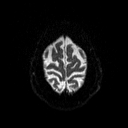
[im 101/101]
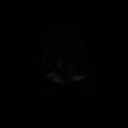

[Series 4: DWI · axial · 3.0mm · 1.80mm/px · z∈[-65,+84]mm · 4 of 51 slices shown (2 of 4)]
[im 1/51]
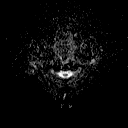
[im 17/51]
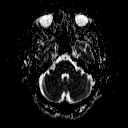
[im 34/51]
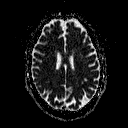
[im 51/51]
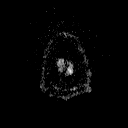

[Series 5: DWI · coronal · 5.0mm · 1.80mm/px · 7 of 75 slices shown (3 of 4)]
[im 1/75]
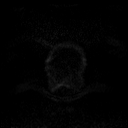
[im 13/75]
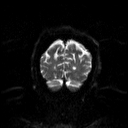
[im 25/75]
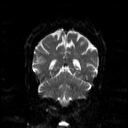
[im 38/75]
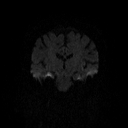
[im 50/75]
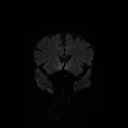
[im 62/75]
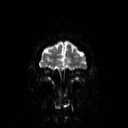
[im 75/75]
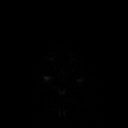

[Series 6: DWI · coronal · 5.0mm · 1.80mm/px · 3 of 38 slices shown (4 of 4)]
[im 1/38]
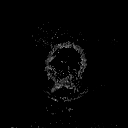
[im 19/38]
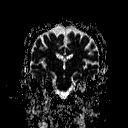
[im 38/38]
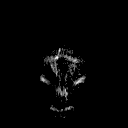

[Series 7: T2 · axial · 5.0mm · 0.72mm/px · z∈[-62,+79]mm · 2 of 22 slices shown]
[im 1/22]
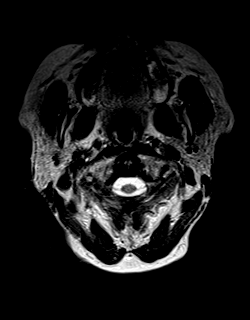
[im 22/22]
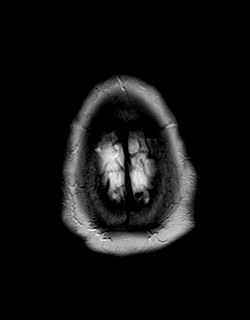

[Series 8: FLAIR · axial · 3.0mm · 0.45mm/px · z∈[-58,+76]mm · 3 of 30 slices shown]
[im 1/30]
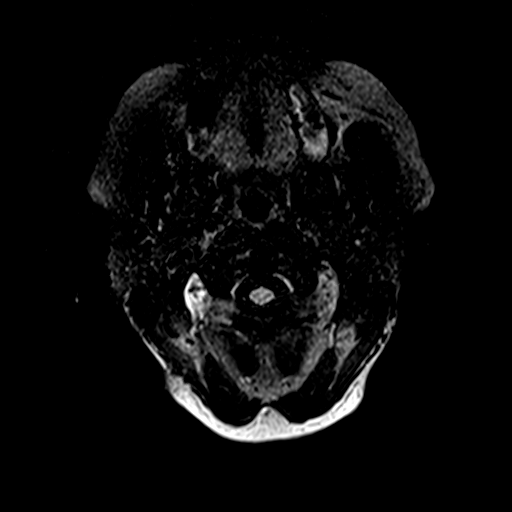
[im 15/30]
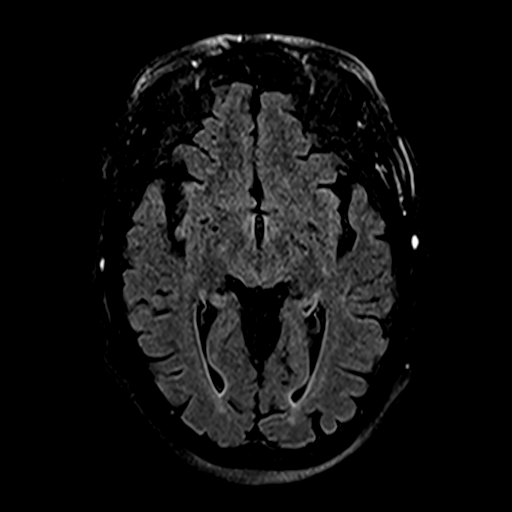
[im 30/30]
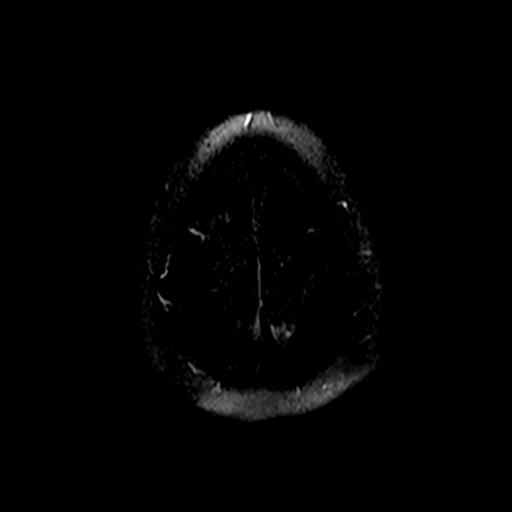

[Series 10: swi_images · axial · 4.0mm · 0.90mm/px · z∈[-61,+79]mm · 3 of 36 slices shown]
[im 1/36]
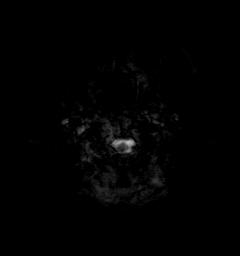
[im 18/36]
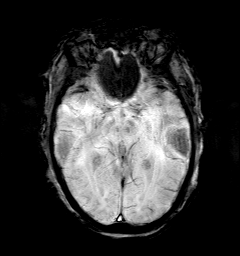
[im 36/36]
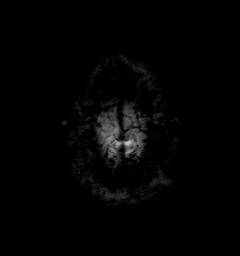

[Series 11: t1_mpr_tra · axial · 1.0mm · 0.75mm/px · z∈[-63,+80]mm · 13 of 144 slices shown]
[im 1/144]
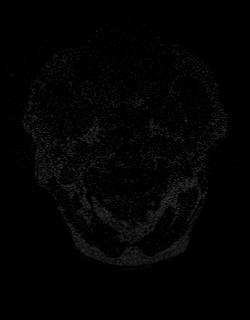
[im 12/144]
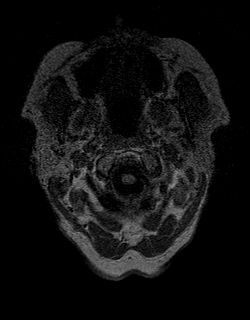
[im 24/144]
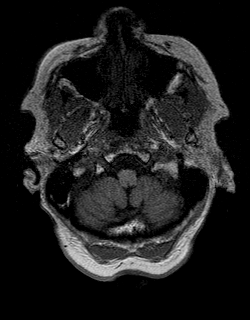
[im 36/144]
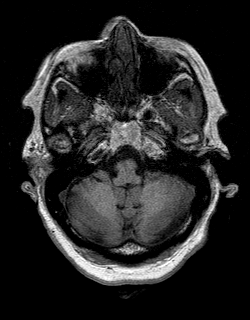
[im 48/144]
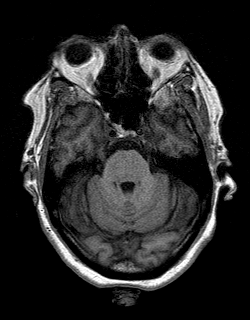
[im 60/144]
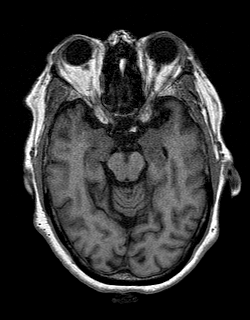
[im 72/144]
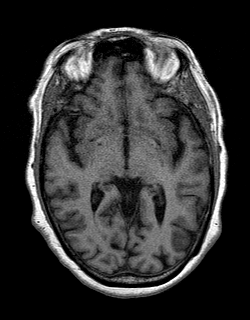
[im 84/144]
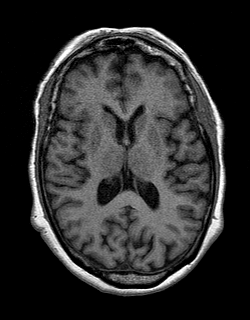
[im 96/144]
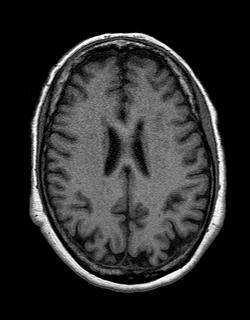
[im 108/144]
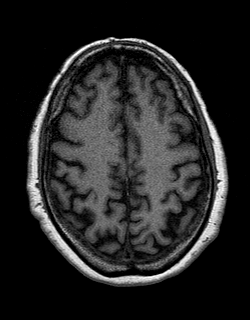
[im 120/144]
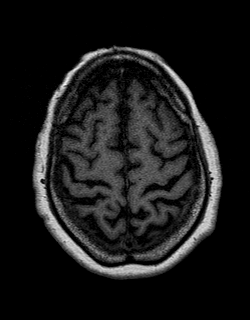
[im 132/144]
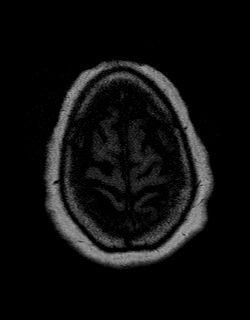
[im 144/144]
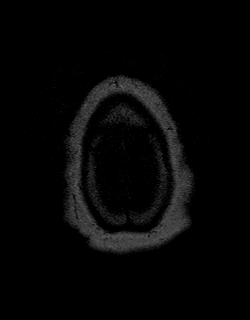

[Series 12: post cor · coronal · 5.0mm · 0.45mm/px · 3 of 29 slices shown]
[im 1/29]
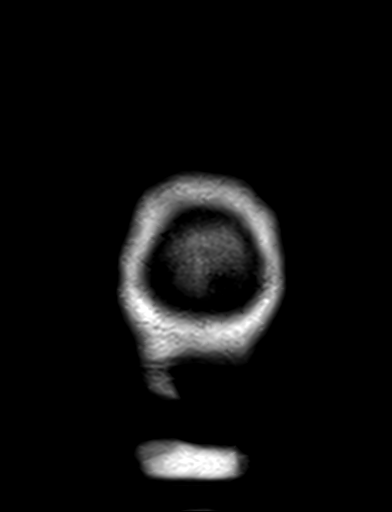
[im 15/29]
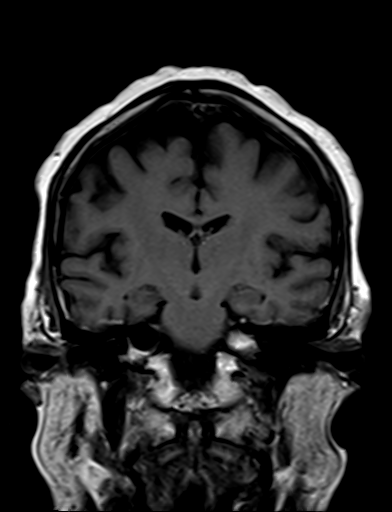
[im 29/29]
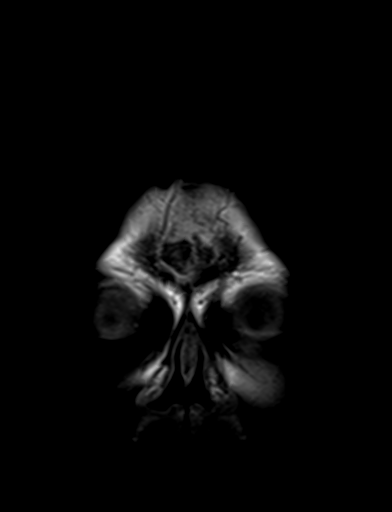

[48 of 48 positions shown; findings below may reference images not displayed]

FINDINGS: Brain:

Mild intermittent motion degradation.

No age advanced or lobar predominant parenchymal atrophy.

Multifocal T2 FLAIR hyperintense signal abnormality within the
cerebral white matter, nonspecific but compatible with mild chronic
small vessel ischemic disease.

There is no acute infarct.

No evidence of an intracranial mass.

No chronic intracranial blood products.

No extra-axial fluid collection.

No midline shift.

Vascular: Maintained flow voids within the proximal large arterial
vessels.

Skull and upper cervical spine: No focal suspicious marrow lesion. 2
mm C2-C3 grade 1 anterolisthesis. Incompletely assessed cervical
spondylosis.

Sinuses/Orbits: No mass or acute finding within the imaged orbits.
Prior bilateral ocular lens replacement. Minimal mucosal thickening
within the bilateral ethmoid and left maxillary sinuses.
IMPRESSION: Mildly motion degraded exam.

No evidence of acute intracranial abnormality.

Mild chronic small vessel ischemic changes within the cerebral white
matter.

Minimal mucosal thickening within the bilateral ethmoid and left
maxillary sinuses.

ADDENDUM:
Please note, the patient refused intravenous contrast
administration, and a non-contrast examination was performed.

*** End of Addendum ***
FINDINGS: Brain:

Mild intermittent motion degradation.

No age advanced or lobar predominant parenchymal atrophy.

Multifocal T2 FLAIR hyperintense signal abnormality within the
cerebral white matter, nonspecific but compatible with mild chronic
small vessel ischemic disease.

There is no acute infarct.

No evidence of an intracranial mass.

No chronic intracranial blood products.

No extra-axial fluid collection.

No midline shift.

Vascular: Maintained flow voids within the proximal large arterial
vessels.

Skull and upper cervical spine: No focal suspicious marrow lesion. 2
mm C2-C3 grade 1 anterolisthesis. Incompletely assessed cervical
spondylosis.

Sinuses/Orbits: No mass or acute finding within the imaged orbits.
Prior bilateral ocular lens replacement. Minimal mucosal thickening
within the bilateral ethmoid and left maxillary sinuses.
IMPRESSION: Mildly motion degraded exam.

No evidence of acute intracranial abnormality.

Mild chronic small vessel ischemic changes within the cerebral white
matter.

Minimal mucosal thickening within the bilateral ethmoid and left
maxillary sinuses.

## 2021-11-18 ENCOUNTER — Other Ambulatory Visit: Payer: Self-pay | Admitting: Family Medicine

## 2021-11-18 DIAGNOSIS — Z1231 Encounter for screening mammogram for malignant neoplasm of breast: Secondary | ICD-10-CM

## 2021-11-22 DIAGNOSIS — M1711 Unilateral primary osteoarthritis, right knee: Secondary | ICD-10-CM | POA: Diagnosis not present

## 2021-12-04 DIAGNOSIS — F3341 Major depressive disorder, recurrent, in partial remission: Secondary | ICD-10-CM | POA: Diagnosis not present

## 2021-12-04 DIAGNOSIS — E1165 Type 2 diabetes mellitus with hyperglycemia: Secondary | ICD-10-CM | POA: Diagnosis not present

## 2021-12-12 DIAGNOSIS — M5412 Radiculopathy, cervical region: Secondary | ICD-10-CM | POA: Diagnosis not present

## 2021-12-16 ENCOUNTER — Other Ambulatory Visit: Payer: Self-pay | Admitting: Physical Medicine and Rehabilitation

## 2021-12-16 DIAGNOSIS — M542 Cervicalgia: Secondary | ICD-10-CM

## 2021-12-17 DIAGNOSIS — I10 Essential (primary) hypertension: Secondary | ICD-10-CM | POA: Diagnosis not present

## 2021-12-17 DIAGNOSIS — J4540 Moderate persistent asthma, uncomplicated: Secondary | ICD-10-CM | POA: Diagnosis not present

## 2021-12-17 DIAGNOSIS — E785 Hyperlipidemia, unspecified: Secondary | ICD-10-CM | POA: Diagnosis not present

## 2021-12-17 DIAGNOSIS — N1831 Chronic kidney disease, stage 3a: Secondary | ICD-10-CM | POA: Diagnosis not present

## 2021-12-18 ENCOUNTER — Ambulatory Visit
Admission: RE | Admit: 2021-12-18 | Discharge: 2021-12-18 | Disposition: A | Discharge status: Home / Self Care | Payer: Medicare Other | Source: Ambulatory Visit | Attending: Physical Medicine and Rehabilitation | Admitting: Physical Medicine and Rehabilitation

## 2021-12-18 DIAGNOSIS — M4802 Spinal stenosis, cervical region: Secondary | ICD-10-CM | POA: Diagnosis not present

## 2021-12-18 DIAGNOSIS — M542 Cervicalgia: Secondary | ICD-10-CM | POA: Diagnosis not present

## 2021-12-25 DIAGNOSIS — M542 Cervicalgia: Secondary | ICD-10-CM | POA: Diagnosis not present

## 2021-12-28 NOTE — Progress Notes (Unsigned)
Cardiology Office Note:    Date:  12/30/2021   ID:  LAKELY ELMENDORF, DOB [birth year], MRN [id number]  PCP:  Shon Hale, MD  [facility] Cardiologist:  Christell Constant, MD  [facility] Electrophysiologist:  None   CC: Follow up DOE  History of Present Illness:    Donna Garner is a 76 y.o. female with a hx of Diabetes with HTN, Asthma, with history of HFpEF and LVH in [place] (Dr. [facility] [place]) who presented for evaluation 05/08/20.   2021: 2022- CT showed moderate non obstructive CAD with normal FFR.  Has worked with [facility] with improvement in BP 2023: Normal ziopatch  Patient notes that she is doing feeling well, has some back pain and is getting a back injection.   Since last visit notes that she has been getting back and neck pain  There are no interval hospital/ED visit.    No chest pain or pressure .  No SOB/DOE and no PND/Orthopnea.  No weight gain or leg swelling.  No palpitations or syncope, they have resolved since stopping Trulicity.   Past Medical History:  Diagnosis Date   Asthma    Cardiomegaly    CHF (congestive heart failure) (HCC)    Cough    Diabetes (HCC)    Dyspnea    History of swelling of feet    Hypertension    SOB (shortness of breath)    Swelling of ankle     No past surgical history on file.  Current Medications: Current Meds  Medication Sig   albuterol (VENTOLIN HFA) 108 (90 Base) MCG/ACT inhaler    aspirin EC 81 MG tablet Take 81 mg by mouth daily. Swallow whole.   atenolol (TENORMIN) 100 MG tablet TAKE 1 TABLET(100 MG) BY MOUTH DAILY   Cholecalciferol (VITAMIN D3) 50 MCG (2000 UT) TABS 1 tablet   fluticasone (FLONASE) 50 MCG/ACT nasal spray 1 spray in each nostril   furosemide (LASIX) 20 MG tablet TAKE 1 TABLET(20 MG) BY MOUTH DAILY   glimepiride (AMARYL) 2 MG tablet Take 2 mg by mouth 2 (two) times daily.   guaiFENesin (MUCINEX) 600 MG 12 hr tablet Take by mouth 2 (two) times daily  as needed for cough or to loosen phlegm.   mometasone-formoterol (DULERA) 100-5 MCG/ACT AERO Inhale 2 puffs into the lungs 2 (two) times daily.   montelukast (SINGULAIR) 10 MG tablet Take 10 mg by mouth daily.   pantoprazole (PROTONIX) 40 MG tablet Take 40 mg by mouth daily.   potassium chloride SA (KLOR-CON M) 20 MEQ tablet Take 1 tablet by mouth daily.   rosuvastatin (CRESTOR) 10 MG tablet Take 10 mg by mouth daily.   sertraline (ZOLOFT) 50 MG tablet Take 25 mg by mouth daily.   spironolactone (ALDACTONE) 50 MG tablet Take 1 tablet (50 mg total) by mouth daily.   traMADol (ULTRAM) 50 MG tablet tramadol 50 mg tablet  Take 1 tablet 3 times a day by oral route as needed for pain for 5 days.   valsartan (DIOVAN) 160 MG tablet TAKE 1 TABLET(160 MG) BY MOUTH DAILY    Allergies:   Glipizide, Hydroxychloroquine, Ibuprofen, Metformin, Amlodipine, Dulaglutide, Empagliflozin, Metformin and related, Saline nasal spray, and Zyrtec [cetirizine]   Social History   Socioeconomic History   Marital status: Legally Separated    Spouse name: Not on file   Number of children: Not on file   Years of education: Not on file   Highest education  level: Not on file  Occupational History   Not on file  Tobacco Use   Smoking status: Former   Smokeless tobacco: Never  Substance and Sexual Activity   Alcohol use: Never   Drug use: Never   Sexual activity: Not on file  Other Topics Concern   Not on file  Social History Narrative   Not on file   Social Determinants of Health   Financial Resource Strain: Not on file  Food Insecurity: Not on file  Transportation Needs: Not on file  Physical Activity: Not on file  Stress: Not on file  Social Connections: Not on file    SOCIAL: Daughter's name Is Lauris Poag who comes to every visit. They moved and now have access to a pool (2023)  Family History: The patient's brother and sister died from heart disease NOS  ROS:   Please see the history of present  illness.    All other systems reviewed and are negative.  EKGs/Labs/Other Studies Reviewed:    The following studies were reviewed today:  EKG:   04/17/20:  SR rate 82 no ST/T changes  Transthoracic Echocardiogram: Date: 06/05/20 Results: Basal Septal Hypertrophy without SAM or evidence of LVOT gradient 1. Left ventricular ejection fraction, by estimation, is 65 to 70%. Left  ventricular ejection fraction by 3D volume is 69 %. The left ventricle has  normal function. The left ventricle has no regional wall motion  abnormalities. There is severe left  ventricular hypertrophy of the basal-septal segment. Left ventricular  diastolic parameters are consistent with Grade I diastolic dysfunction  (impaired relaxation). Elevated left ventricular end-diastolic pressure.  The average left ventricular global  longitudinal strain is -17.7 %. The global longitudinal strain is normal.   2. Right ventricular systolic function is normal. The right ventricular  size is normal. There is normal pulmonary artery systolic pressure. The  estimated right ventricular systolic pressure is 29.6 mmHg.   3. The mitral valve is normal in structure. Mild mitral valve  regurgitation. No evidence of mitral stenosis.   4. The aortic valve is tricuspid. Aortic valve regurgitation is trivial.  Mild aortic valve sclerosis is present, with no evidence of aortic valve  stenosis.   5. The inferior vena cava is normal in size with greater than 50%  respiratory variability, suggesting right atrial pressure of 3 mmHg.   Cardiac CT: Date: 08/20/20 Results: IMPRESSION: 1. Coronary calcium score of 255. This was 83rd percentile for age, sex, and race matched control.   2. Normal coronary origin.  Right dominance.   3. CAD-RADS 3. Moderate stenosis in the LAD and LCX. Consider symptom-guided anti-ischemic pharmacotherapy as well as risk factor modification per guideline directed care. Additional analysis with CT FFR  will be submitted.   4. Aortic atherosclerosis noted.   PA dilation Normal FFR.  Vascular Venous Duplex: Date: 08/02/20 Results: Summary:  Bilateral:  - No evidence of deep vein thrombosis seen in the lower extremities,  bilaterally, from the common femoral through the mid posterior tibial and  peroneal veins.  - No evidence of deep venous insufficiency seen bilaterally in the lower  extremity.  - No evidence of superficial venous reflux seen in the greater saphenous  veins bilaterally.  - No evidence of superficial venous reflux seen in the short saphenous  veins bilaterally.     Right:  - Fluid seen around the posterior aspect of tibia and joint space. No  Baker's cyst seen.     Left:  - Fluid seen around  the posterior aspect of tibia and joint space. No  Baker's cyst seen.     Recent Labs: 09/29/2021: ALT 30; BUN 16; Creatinine, Ser 1.11; Hemoglobin 14.7; Magnesium 2.1; Platelets 214; Potassium 3.7; Sodium 136  Recent Lipid Panel No results found for: "CHOL", "TRIG", "HDL", "CHOLHDL", "VLDL", "LDLCALC", "LDLDIRECT"   Physical Exam:    VS:  BP 112/70   Pulse 67   Ht 5\' 7"  (1.702 m)   Wt 199 lb (90.3 kg)   SpO2 97%   BMI 31.17 kg/m     Wt Readings from Last 3 Encounters:  12/30/21 199 lb (90.3 kg)  09/30/21 197 lb (89.4 kg)  09/29/21 198 lb (89.8 kg)    Gen: No distress,    Neck: No JVD,  Ears: No 10/01/21 Sign Cardiac: No Rubs or Gallops, No murmur, RRR, +2 radial pulses Respiratory: Clear to auscultation bilaterally, normal effort, normal  respiratory rate GI: Soft, nontender, non-distended  MS: No  edema;  moves all extremities Integument: Skin feels  Neuro:  At time of evaluation, alert and oriented to person/place/time/situation  Psych: Normal affect, patient feels well  ASSESSMENT:    1. Coronary artery disease involving native coronary artery of native heart without angina pectoris   2. Chronic heart failure with preserved ejection fraction (HCC)    3. 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  Concerns regarding medicines are outlined above.  Orders Placed This Encounter  Procedures   Lipid panel   ALT   No orders of the defined types were placed in this encounter.   Patient Instructions  Medication Instructions:  Your physician recommends that you continue on your current medications as directed. Please refer to the Current Medication list given to you today.  *If you need a refill on your cardiac medications before your next appointment, please call your pharmacy*   Lab Work: Jan 2024- Fasting lipid panel and ALT (nothing to eat or drink 8-12 hours prior except water and black coffee)  If you have labs (blood work) drawn today and your tests are completely normal, you will receive your results only by: MyChart Message (if you have MyChart) OR A paper copy in the mail If you have any lab test that is abnormal or we need to change your treatment, we will call you to review the results.   Testing/Procedures: NONE   Follow-Up: At [facility], you and your health needs are our priority.  As part of our continuing mission to provide you with exceptional heart care, we have  created designated Provider Care Teams.  These Care Teams include your primary Cardiologist (physician) and Advanced Practice Providers (APPs -  Physician Assistants and Nurse Practitioners) who all work together to  provide you with the care you need, when you need it.  We recommend signing up for the patient portal called "MyChart".  Sign up information is provided on this After Visit Summary.  MyChart is used to connect with patients for Virtual Visits (Telemedicine).  Patients are able to view lab/test results, encounter notes, upcoming appointments, etc.  Non-urgent messages can be sent to your provider as well.   To learn more about what you can do with MyChart, go to [email or web address].    Your next appointment:   6 month(s)  The format for your next appointment:   In Person  Provider:   Christell Constant, MD    Important Information About Sugar         Signed, Christell Constant, MD  12/30/2021 12:14 PM    [facility]

## 2021-12-30 ENCOUNTER — Ambulatory Visit: Payer: Medicare Other | Admitting: Internal Medicine

## 2021-12-30 ENCOUNTER — Encounter: Payer: Self-pay | Admitting: Internal Medicine

## 2021-12-30 VITALS — BP 112/70 | HR 67 | Ht 67.0 in | Wt 199.0 lb

## 2021-12-30 DIAGNOSIS — I10 Essential (primary) hypertension: Secondary | ICD-10-CM | POA: Diagnosis not present

## 2021-12-30 DIAGNOSIS — I5032 Chronic diastolic (congestive) heart failure: Secondary | ICD-10-CM

## 2021-12-30 DIAGNOSIS — E119 Type 2 diabetes mellitus without complications: Secondary | ICD-10-CM

## 2021-12-30 DIAGNOSIS — I2510 Atherosclerotic heart disease of native coronary artery without angina pectoris: Secondary | ICD-10-CM

## 2021-12-30 NOTE — Patient Instructions (Signed)
Medication Instructions:  Your physician recommends that you continue on your current medications as directed. Please refer to the Current Medication list given to you today.  *If you need a refill on your cardiac medications before your next appointment, please call your pharmacy*   Lab Work: Jan 2024- Fasting lipid panel and ALT (nothing to eat or drink 8-12 hours prior except water and black coffee)  If you have labs (blood work) drawn today and your tests are completely normal, you will receive your results only by: MyChart Message (if you have MyChart) OR A paper copy in the mail If you have any lab test that is abnormal or we need to change your treatment, we will call you to review the results.   Testing/Procedures: NONE   Follow-Up: At [facility], you and your health needs are our priority.  As part of our continuing mission to provide you with exceptional heart care, we have created designated Provider Care Teams.  These Care Teams include your primary Cardiologist (physician) and Advanced Practice Providers (APPs -  Physician Assistants and Nurse Practitioners) who all work together to provide you with the care you need, when you need it.  We recommend signing up for the patient portal called "MyChart".  Sign up information is provided on this After Visit Summary.  MyChart is used to connect with patients for Virtual Visits (Telemedicine).  Patients are able to view lab/test results, encounter notes, upcoming appointments, etc.  Non-urgent messages can be sent to your provider as well.   To learn more about what you can do with MyChart, go to [email or web address].    Your next appointment:   6 month(s)  The format for your next appointment:   In Person  Provider:   Christell Constant, MD    Important Information About Sugar

## 2022-01-02 DIAGNOSIS — E1165 Type 2 diabetes mellitus with hyperglycemia: Secondary | ICD-10-CM | POA: Diagnosis not present

## 2022-01-10 ENCOUNTER — Ambulatory Visit: Payer: Medicare Other

## 2022-01-13 DIAGNOSIS — M542 Cervicalgia: Secondary | ICD-10-CM | POA: Diagnosis not present

## 2022-01-13 DIAGNOSIS — N1831 Chronic kidney disease, stage 3a: Secondary | ICD-10-CM | POA: Diagnosis not present

## 2022-01-13 DIAGNOSIS — Z79899 Other long term (current) drug therapy: Secondary | ICD-10-CM | POA: Diagnosis not present

## 2022-01-13 DIAGNOSIS — M5451 Vertebrogenic low back pain: Secondary | ICD-10-CM | POA: Diagnosis not present

## 2022-01-16 ENCOUNTER — Ambulatory Visit
Admission: RE | Admit: 2022-01-16 | Discharge: 2022-01-16 | Disposition: A | Discharge status: Home / Self Care | Payer: Medicare Other | Source: Ambulatory Visit | Attending: Family Medicine | Admitting: Family Medicine

## 2022-01-16 DIAGNOSIS — Z1231 Encounter for screening mammogram for malignant neoplasm of breast: Secondary | ICD-10-CM

## 2022-01-20 DIAGNOSIS — J0100 Acute maxillary sinusitis, unspecified: Secondary | ICD-10-CM | POA: Diagnosis not present

## 2022-01-20 DIAGNOSIS — J069 Acute upper respiratory infection, unspecified: Secondary | ICD-10-CM | POA: Diagnosis not present

## 2022-01-23 DIAGNOSIS — M5451 Vertebrogenic low back pain: Secondary | ICD-10-CM | POA: Diagnosis not present

## 2022-01-23 DIAGNOSIS — M542 Cervicalgia: Secondary | ICD-10-CM | POA: Diagnosis not present

## 2022-01-24 ENCOUNTER — Emergency Department (HOSPITAL_COMMUNITY)
Admission: EM | Admit: 2022-01-24 | Discharge: 2022-01-25 | Disposition: A | Discharge status: Home / Self Care | Payer: Medicare Other | Attending: Emergency Medicine | Admitting: Emergency Medicine

## 2022-01-24 ENCOUNTER — Encounter (HOSPITAL_COMMUNITY): Payer: Self-pay | Admitting: *Deleted

## 2022-01-24 ENCOUNTER — Other Ambulatory Visit: Payer: Self-pay

## 2022-01-24 DIAGNOSIS — J45909 Unspecified asthma, uncomplicated: Secondary | ICD-10-CM | POA: Diagnosis not present

## 2022-01-24 DIAGNOSIS — E119 Type 2 diabetes mellitus without complications: Secondary | ICD-10-CM | POA: Insufficient documentation

## 2022-01-24 DIAGNOSIS — I110 Hypertensive heart disease with heart failure: Secondary | ICD-10-CM | POA: Diagnosis not present

## 2022-01-24 DIAGNOSIS — Z87891 Personal history of nicotine dependence: Secondary | ICD-10-CM | POA: Insufficient documentation

## 2022-01-24 DIAGNOSIS — M542 Cervicalgia: Secondary | ICD-10-CM | POA: Diagnosis not present

## 2022-01-24 DIAGNOSIS — I509 Heart failure, unspecified: Secondary | ICD-10-CM | POA: Insufficient documentation

## 2022-01-24 LAB — CBC WITH DIFFERENTIAL/PLATELET
Abs Immature Granulocytes: 0.02 10*3/uL (ref 0.00–0.07)
Basophils Absolute: 0.1 10*3/uL (ref 0.0–0.1)
Basophils Relative: 1 %
Eosinophils Absolute: 0.2 10*3/uL (ref 0.0–0.5)
Eosinophils Relative: 2 %
HCT: 43.6 % (ref 36.0–46.0)
Hemoglobin: 14.1 g/dL (ref 12.0–15.0)
Immature Granulocytes: 0 %
Lymphocytes Relative: 42 %
Lymphs Abs: 3.4 10*3/uL (ref 0.7–4.0)
MCH: 29.3 pg (ref 26.0–34.0)
MCHC: 32.3 g/dL (ref 30.0–36.0)
MCV: 90.5 fL (ref 80.0–100.0)
Monocytes Absolute: 0.7 10*3/uL (ref 0.1–1.0)
Monocytes Relative: 8 %
Neutro Abs: 3.7 10*3/uL (ref 1.7–7.7)
Neutrophils Relative %: 47 %
Platelets: 268 10*3/uL (ref 150–400)
RBC: 4.82 MIL/uL (ref 3.87–5.11)
RDW: 14.6 % (ref 11.5–15.5)
WBC: 8 10*3/uL (ref 4.0–10.5)
nRBC: 0 % (ref 0.0–0.2)

## 2022-01-24 LAB — BASIC METABOLIC PANEL
Anion gap: 7 (ref 5–15)
BUN: 11 mg/dL (ref 8–23)
CO2: 23 mmol/L (ref 22–32)
Calcium: 9.9 mg/dL (ref 8.9–10.3)
Chloride: 109 mmol/L (ref 98–111)
Creatinine, Ser: 0.82 mg/dL (ref 0.44–1.00)
GFR, Estimated: >60 mL/min (ref >60)
Glucose, Bld: 115 mg/dL — ABNORMAL HIGH (ref 70–99)
Potassium: 3.7 mmol/L (ref 3.5–5.1)
Sodium: 139 mmol/L (ref 135–145)

## 2022-01-24 MED ORDER — OXYCODONE HCL 5 MG PO TABS
5.0000 mg | ORAL_TABLET | Freq: Once | ORAL | Status: AC
  Administered 2022-01-25: 5 mg via ORAL
  Filled 2022-01-24: qty 1

## 2022-01-24 MED ORDER — HYDROCODONE-ACETAMINOPHEN 5-325 MG PO TABS: 1.0000 | ORAL_TABLET | Freq: Once | ORAL | Status: DC

## 2022-01-24 NOTE — ED Provider Triage Note (Signed)
Emergency Medicine Provider Triage Evaluation Note  Donna Garner , a 76 y.o. female  was evaluated in triage.  Pt complains of generalized pain including neck, shoulders, arms, and legs.  States this started after receiving a injection in the neck that she was told missed its intended site.  Reports dizziness ongoing for several days worse today.  Denies other complaints.  Denies injury.  Review of Systems  Positive: As above Negative: As above  Physical Exam  BP 130/68   Pulse 62   Temp 98.8 F (37.1 C)   Resp 18   Ht 5\' 7"  (1.702 m)   Wt 90.3 kg   SpO2 98%   BMI 31.18 kg/m  Gen:   Awake, no distress   Resp:  Normal effort  MSK:   Moves extremities without difficulty  Other:  Symmetrical strength in bilateral upper and lower extremities.  Cervical spine without tenderness to palpation.  Bilateral shoulders without tenderness to palpation.  Medical Decision Making  Medically screening exam initiated at 6:42 PM.  Appropriate orders placed.  Donna Garner was informed that the remainder of the evaluation will be completed by another provider, this initial triage assessment does not replace that evaluation, and the importance of remaining in the ED until their evaluation is complete.     Andi Hence, PA-C 01/24/22 1843

## 2022-01-24 NOTE — ED Triage Notes (Signed)
The pt had her neck injected one month ago  surgery has been recommended but the pt does not want the surgery.  Her blood sugar has been up and down with a new diabetic med

## 2022-01-25 ENCOUNTER — Emergency Department (HOSPITAL_COMMUNITY): Payer: Medicare Other

## 2022-01-25 MED ORDER — LIDOCAINE HCL 4 % EX SOLN: Freq: Every day | CUTANEOUS | 0 refills | Status: DC | PRN

## 2022-01-25 MED ORDER — OXYCODONE HCL 5 MG PO TABS: 5.0000 mg | ORAL_TABLET | ORAL | 0 refills | Status: DC | PRN

## 2022-01-25 MED ORDER — LORAZEPAM 2 MG/ML IJ SOLN
2.0000 mg | Freq: Once | INTRAMUSCULAR | Status: AC
  Administered 2022-01-25: 2 mg via INTRAMUSCULAR
  Filled 2022-01-25: qty 1

## 2022-01-25 NOTE — ED Provider Notes (Signed)
[facility]-EMERGENCY DEPT [facility] Emergency Department Provider Note MRN:  [id number]  Arrival date & time: 01/25/22     Chief Complaint   Neck pain History of Present Illness   Donna Garner is a 76 y.o. year-old female with a history of hypertension, diabetes, CHF presenting to the ED with chief complaint of neck pain.  Patient having continued in worsening pain to the neck with radiation to the bilateral arms, hands, also with occasional radiation down bilateral legs.  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  She explains that she is 76 and does not want to have a big surgery.  She had an injection in her neck a month ago and it seems like her symptoms have worsened since then.  Denies fever.  She is endorsing weakness to the right side of her body for about a month or 2.  Review of Systems  A thorough review of systems was obtained and all systems are negative except as noted in the HPI and PMH.   Patient's Health History    Past Medical History:  Diagnosis Date   Asthma    Cardiomegaly    CHF (congestive heart failure) (HCC)    Cough    Diabetes (HCC)    Dyspnea    History of swelling of feet    Hypertension    SOB (shortness of breath)    Swelling of ankle     History reviewed. No pertinent surgical history.  Family History  Problem Relation Age of Onset   Heart disease Sister    Heart disease Brother     Social History   Socioeconomic History   Marital status: Legally Separated    Spouse name: Not on file   Number of children: Not on file   Years of education: Not on file   Highest education level: Not on file  Occupational History   Not on file  Tobacco Use   Smoking status: Former   Smokeless tobacco: Never  Substance and Sexual Activity   Alcohol use: Never   Drug use: Never   Sexual activity: Not on file  Other Topics Concern   Not on file  Social History Narrative   Not on file   Social Determinants of Health    Financial Resource Strain: Not on file  Food Insecurity: Not on file  Transportation Needs: Not on file  Physical Activity: Not on file  Stress: Not on file  Social Connections: Not on file  Intimate Partner Violence: Not on file     Physical Exam   Vitals:   01/24/22 1821 01/24/22 2154  BP: 130/68 119/88  Pulse: 62 63  Resp: 18 14  Temp: 98.8 F (37.1 C) 97.9 F (36.6 C)  SpO2: 98% 99%    CONSTITUTIONAL: Well-appearing, NAD NEURO/PSYCH:  Alert and oriented x 3, decreased strength to right leg, mild decreased strength to right arm, no facial droop, symmetric sensation. EYES:  eyes equal and reactive ENT/NECK:  no LAD, no JVD CARDIO: Regular rate, well-perfused, normal S1 and S2 PULM:  CTAB no wheezing or rhonchi GI/GU:  non-distended, non-tender MSK/SPINE:  No gross deformities, no edema SKIN:  no rash, atraumatic   *Additional and/or pertinent findings included in MDM below  Diagnostic and Interventional Summary    EKG Interpretation  Date/Time:    Ventricular Rate:    PR Interval:    QRS Duration:   QT Interval:    QTC Calculation:   R  Axis:     Text Interpretation:         Labs Reviewed  BASIC METABOLIC PANEL - Abnormal; Notable for the following components:      Result Value   Glucose, Bld 115 (*)    All other components within normal limits  CBC WITH DIFFERENTIAL/PLATELET    MR BRAIN WO CONTRAST    (Results Pending)  MR Cervical Spine Wo Contrast    (Results Pending)    Medications  oxyCODONE (Oxy IR/ROXICODONE) immediate release tablet 5 mg (has no administration in time range)  LORazepam (ATIVAN) injection 2 mg (2 mg Intramuscular Given 01/25/22 0134)     Procedures  /  Critical Care Procedures  ED Course and Medical Decision Making  Initial Impression and Ddx Continued and progressive pain in the neck with radiation to the extremities, appears that she has 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  Given her exam and the right-sided deficits and the worsening nature of pain, will obtain repeat imaging.  Past medical/surgical history that increases complexity of ED encounter: Cervical spondylosis  Interpretation of Diagnostics I personally reviewed the laboratory assessment and my interpretation is as follows: No significant blood count or electrolyte disturbance  Patient unable to tolerate MRI imaging due to claustrophobia, wanting to leave the emergency department and go home.  Patient Reassessment and Ultimate Disposition/Management     Difficult scenario in which patient has known cord involvement of cervical spondylosis, having worsening pain, has some right-sided deficits that have been present for days to weeks.  Repeat MRI imaging technically not indicated but would be helpful to determine any worsening of her cord involvement.  She is unable to tolerate it, she is wanting to leave.  She expresses understanding that her condition is serious, that it can lead to worsening neurological deficits, paralysis.  She mostly wants help with the pain control and she will follow-up with her regular doctors.  Patient management required discussion with the following services or consulting groups:  None  Complexity of Problems Addressed Acute illness or injury that poses threat of life of bodily function  Additional Data Reviewed and Analyzed Further history obtained from: Further history from spouse/family member  Additional Factors Impacting ED Encounter Risk Consideration of hospitalization  Elmer Sow. Pilar Plate, MD [facility] Emergency Medicine [facility] [email or web address]  Final Clinical Impressions(s) / ED Diagnoses     ICD-10-CM   1. Neck pain  M54.2       ED Discharge Orders          Ordered    oxyCODONE (ROXICODONE) 5 MG immediate release tablet  Every 4 hours PRN        01/25/22 0530    lidocaine  (XYLOCAINE) 4 % external solution  Daily PRN        01/25/22 0530             Discharge Instructions Discussed with and Provided to Patient:     Discharge Instructions      You were evaluated in the Emergency Department and after careful evaluation, we did not find any emergent condition requiring admission or further testing in the hospital.  We were unable to do MRI imaging today.  Recommend close follow-up with your regular doctors to discuss your symptoms.  Please return to the Emergency Department if you experience any worsening of your condition.  Thank you for allowing [place] to  be a part of your care.        Sabas Sous, MD 01/25/22 [id number]

## 2022-01-25 NOTE — Discharge Instructions (Addendum)
You were evaluated in the Emergency Department and after careful evaluation, we did not find any emergent condition requiring admission or further testing in the hospital.  We were unable to do MRI imaging today.  Recommend close follow-up with your regular doctors to discuss your symptoms.  Please return to the Emergency Department if you experience any worsening of your condition.  Thank you for allowing [place] to be a part of your care.

## 2022-01-28 DIAGNOSIS — M542 Cervicalgia: Secondary | ICD-10-CM | POA: Diagnosis not present

## 2022-01-28 DIAGNOSIS — M5451 Vertebrogenic low back pain: Secondary | ICD-10-CM | POA: Diagnosis not present

## 2022-01-28 DIAGNOSIS — E1169 Type 2 diabetes mellitus with other specified complication: Secondary | ICD-10-CM | POA: Diagnosis not present

## 2022-01-28 DIAGNOSIS — J4540 Moderate persistent asthma, uncomplicated: Secondary | ICD-10-CM | POA: Diagnosis not present

## 2022-01-28 DIAGNOSIS — E785 Hyperlipidemia, unspecified: Secondary | ICD-10-CM | POA: Diagnosis not present

## 2022-01-28 DIAGNOSIS — I10 Essential (primary) hypertension: Secondary | ICD-10-CM | POA: Diagnosis not present

## 2022-02-07 DIAGNOSIS — M5451 Vertebrogenic low back pain: Secondary | ICD-10-CM | POA: Diagnosis not present

## 2022-02-07 DIAGNOSIS — M542 Cervicalgia: Secondary | ICD-10-CM | POA: Diagnosis not present

## 2022-02-11 DIAGNOSIS — M5451 Vertebrogenic low back pain: Secondary | ICD-10-CM | POA: Diagnosis not present

## 2022-02-11 DIAGNOSIS — M542 Cervicalgia: Secondary | ICD-10-CM | POA: Diagnosis not present

## 2022-02-12 ENCOUNTER — Other Ambulatory Visit: Payer: Self-pay

## 2022-02-12 ENCOUNTER — Emergency Department (HOSPITAL_COMMUNITY)
Admission: EM | Admit: 2022-02-12 | Discharge: 2022-02-12 | Disposition: A | Discharge status: Home / Self Care | Payer: Medicare Other | Attending: Student | Admitting: Student

## 2022-02-12 ENCOUNTER — Encounter (HOSPITAL_COMMUNITY): Payer: Self-pay

## 2022-02-12 ENCOUNTER — Ambulatory Visit: Payer: Medicare Other | Admitting: Dietician

## 2022-02-12 ENCOUNTER — Emergency Department (HOSPITAL_COMMUNITY): Payer: Medicare Other

## 2022-02-12 DIAGNOSIS — R42 Dizziness and giddiness: Secondary | ICD-10-CM | POA: Insufficient documentation

## 2022-02-12 DIAGNOSIS — J209 Acute bronchitis, unspecified: Secondary | ICD-10-CM | POA: Diagnosis not present

## 2022-02-12 DIAGNOSIS — J45909 Unspecified asthma, uncomplicated: Secondary | ICD-10-CM | POA: Insufficient documentation

## 2022-02-12 DIAGNOSIS — Z79899 Other long term (current) drug therapy: Secondary | ICD-10-CM | POA: Insufficient documentation

## 2022-02-12 DIAGNOSIS — Z7982 Long term (current) use of aspirin: Secondary | ICD-10-CM | POA: Insufficient documentation

## 2022-02-12 DIAGNOSIS — R0602 Shortness of breath: Secondary | ICD-10-CM | POA: Diagnosis not present

## 2022-02-12 DIAGNOSIS — R0789 Other chest pain: Secondary | ICD-10-CM | POA: Diagnosis not present

## 2022-02-12 DIAGNOSIS — E119 Type 2 diabetes mellitus without complications: Secondary | ICD-10-CM | POA: Diagnosis not present

## 2022-02-12 DIAGNOSIS — I110 Hypertensive heart disease with heart failure: Secondary | ICD-10-CM | POA: Insufficient documentation

## 2022-02-12 DIAGNOSIS — R059 Cough, unspecified: Secondary | ICD-10-CM | POA: Diagnosis not present

## 2022-02-12 DIAGNOSIS — Z20822 Contact with and (suspected) exposure to covid-19: Secondary | ICD-10-CM | POA: Insufficient documentation

## 2022-02-12 DIAGNOSIS — Z87891 Personal history of nicotine dependence: Secondary | ICD-10-CM | POA: Insufficient documentation

## 2022-02-12 DIAGNOSIS — I509 Heart failure, unspecified: Secondary | ICD-10-CM | POA: Insufficient documentation

## 2022-02-12 LAB — COMPREHENSIVE METABOLIC PANEL
ALT: 22 U/L (ref 0–44)
AST: 23 U/L (ref 15–41)
Albumin: 4.1 g/dL (ref 3.5–5.0)
Alkaline Phosphatase: 34 U/L — ABNORMAL LOW (ref 38–126)
Anion gap: 9 (ref 5–15)
BUN: 11 mg/dL (ref 8–23)
CO2: 22 mmol/L (ref 22–32)
Calcium: 9.9 mg/dL (ref 8.9–10.3)
Chloride: 112 mmol/L — ABNORMAL HIGH (ref 98–111)
Creatinine, Ser: 0.98 mg/dL (ref 0.44–1.00)
GFR, Estimated: 60 mL/min — ABNORMAL LOW (ref >60)
Glucose, Bld: 128 mg/dL — ABNORMAL HIGH (ref 70–99)
Potassium: 3.5 mmol/L (ref 3.5–5.1)
Sodium: 143 mmol/L (ref 135–145)
Total Bilirubin: 0.5 mg/dL (ref 0.3–1.2)
Total Protein: 7 g/dL (ref 6.5–8.1)

## 2022-02-12 LAB — CBC WITH DIFFERENTIAL/PLATELET
Abs Immature Granulocytes: 0.01 10*3/uL (ref 0.00–0.07)
Basophils Absolute: 0 10*3/uL (ref 0.0–0.1)
Basophils Relative: 1 %
Eosinophils Absolute: 0.2 10*3/uL (ref 0.0–0.5)
Eosinophils Relative: 3 %
HCT: 42.7 % (ref 36.0–46.0)
Hemoglobin: 14 g/dL (ref 12.0–15.0)
Immature Granulocytes: 0 %
Lymphocytes Relative: 40 %
Lymphs Abs: 2.4 10*3/uL (ref 0.7–4.0)
MCH: 29.4 pg (ref 26.0–34.0)
MCHC: 32.8 g/dL (ref 30.0–36.0)
MCV: 89.5 fL (ref 80.0–100.0)
Monocytes Absolute: 0.6 10*3/uL (ref 0.1–1.0)
Monocytes Relative: 9 %
Neutro Abs: 2.8 10*3/uL (ref 1.7–7.7)
Neutrophils Relative %: 47 %
Platelets: 215 10*3/uL (ref 150–400)
RBC: 4.77 MIL/uL (ref 3.87–5.11)
RDW: 14.7 % (ref 11.5–15.5)
WBC: 6.1 10*3/uL (ref 4.0–10.5)
nRBC: 0 % (ref 0.0–0.2)

## 2022-02-12 LAB — RESP PANEL BY RT-PCR (FLU A&B, COVID) ARPGX2
Influenza A by PCR: NEGATIVE
Influenza B by PCR: NEGATIVE
SARS Coronavirus 2 by RT PCR: NEGATIVE

## 2022-02-12 LAB — BRAIN NATRIURETIC PEPTIDE: B Natriuretic Peptide: 20.4 pg/mL (ref 0.0–100.0)

## 2022-02-12 LAB — TROPONIN I (HIGH SENSITIVITY): Troponin I (High Sensitivity): <2 ng/L (ref <18)

## 2022-02-12 MED ORDER — ALBUTEROL SULFATE (2.5 MG/3ML) 0.083% IN NEBU: 2.5000 mg | INHALATION_SOLUTION | Freq: Four times a day (QID) | RESPIRATORY_TRACT | 12 refills | Status: AC | PRN

## 2022-02-12 MED ORDER — GUAIFENESIN 100 MG/5ML PO LIQD
10.0000 mL | Freq: Once | ORAL | Status: AC
  Administered 2022-02-12: 10 mL via ORAL
  Filled 2022-02-12: qty 10

## 2022-02-12 MED ORDER — IPRATROPIUM-ALBUTEROL 0.5-2.5 (3) MG/3ML IN SOLN
3.0000 mL | Freq: Once | RESPIRATORY_TRACT | Status: AC
  Administered 2022-02-12: 3 mL via RESPIRATORY_TRACT
  Filled 2022-02-12: qty 3

## 2022-02-12 MED ORDER — GUAIFENESIN ER 600 MG PO TB12: 600.0000 mg | ORAL_TABLET | Freq: Two times a day (BID) | ORAL | 0 refills | Status: AC

## 2022-02-12 NOTE — ED Triage Notes (Addendum)
Pt reports with SHOB, dizziness, and congestion along with spitting up clear thick mucus since today. Pt states that she had to use her albuterol pump twice today. Pt states that she thinks that her throat hurts from throwing up mucus.

## 2022-02-12 NOTE — ED Provider Notes (Signed)
[facility]-EMERGENCY DEPT Provider Note  CSN: [id number] Arrival date & time: 02/12/22 1923  Chief Complaint(s) Shortness of Breath and Dizziness  HPI Donna Garner is a 76 y.o. female with PMH asthma, CHF, T2DM who presents emergency department for evaluation of shortness of breath and dizziness.  Patient states that over the course of today she has had a significant increase in the amount of sputum that she has been coughing up.  She states that she will have fits where she feels like she cannot breathe and then she will cough up a large amount of mucus which will resolve her symptoms.  She states that she was previously on Mucinex but this was discontinued because the medication may raise her blood pressure.  Suspect this was the combination medicine with phenylephrine.  She denies chest pain, abdominal pain, nausea, vomiting or other systemic symptoms.   Past Medical History Past Medical History:  Diagnosis Date   Asthma    Cardiomegaly    CHF (congestive heart failure) (HCC)    Cough    Diabetes (HCC)    Dyspnea    History of swelling of feet    Hypertension    SOB (shortness of breath)    Swelling of ankle    Patient Active Problem List   Diagnosis Date Noted   Coronary artery disease involving native coronary artery of native heart without angina pectoris 12/30/2021   Chronic heart failure with preserved ejection fraction (HCC) 05/08/2020   Diabetes mellitus with coincident hypertension (HCC) 05/08/2020   Home Medication(s) Prior to Admission medications   Medication Sig Start Date End Date Taking? Authorizing Provider  albuterol (VENTOLIN HFA) 108 (90 Base) MCG/ACT inhaler  04/05/21   [provider]  aspirin EC 81 MG tablet Take 81 mg by mouth daily. Swallow whole.    [provider]  atenolol (TENORMIN) 100 MG tablet TAKE 1 TABLET(100 MG) BY MOUTH DAILY 07/24/21   Chandrasekhar, Mahesh A, MD  Cholecalciferol (VITAMIN D3) 50 MCG  (2000 UT) TABS 1 tablet    [provider]  fluticasone (FLONASE) 50 MCG/ACT nasal spray 1 spray in each nostril 11/07/20   [provider]  furosemide (LASIX) 20 MG tablet TAKE 1 TABLET(20 MG) BY MOUTH DAILY 07/02/21   Chandrasekhar, Mahesh A, MD  glimepiride (AMARYL) 2 MG tablet Take 2 mg by mouth 2 (two) times daily. 08/01/20   [provider]  guaiFENesin (MUCINEX) 600 MG 12 hr tablet Take by mouth 2 (two) times daily as needed for cough or to loosen phlegm.    [provider]  lidocaine (XYLOCAINE) 4 % external solution Apply topically daily as needed. 01/25/22   Sabas Sous, MD  mometasone-formoterol (DULERA) 100-5 MCG/ACT AERO Inhale 2 puffs into the lungs 2 (two) times daily.    [provider]  montelukast (SINGULAIR) 10 MG tablet Take 10 mg by mouth daily. 04/12/20   [provider]  oxyCODONE (ROXICODONE) 5 MG immediate release tablet Take 1 tablet (5 mg total) by mouth every 4 (four) hours as needed for severe pain. 01/25/22   Sabas Sous, MD  pantoprazole (PROTONIX) 40 MG tablet Take 40 mg by mouth daily.    [provider]  potassium chloride SA (KLOR-CON M) 20 MEQ tablet Take 1 tablet by mouth daily.    [provider]  rosuvastatin (CRESTOR) 10 MG tablet Take 10 mg by mouth daily.    [provider]  sertraline (ZOLOFT) 50 MG tablet Take 25  mg by mouth daily. 12/04/21   [provider]  spironolactone (ALDACTONE) 50 MG tablet Take 1 tablet (50 mg total) by mouth daily. 07/04/21   Chandrasekhar, Rondel Jumbo, MD  traMADol (ULTRAM) 50 MG tablet tramadol 50 mg tablet  Take 1 tablet 3 times a day by oral route as needed for pain for 5 days.    [provider]  valsartan (DIOVAN) 160 MG tablet TAKE 1 TABLET(160 MG) BY MOUTH DAILY 10/18/21   Christell Constant, MD                                                                                                                                     Past Surgical History History reviewed. No pertinent surgical history. Family History Family History  Problem Relation Age of Onset   Heart disease Sister    Heart disease Brother     Social History Social History   Tobacco Use   Smoking status: Former   Smokeless tobacco: Never  Substance Use Topics   Alcohol use: Never   Drug use: Never   Allergies Glipizide, Hydroxychloroquine, Ibuprofen, Metformin, Amlodipine, Dulaglutide, Empagliflozin, Metformin and related, Saline nasal spray, and Zyrtec [cetirizine]  Review of Systems Review of Systems  Respiratory:  Positive for cough and shortness of breath.     Physical Exam Vital Signs  I have reviewed the triage vital signs BP 132/85   Pulse 64   Temp 98.4 F (36.9 C) (Oral)   Resp 15   Ht 5\' 7"  (1.702 m)   Wt 90.3 kg   SpO2 96%   BMI 31.17 kg/m   Physical Exam Vitals and nursing note reviewed.  Constitutional:      General: She is not in acute distress.    Appearance: She is well-developed.  HENT:     Head: Normocephalic and atraumatic.  Eyes:     Conjunctiva/sclera: Conjunctivae normal.  Cardiovascular:     Rate and Rhythm: Normal rate and regular rhythm.     Heart sounds: No murmur heard. Pulmonary:     Effort: Pulmonary effort is normal. No respiratory distress.     Breath sounds: Wheezing present.  Abdominal:     Palpations: Abdomen is soft.     Tenderness: There is no abdominal tenderness.  Musculoskeletal:        General: No swelling.     Cervical back: Neck supple.  Skin:    General: Skin is warm and dry.     Capillary Refill: Capillary refill takes less than 2 seconds.  Neurological:     Mental Status: She is alert.  Psychiatric:        Mood and Affect: Mood normal.     ED Results and Treatments Labs (all labs ordered are listed, but only abnormal results are displayed) Labs Reviewed  COMPREHENSIVE METABOLIC PANEL - Abnormal; Notable for the following components:      Result Value  Chloride 112 (*)    Glucose, Bld 128 (*)    Alkaline Phosphatase 34 (*)    GFR, Estimated 60 (*)    All other components within normal limits  RESP PANEL BY RT-PCR (FLU A&B, COVID) ARPGX2  CBC WITH DIFFERENTIAL/PLATELET  BRAIN NATRIURETIC PEPTIDE  TROPONIN I (HIGH SENSITIVITY)                                                                                                                          Radiology DG Chest 2 View  Result Date: 02/12/2022 CLINICAL DATA:  Shortness of breath.  Productive cough. EXAM: CHEST - 2 VIEW COMPARISON:  10/15/2021 FINDINGS: The cardiomediastinal contours are normal. Mild peribronchial thickening. Pulmonary vasculature is normal. No consolidation, pleural effusion, or pneumothorax. No acute osseous abnormalities are seen. Probable ossified intra-articular bodies in both shoulder joints. IMPRESSION: Mild peribronchial thickening, can be seen with bronchitis or asthma. Electronically Signed   By: Narda Rutherford M.D.   On: 02/12/2022 20:29    Pertinent labs & imaging results that were available during my care of the patient were reviewed by me and considered in my medical decision making (see MDM for details).  Medications Ordered in ED Medications  guaiFENesin (ROBITUSSIN) 100 MG/5ML liquid 10 mL (10 mLs Oral Given 02/12/22 2146)  ipratropium-albuterol (DUONEB) 0.5-2.5 (3) MG/3ML nebulizer solution 3 mL (3 mLs Nebulization Given 02/12/22 2146)                                                                                                                                     Procedures Procedures  (including critical care time)  Medical Decision Making / ED Course   This patient presents to the ED for concern of cough, this involves an extensive number of treatment options, and is a complaint that carries with it a high risk of complications and morbidity.  The differential diagnosis includes bronchitis, chronic bronchitis, mucous plugging, reactive airway  disease, COPD/asthma exacerbation  MDM: Patient seen emergency room for evaluation of multiple complaints as described above.  Physical exam with very faint wheezing but is otherwise unremarkable.  Laboratory evaluation unremarkable.  Chest x-ray with no pneumonia and is more suggestive of bronchitis.  Patient received Mucinex and DuoNeb treatment and her symptoms significantly improved.  Patient requesting refill on her home albuterol nebulizer treatments which have provided and she will be discharged with a prescription for  Mucinex.  Patient then discharged.   Additional history obtained: -Additional history obtained from daughter2 -External records from outside source obtained and reviewed including: Chart review including previous notes, labs, imaging, consultation notes   Lab Tests: -I ordered, reviewed, and interpreted labs.   The pertinent results include:   Labs Reviewed  COMPREHENSIVE METABOLIC PANEL - Abnormal; Notable for the following components:      Result Value   Chloride 112 (*)    Glucose, Bld 128 (*)    Alkaline Phosphatase 34 (*)    GFR, Estimated 60 (*)    All other components within normal limits  RESP PANEL BY RT-PCR (FLU A&B, COVID) ARPGX2  CBC WITH DIFFERENTIAL/PLATELET  BRAIN NATRIURETIC PEPTIDE  TROPONIN I (HIGH SENSITIVITY)      EKG   EKG Interpretation  Date/Time:  Wednesday February 12 2022 20:09:14 EDT Ventricular Rate:  59 PR Interval:  162 QRS Duration: 101 QT Interval:  407 QTC Calculation: 404 R Axis:   -48 Text Interpretation: Sinus rhythm RSR' in V1 or V2, right VCD or RVH Confirmed by Abram Sax ([id number]) on 02/12/2022 9:15:09 PM         Imaging Studies ordered: I ordered imaging studies including CXR I independently visualized and interpreted imaging. I agree with the radiologist interpretation   Medicines ordered and prescription drug management: Meds ordered this encounter  Medications   guaiFENesin (ROBITUSSIN) 100 MG/5ML  liquid 10 mL   ipratropium-albuterol (DUONEB) 0.5-2.5 (3) MG/3ML nebulizer solution 3 mL    -I have reviewed the patients home medicines and have made adjustments as needed  Critical interventions none   Cardiac Monitoring: The patient was maintained on a cardiac monitor.  I personally viewed and interpreted the cardiac monitored which showed an underlying rhythm of: NSR  Social Determinants of Health:  Factors impacting patients care include: none   Reevaluation: After the interventions noted above, I reevaluated the patient and found that they have :improved  Co morbidities that complicate the patient evaluation  Past Medical History:  Diagnosis Date   Asthma    Cardiomegaly    CHF (congestive heart failure) (HCC)    Cough    Diabetes (HCC)    Dyspnea    History of swelling of feet    Hypertension    SOB (shortness of breath)    Swelling of ankle       Dispostion: I considered admission for this patient, but she does not meet inpatient criteria for admission she is safe for discharge with outpatient follow-up     Final Clinical Impression(s) / ED Diagnoses Final diagnoses:  None     @PCDICTATION @    Natalina Wieting, , MD 02/13/22 1134

## 2022-02-12 NOTE — ED Provider Triage Note (Signed)
Emergency Medicine Provider Triage Evaluation Note  Donna Garner , a 76 y.o. female  was evaluated in triage.  Pt complains of cough, chest tightness and shortness of breath.  History of CHF, compliant with meds.  No increase in lower extremity swelling.  Is a some chest tightness.  Cough productive of thick mucus.  She has attempted albuterol twice today.  No known sick contacts.  Review of Systems  Positive: Chest tightness, shortness of breath, cough Negative: Fever  Physical Exam  BP (!) 151/89 (BP Location: Right Arm)   Pulse 63   Temp 98.4 F (36.9 C) (Oral)   Resp 16   Ht 5\' 7"  (1.702 m)   Wt 90.3 kg   SpO2 100%   BMI 31.17 kg/m  Gen:   Awake, no distress   Resp:  Normal effort  MSK:   Moves extremities without difficulty  Other:    Medical Decision Making  Medically screening exam initiated at 8:04 PM.  Appropriate orders placed.  EILEY MCGINNITY was informed that the remainder of the evaluation will be completed by another provider, this initial triage assessment does not replace that evaluation, and the importance of remaining in the ED until their evaluation is complete.  SOB, cough, chest tightness   Merdith Adan A, PA-C 02/12/22 2005

## 2022-02-13 DIAGNOSIS — M5416 Radiculopathy, lumbar region: Secondary | ICD-10-CM | POA: Diagnosis not present

## 2022-02-13 DIAGNOSIS — M48062 Spinal stenosis, lumbar region with neurogenic claudication: Secondary | ICD-10-CM | POA: Diagnosis not present

## 2022-02-13 DIAGNOSIS — M542 Cervicalgia: Secondary | ICD-10-CM | POA: Diagnosis not present

## 2022-02-14 DIAGNOSIS — M5451 Vertebrogenic low back pain: Secondary | ICD-10-CM | POA: Diagnosis not present

## 2022-02-14 DIAGNOSIS — M542 Cervicalgia: Secondary | ICD-10-CM | POA: Diagnosis not present

## 2022-02-17 DIAGNOSIS — M5451 Vertebrogenic low back pain: Secondary | ICD-10-CM | POA: Diagnosis not present

## 2022-02-17 DIAGNOSIS — M542 Cervicalgia: Secondary | ICD-10-CM | POA: Diagnosis not present

## 2022-02-18 DIAGNOSIS — M5416 Radiculopathy, lumbar region: Secondary | ICD-10-CM | POA: Diagnosis not present

## 2022-02-19 DIAGNOSIS — E1169 Type 2 diabetes mellitus with other specified complication: Secondary | ICD-10-CM | POA: Diagnosis not present

## 2022-02-19 DIAGNOSIS — E785 Hyperlipidemia, unspecified: Secondary | ICD-10-CM | POA: Diagnosis not present

## 2022-02-19 DIAGNOSIS — J4540 Moderate persistent asthma, uncomplicated: Secondary | ICD-10-CM | POA: Diagnosis not present

## 2022-02-19 DIAGNOSIS — I10 Essential (primary) hypertension: Secondary | ICD-10-CM | POA: Diagnosis not present

## 2022-02-21 DIAGNOSIS — M5451 Vertebrogenic low back pain: Secondary | ICD-10-CM | POA: Diagnosis not present

## 2022-02-21 DIAGNOSIS — M542 Cervicalgia: Secondary | ICD-10-CM | POA: Diagnosis not present

## 2022-02-24 DIAGNOSIS — M5451 Vertebrogenic low back pain: Secondary | ICD-10-CM | POA: Diagnosis not present

## 2022-02-24 DIAGNOSIS — M542 Cervicalgia: Secondary | ICD-10-CM | POA: Diagnosis not present

## 2022-02-26 DIAGNOSIS — M542 Cervicalgia: Secondary | ICD-10-CM | POA: Diagnosis not present

## 2022-02-26 DIAGNOSIS — M5451 Vertebrogenic low back pain: Secondary | ICD-10-CM | POA: Diagnosis not present

## 2022-03-03 ENCOUNTER — Encounter: Payer: Self-pay | Admitting: *Deleted

## 2022-03-03 ENCOUNTER — Telehealth: Payer: Self-pay | Admitting: *Deleted

## 2022-03-03 DIAGNOSIS — M5451 Vertebrogenic low back pain: Secondary | ICD-10-CM | POA: Diagnosis not present

## 2022-03-03 DIAGNOSIS — I509 Heart failure, unspecified: Secondary | ICD-10-CM

## 2022-03-03 DIAGNOSIS — M542 Cervicalgia: Secondary | ICD-10-CM | POA: Diagnosis not present

## 2022-03-03 NOTE — Patient Instructions (Signed)
Visit Information  Thank you for taking time to visit with me today. Please don't hesitate to contact me if I can be of assistance to you.   Following are the goals we discussed today:   Goals Addressed               This Visit's Progress     COMPLETED: Transportation resource (pt-stated)        Care Coordination Interventions: Advised patient to contact her providers to schedule her AWV for 2023 if she has not had this visit. Provided education to patient and/or caregiver about advanced directives Reviewed medications with patient and discussed adherence to all her medications and educated accordingly Reviewed scheduled/upcoming provider appointments including pending appointments Care Guide referral for transportation resource for a SCATs application. Screening for signs and symptoms of depression related to chronic disease state  Assessed social determinant of health barriers         Please call the care guide team at [phone] if you need to cancel or reschedule your appointment.   If you are experiencing a Mental Health or [place] or need someone to talk to, please call the Suicide and Crisis Lifeline: 988  Patient verbalizes understanding of instructions and care plan provided today and agrees to view in [place]. Active MyChart status and patient understanding of how to access instructions and care plan via MyChart confirmed with patient.     No further follow up required: No further needs  Raina Mina, RN Care Management Coordinator [place] Office [phone]

## 2022-03-03 NOTE — Patient Outreach (Signed)
  Care Coordination   Initial Visit Note   03/03/2022 Name: VAYLA WILHELMI MRN: [id number] DOB: [birth year]  DONNA SNOOKS is a 76 y.o. year old female who sees Lindell Noe, Anastasia Pall, MD for primary care. I spoke with  Oren Section by phone today.  What matters to the patients health and wellness today?   No needs    Goals Addressed               This Visit's Progress     COMPLETED: Transportation resource (pt-stated)        Care Coordination Interventions: Advised patient to contact her providers to schedule her AWV for 2023 if she has not had this visit. Provided education to patient and/or caregiver about advanced directives Reviewed medications with patient and discussed adherence to all her medications and educated accordingly Reviewed scheduled/upcoming provider appointments including pending appointments Care Guide referral for transportation resource for a SCATs application. Screening for signs and symptoms of depression related to chronic disease state  Assessed social determinant of health barriers         SDOH assessments and interventions completed:  Yes  SDOH Interventions Today    Flowsheet Row Most Recent Value  SDOH Interventions   Food Insecurity Interventions Intervention Not Indicated  Housing Interventions Intervention Not Indicated  Transportation Interventions Intervention Not Indicated        Care Coordination Interventions Activated:  Yes  Care Coordination Interventions:  Yes, provided   Follow up plan: No further intervention required.   Encounter Outcome:  Pt. Visit Completed   Raina Mina, RN Care Management Coordinator [place] Office [phone]

## 2022-03-04 ENCOUNTER — Telehealth: Payer: Self-pay

## 2022-03-04 NOTE — Telephone Encounter (Signed)
   Telephone encounter was:  Unsuccessful.  03/04/2022 Name: Donna Garner MRN: [id number] DOB: 09-Mar-1946  Unsuccessful outbound call made today to assist with:  Transportation Needs   Outreach Attempt:  1st Attempt  A HIPAA compliant voice message was left requesting a return call.  Instructed patient to call back at [phone].  [place] Resource Care Guide   [email or web address]  ?? [phone]   Website: [email or web address]  [email or web address]  "We don't say no, we SHOW how!"         [facility]

## 2022-03-05 ENCOUNTER — Telehealth: Payer: Self-pay

## 2022-03-05 NOTE — Telephone Encounter (Signed)
   Telephone encounter was:  Successful.  03/05/2022 Name: Donna Garner MRN: [id number] DOB: [birth year]  Donna Garner is a 76 y.o. year old female who is a primary care patient of Lindell Noe, Anastasia Pall, MD . The community resource team was consulted for assistance with Transportation Needs   Care guide performed the following interventions: Spoke with patient's daughter [name], she stated that she provides transportation for the patient to her appointments and would not need SCAT at this time.  Follow Up Plan:  No further follow up planned at this time. The patient has been provided with needed resources.  [place] Resource Care Guide   [email or web address]  ?? [phone]   Website: [email or web address]  [email or web address]  "We don't say no, we SHOW how!"         [facility]

## 2022-03-07 ENCOUNTER — Encounter: Payer: Self-pay | Admitting: Internal Medicine

## 2022-03-07 NOTE — Telephone Encounter (Signed)
error 

## 2022-03-08 ENCOUNTER — Other Ambulatory Visit: Payer: Self-pay

## 2022-03-08 ENCOUNTER — Emergency Department (HOSPITAL_COMMUNITY): Payer: Medicare Other

## 2022-03-08 ENCOUNTER — Emergency Department (HOSPITAL_COMMUNITY)
Admission: EM | Admit: 2022-03-08 | Discharge: 2022-03-08 | Disposition: A | Discharge status: Home / Self Care | Payer: Medicare Other | Attending: Emergency Medicine | Admitting: Emergency Medicine

## 2022-03-08 ENCOUNTER — Encounter (HOSPITAL_COMMUNITY): Payer: Self-pay

## 2022-03-08 DIAGNOSIS — J029 Acute pharyngitis, unspecified: Secondary | ICD-10-CM | POA: Diagnosis not present

## 2022-03-08 DIAGNOSIS — E119 Type 2 diabetes mellitus without complications: Secondary | ICD-10-CM | POA: Insufficient documentation

## 2022-03-08 DIAGNOSIS — Z7984 Long term (current) use of oral hypoglycemic drugs: Secondary | ICD-10-CM | POA: Diagnosis not present

## 2022-03-08 DIAGNOSIS — J45909 Unspecified asthma, uncomplicated: Secondary | ICD-10-CM | POA: Diagnosis not present

## 2022-03-08 DIAGNOSIS — I110 Hypertensive heart disease with heart failure: Secondary | ICD-10-CM | POA: Diagnosis not present

## 2022-03-08 DIAGNOSIS — Z79899 Other long term (current) drug therapy: Secondary | ICD-10-CM | POA: Diagnosis not present

## 2022-03-08 DIAGNOSIS — I509 Heart failure, unspecified: Secondary | ICD-10-CM | POA: Insufficient documentation

## 2022-03-08 DIAGNOSIS — Z20822 Contact with and (suspected) exposure to covid-19: Secondary | ICD-10-CM | POA: Insufficient documentation

## 2022-03-08 DIAGNOSIS — Z7982 Long term (current) use of aspirin: Secondary | ICD-10-CM | POA: Diagnosis not present

## 2022-03-08 DIAGNOSIS — R0602 Shortness of breath: Secondary | ICD-10-CM | POA: Insufficient documentation

## 2022-03-08 DIAGNOSIS — R079 Chest pain, unspecified: Secondary | ICD-10-CM | POA: Diagnosis not present

## 2022-03-08 DIAGNOSIS — R110 Nausea: Secondary | ICD-10-CM | POA: Insufficient documentation

## 2022-03-08 LAB — CBC
HCT: 47.1 % — ABNORMAL HIGH (ref 36.0–46.0)
Hemoglobin: 15.6 g/dL — ABNORMAL HIGH (ref 12.0–15.0)
MCH: 28.9 pg (ref 26.0–34.0)
MCHC: 33.1 g/dL (ref 30.0–36.0)
MCV: 87.2 fL (ref 80.0–100.0)
Platelets: 231 10*3/uL (ref 150–400)
RBC: 5.4 MIL/uL — ABNORMAL HIGH (ref 3.87–5.11)
RDW: 15 % (ref 11.5–15.5)
WBC: 6.8 10*3/uL (ref 4.0–10.5)
nRBC: 0 % (ref 0.0–0.2)

## 2022-03-08 LAB — TROPONIN I (HIGH SENSITIVITY)
Troponin I (High Sensitivity): <2 ng/L (ref <18)
Troponin I (High Sensitivity): <2 ng/L (ref <18)

## 2022-03-08 LAB — BASIC METABOLIC PANEL
Anion gap: 10 (ref 5–15)
BUN: 16 mg/dL (ref 8–23)
CO2: 22 mmol/L (ref 22–32)
Calcium: 10.1 mg/dL (ref 8.9–10.3)
Chloride: 104 mmol/L (ref 98–111)
Creatinine, Ser: 0.98 mg/dL (ref 0.44–1.00)
GFR, Estimated: 60 mL/min — ABNORMAL LOW (ref >60)
Glucose, Bld: 140 mg/dL — ABNORMAL HIGH (ref 70–99)
Potassium: 4.3 mmol/L (ref 3.5–5.1)
Sodium: 136 mmol/L (ref 135–145)

## 2022-03-08 LAB — RESP PANEL BY RT-PCR (FLU A&B, COVID) ARPGX2
Influenza A by PCR: NEGATIVE
Influenza B by PCR: NEGATIVE
SARS Coronavirus 2 by RT PCR: NEGATIVE

## 2022-03-08 LAB — BRAIN NATRIURETIC PEPTIDE: B Natriuretic Peptide: 15.7 pg/mL (ref 0.0–100.0)

## 2022-03-08 LAB — D-DIMER, QUANTITATIVE: D-Dimer, Quant: 0.48 ug/mL-FEU (ref 0.00–0.50)

## 2022-03-08 MED ORDER — GUAIFENESIN 100 MG/5ML PO LIQD
10.0000 mL | Freq: Once | ORAL | Status: AC
  Administered 2022-03-08: 10 mL via ORAL
  Filled 2022-03-08: qty 10

## 2022-03-08 MED ORDER — IPRATROPIUM-ALBUTEROL 0.5-2.5 (3) MG/3ML IN SOLN
3.0000 mL | Freq: Once | RESPIRATORY_TRACT | Status: AC
  Administered 2022-03-08: 3 mL via RESPIRATORY_TRACT
  Filled 2022-03-08: qty 3

## 2022-03-08 NOTE — ED Provider Notes (Signed)
[facility] Note   CSN: [id number] Arrival date & time: 03/08/22  1132     History  Chief Complaint  Patient presents with   Shortness of Breath   Chest Pain   Sore Throat    Donna Garner is a 76 y.o. female with medical history of asthma, cardiomegaly, CHF, diabetes, hypertension, shortness of breath.  Patient presents to the ED for evaluation of shortness of breath and chest pain.  Patient reports that ever since she was discharged from this facility on 02/12/2022 she has had intermittent shortness of breath.  The patient states that she was seen originally on 9/6 for shortness of breath, discharged home with unremarkable work-up.  Patient states ever since this time her shortness of breath has been intermittent.  Patient reports that yesterday and into today she has had gradual chest tightness which is nonradiating, not worsening with exertion.  The patient states that she has been utilizing her at home duo nebulizer treatments with slight relief of symptoms however symptoms always return.  Patient denies any fevers however does endorse nausea without vomiting.  The patient endorses a sore throat as well as cervical adenopathy.  The patient denies any diarrhea, dysuria, leg swelling beyond baseline.  Patient denies any history of blood clots, being anticoagulated, recent travel or surgery, exogenous hormone use.   Shortness of Breath Associated symptoms: chest pain and sore throat   Associated symptoms: no fever and no vomiting   Chest Pain Associated symptoms: nausea and shortness of breath   Associated symptoms: no fever and no vomiting   Sore Throat Associated symptoms include chest pain and shortness of breath.       Home Medications Prior to Admission medications   Medication Sig Start Date End Date Taking? Authorizing Provider  albuterol (PROVENTIL) (2.5 MG/3ML) 0.083% nebulizer solution Take 3 mLs (2.5 mg total) by nebulization  every 6 (six) hours as needed for wheezing or shortness of breath. 02/12/22   Kommor, Madison, MD  albuterol (VENTOLIN HFA) 108 (90 Base) MCG/ACT inhaler  04/05/21   [provider]  aspirin EC 81 MG tablet Take 81 mg by mouth daily. Swallow whole.    [provider]  atenolol (TENORMIN) 100 MG tablet TAKE 1 TABLET(100 MG) BY MOUTH DAILY 07/24/21   Chandrasekhar, Mahesh A, MD  Cholecalciferol (VITAMIN D3) 50 MCG (2000 UT) TABS 1 tablet    [provider]  fluticasone (FLONASE) 50 MCG/ACT nasal spray 1 spray in each nostril 11/07/20   [provider]  furosemide (LASIX) 20 MG tablet TAKE 1 TABLET(20 MG) BY MOUTH DAILY 07/02/21   Chandrasekhar, Mahesh A, MD  glimepiride (AMARYL) 2 MG tablet Take 2 mg by mouth 2 (two) times daily. 08/01/20   [provider]  guaiFENesin (MUCINEX) 600 MG 12 hr tablet Take by mouth 2 (two) times daily as needed for cough or to loosen phlegm.    [provider]  guaiFENesin (MUCINEX) 600 MG 12 hr tablet Take 1 tablet (600 mg total) by mouth 2 (two) times daily. 02/12/22 03/14/22  Kommor, Madison, MD  lidocaine (XYLOCAINE) 4 % external solution Apply topically daily as needed. 01/25/22   Sabas Sous, MD  mometasone-formoterol (DULERA) 100-5 MCG/ACT AERO Inhale 2 puffs into the lungs 2 (two) times daily.    [provider]  montelukast (SINGULAIR) 10 MG tablet Take 10 mg by mouth daily. 04/12/20   [provider]  oxyCODONE (ROXICODONE) 5 MG immediate release tablet Take 1  tablet (5 mg total) by mouth every 4 (four) hours as needed for severe pain. 01/25/22   Sabas Sous, MD  pantoprazole (PROTONIX) 40 MG tablet Take 40 mg by mouth daily.    [provider]  potassium chloride SA (KLOR-CON M) 20 MEQ tablet Take 1 tablet by mouth daily.    [provider]  rosuvastatin (CRESTOR) 10 MG tablet Take 10 mg by mouth daily.    [provider]  sertraline (ZOLOFT) 50 MG tablet Take 25 mg  by mouth daily. 12/04/21   [provider]  spironolactone (ALDACTONE) 50 MG tablet Take 1 tablet (50 mg total) by mouth daily. 07/04/21   Chandrasekhar, Rondel Jumbo, MD  traMADol (ULTRAM) 50 MG tablet tramadol 50 mg tablet  Take 1 tablet 3 times a day by oral route as needed for pain for 5 days.    [provider]  valsartan (DIOVAN) 160 MG tablet TAKE 1 TABLET(160 MG) BY MOUTH DAILY 10/18/21   Chandrasekhar, Mahesh A, MD      Allergies    Glipizide, Hydroxychloroquine, Ibuprofen, Metformin, Amlodipine, Dulaglutide, Empagliflozin, Metformin and related, Saline nasal spray, and Zyrtec [cetirizine]    Review of Systems   Review of Systems  Constitutional:  Positive for chills. Negative for fever.  HENT:  Positive for sore throat.   Respiratory:  Positive for shortness of breath.   Cardiovascular:  Positive for chest pain.  Gastrointestinal:  Positive for nausea. Negative for diarrhea and vomiting.  Hematological:  Positive for adenopathy.  All other systems reviewed and are negative.   Physical Exam Updated Vital Signs BP 109/75   Pulse 64   Temp 97.7 F (36.5 C) (Oral)   Resp 18   Ht 5\' 7"  (1.702 m)   Wt 90.3 kg   SpO2 100%   BMI 31.17 kg/m  Physical Exam Vitals and nursing note reviewed.  Constitutional:      General: She is not in acute distress.    Appearance: Normal appearance. She is not ill-appearing, toxic-appearing or diaphoretic.  HENT:     Head: Normocephalic and atraumatic.     Nose: Nose normal. No congestion.     Mouth/Throat:     Mouth: Mucous membranes are moist.     Pharynx: Oropharynx is clear. Posterior oropharyngeal erythema present.  Eyes:     Extraocular Movements: Extraocular movements intact.     Conjunctiva/sclera: Conjunctivae normal.     Pupils: Pupils are equal, round, and reactive to light.  Cardiovascular:     Rate and Rhythm: Normal rate and regular rhythm.  Pulmonary:     Effort: Pulmonary effort is normal.     Breath  sounds: Normal breath sounds. No wheezing.  Abdominal:     General: Abdomen is flat. Bowel sounds are normal.     Palpations: Abdomen is soft.     Tenderness: There is no abdominal tenderness.  Musculoskeletal:     Cervical back: Normal range of motion and neck supple. No tenderness.  Skin:    General: Skin is warm and dry.     Capillary Refill: Capillary refill takes less than 2 seconds.  Neurological:     Mental Status: She is alert and oriented to person, place, and time.     GCS: GCS eye subscore is 4. GCS verbal subscore is 5. GCS motor subscore is 6.     Cranial Nerves: Cranial nerves 2-12 are intact. No cranial nerve deficit.     Sensory: Sensation is intact. No sensory deficit.  Motor: Motor function is intact. No weakness.     Coordination: Coordination is intact. Heel to Shin Test normal.     ED Results / Procedures / Treatments   Labs (all labs ordered are listed, but only abnormal results are displayed) Labs Reviewed  BASIC METABOLIC PANEL - Abnormal; Notable for the following components:      Result Value   Glucose, Bld 140 (*)    GFR, Estimated 60 (*)    All other components within normal limits  CBC - Abnormal; Notable for the following components:   RBC 5.40 (*)    Hemoglobin 15.6 (*)    HCT 47.1 (*)    All other components within normal limits  RESP PANEL BY RT-PCR (FLU A&B, COVID) ARPGX2  D-DIMER, QUANTITATIVE  TROPONIN I (HIGH SENSITIVITY)  TROPONIN I (HIGH SENSITIVITY)    EKG EKG Interpretation  Date/Time:  Saturday March 08 2022 11:44:41 EDT Ventricular Rate:  65 PR Interval:  159 QRS Duration: 94 QT Interval:  409 QTC Calculation: 426 R Axis:   -45 Text Interpretation: Sinus rhythm Left anterior fascicular block Abnormal R-wave progression, early transition Borderline T abnormalities, diffuse leads No significant change since last tracing Confirmed by Gloris Manchester [phone]) on 03/08/2022 3:16:49 PM  Radiology DG Chest 2 View  Result Date:  03/08/2022 CLINICAL DATA:  chest pain and SOB EXAM: CHEST - 2 VIEW COMPARISON:  February 12, 2022 FINDINGS: The cardiomediastinal silhouette is unchanged and enlarged in contour.Tortuous thoracic aorta no pleural effusion. No pneumothorax. No acute pleuroparenchymal abnormality. Visualized abdomen is unremarkable. IMPRESSION: No acute cardiopulmonary abnormality. Electronically Signed   By: Meda Klinefelter M.D.   On: 03/08/2022 12:26    Procedures Procedures    Medications Ordered in ED Medications  guaiFENesin (ROBITUSSIN) 100 MG/5ML liquid 10 mL (10 mLs Oral Given 03/08/22 1318)  ipratropium-albuterol (DUONEB) 0.5-2.5 (3) MG/3ML nebulizer solution 3 mL (3 mLs Nebulization Given 03/08/22 1336)    ED Course/ Medical Decision Making/ A&P                           Medical Decision Making Amount and/or Complexity of Data Reviewed Labs: ordered. Radiology: ordered.  Risk OTC drugs. Prescription drug management.   76 year old female presents to the ED for evaluation.  Please see HPI for further details.  On examination the patient is afebrile nontachycardic.  Patient lung sounds are clear bilaterally, she is not hypoxic.  Patient abdomen is soft and compressible throughout.  The patient neurological examination shows no focal neurodeficits.  Due to patient complaint of chest tightness and shortness of breath we will proceed with chest x-ray, EKG, BMP, CBC, troponin, D-dimer, viral panel.  Patient CBC unremarkable.  The patient BMP is unremarkable.  The patient D-dimer is not elevated so I doubt PE.  Patient troponin is initially less than 2, we will collect a delta troponin out of an abundance of caution.  The patient respiratory panel is still pending.  Patient was treated with 1 round of breathing treatment DuoNeb as well as 10 mL of Robitussin.  The patient reports after receiving breathing treatment that her shortness of breath has abated.  At end my shift the patient work-up was  not complete.  The patient be signed out to [facility], PA-C for further management.  Final Clinical Impression(s) / ED Diagnoses Final diagnoses:  Shortness of breath    Rx / DC Orders ED Discharge Orders     None  Azucena Cecil, PA-C 03/08/22 1520    Godfrey Pick, MD 03/09/22 1303

## 2022-03-08 NOTE — ED Triage Notes (Signed)
Patient c/o SOB, intermittent chest pain and a sore throat x 3-4 days.

## 2022-03-08 NOTE — Discharge Instructions (Signed)
You were seen in the emergency room today for evaluation of your shortness of breath.  Your labs and imaging were unremarkable.  This is likely a bronchitis.  Apply.  Continue using your breathing treatments at home.  Please follow-up with your primary care doctor within the week.  If you have any concern, new or worsening symptoms, please return to the nearest emergency department for evaluation.  Contact a doctor if: Your condition does not get better as soon as expected. You have a hard time doing your normal activities, even after you rest. You have new symptoms. You cannot walk up stairs. You cannot exercise the way you normally do. Get help right away if: Your shortness of breath gets worse. You have trouble breathing when you are resting. You feel light-headed or you faint. You have a cough that is not helped by medicines. You cough up blood. You have pain with breathing. You have pain in your chest, arms, shoulders, or belly (abdomen). You have a fever. These symptoms may be an emergency. Get help right away. Call 911. Do not wait to see if the symptoms will go away. Do not drive yourself to the hospital.

## 2022-03-08 NOTE — ED Provider Notes (Signed)
Physical Exam  BP 109/71 (BP Location: Right Arm)   Pulse (!) 51   Temp 98.4 F (36.9 C) (Oral)   Resp 14   Ht 5\' 7"  (1.702 m)   Wt 90.3 kg   SpO2 100%   BMI 31.17 kg/m   Physical Exam Constitutional:      General: She is not in acute distress.    Appearance: Normal appearance. She is not ill-appearing or toxic-appearing.  Eyes:     General: No scleral icterus. Cardiovascular:     Rate and Rhythm: Bradycardia present.  Pulmonary:     Effort: Pulmonary effort is normal. No respiratory distress.     Breath sounds: Normal breath sounds. No decreased breath sounds, wheezing or rhonchi.  Skin:    General: Skin is dry.     Findings: No rash.  Neurological:     General: No focal deficit present.     Mental Status: She is alert. Mental status is at baseline.  Psychiatric:        Mood and Affect: Mood normal.     Procedures  Procedures  ED Course / MDM     Medical Decision Making Amount and/or Complexity of Data Reviewed Labs: ordered. Radiology: ordered.  Risk OTC drugs. Prescription drug management.   Accepted handoff at shift change from , PA-C. Please see prior provider note for more detail.   Briefly: Patient is 75 y.o. F presenting to the ER for evaluation of chest pain since last night with cough symptoms.   DDX: concern for COVID vs. ACS  Plan: Awaiting second troponin and COVID. Discharge home with follow up with PCP.   Patient's troponins are less than 2 on initial and on repeat.  Patient negative for COVID and flu.  On my evaluation, patient is well-appearing in no acute distress.  She speaking in full sentences with ease and is satting well on room air without any increased work of breathing.  Her lungs are clear to auscultation bilaterally.  No wheezing or rhonchi auscultated.  Will discharge home with follow-up with PCP.  We discussed return precautions and red flag symptoms.  Patient verbalized understanding agrees the plan.  Patient is  stable being discharged home in good condition.  Results for orders placed or performed during the hospital encounter of 03/08/22  Resp Panel by RT-PCR (Flu A&B, Covid) Anterior Nasal Swab   Specimen: Anterior Nasal Swab  Result Value Ref Range   SARS Coronavirus 2 by RT PCR NEGATIVE NEGATIVE   Influenza A by PCR NEGATIVE NEGATIVE   Influenza B by PCR NEGATIVE NEGATIVE  Basic metabolic panel  Result Value Ref Range   Sodium 136 135 - 145 mmol/L   Potassium 4.3 3.5 - 5.1 mmol/L   Chloride 104 98 - 111 mmol/L   CO2 22 22 - 32 mmol/L   Glucose, Bld 140 (H) 70 - 99 mg/dL   BUN 16 8 - 23 mg/dL   Creatinine, Ser 03/10/22 0.44 - 1.00 mg/dL   Calcium 2.20 8.9 - 25.4 mg/dL   GFR, Estimated 60 (L) >60 mL/min   Anion gap 10 5 - 15  CBC  Result Value Ref Range   WBC 6.8 4.0 - 10.5 K/uL   RBC 5.40 (H) 3.87 - 5.11 MIL/uL   Hemoglobin 15.6 (H) 12.0 - 15.0 g/dL   HCT 27.0 (H) 62.3 - 76.2 %   MCV 87.2 80.0 - 100.0 fL   MCH 28.9 26.0 - 34.0 pg   MCHC 33.1 30.0 - 36.0  g/dL   RDW 15.0 11.5 - 15.5 %   Platelets 231 150 - 400 K/uL   nRBC 0.0 0.0 - 0.2 %  D-dimer, quantitative  Result Value Ref Range   D-Dimer, Quant 0.48 0.00 - 0.50 ug/mL-FEU  Brain natriuretic peptide  Result Value Ref Range   B Natriuretic Peptide 15.7 0.0 - 100.0 pg/mL  Troponin I (High Sensitivity)  Result Value Ref Range   Troponin I (High Sensitivity) <2 <18 ng/L  Troponin I (High Sensitivity)  Result Value Ref Range   Troponin I (High Sensitivity) <2 <18 ng/L   DG Chest 2 View  Result Date: 03/08/2022 CLINICAL DATA:  chest pain and SOB EXAM: CHEST - 2 VIEW COMPARISON:  February 12, 2022 FINDINGS: The cardiomediastinal silhouette is unchanged and enlarged in contour.Tortuous thoracic aorta no pleural effusion. No pneumothorax. No acute pleuroparenchymal abnormality. Visualized abdomen is unremarkable. IMPRESSION: No acute cardiopulmonary abnormality. Electronically Signed   By: Valentino Saxon M.D.   On: 03/08/2022  12:26   DG Chest 2 View  Result Date: 02/12/2022 CLINICAL DATA:  Shortness of breath.  Productive cough. EXAM: CHEST - 2 VIEW COMPARISON:  10/15/2021 FINDINGS: The cardiomediastinal contours are normal. Mild peribronchial thickening. Pulmonary vasculature is normal. No consolidation, pleural effusion, or pneumothorax. No acute osseous abnormalities are seen. Probable ossified intra-articular bodies in both shoulder joints. IMPRESSION: Mild peribronchial thickening, can be seen with bronchitis or asthma. Electronically Signed   By: Keith Rake M.D.   On: 02/12/2022 20:29        Sherrell Puller, PA-C 03/08/22 1701    Tretha Sciara, MD 03/08/22 1745

## 2022-03-10 ENCOUNTER — Ambulatory Visit: Payer: Medicare Other | Admitting: Dietician

## 2022-03-10 NOTE — Progress Notes (Signed)
Error. Patient canceled appointment. 

## 2022-03-11 ENCOUNTER — Encounter: Payer: Medicare Other | Admitting: Physician Assistant

## 2022-03-12 DIAGNOSIS — I10 Essential (primary) hypertension: Secondary | ICD-10-CM | POA: Diagnosis not present

## 2022-03-12 DIAGNOSIS — R0602 Shortness of breath: Secondary | ICD-10-CM | POA: Diagnosis not present

## 2022-03-12 DIAGNOSIS — J45909 Unspecified asthma, uncomplicated: Secondary | ICD-10-CM | POA: Diagnosis not present

## 2022-03-12 DIAGNOSIS — R42 Dizziness and giddiness: Secondary | ICD-10-CM | POA: Diagnosis not present

## 2022-03-13 ENCOUNTER — Telehealth: Payer: Self-pay

## 2022-03-13 DIAGNOSIS — M5451 Vertebrogenic low back pain: Secondary | ICD-10-CM | POA: Diagnosis not present

## 2022-03-13 DIAGNOSIS — M542 Cervicalgia: Secondary | ICD-10-CM | POA: Diagnosis not present

## 2022-03-13 NOTE — Telephone Encounter (Signed)
     Patient  visit on 9/30  at [facility]  Have you been able to follow up with your primary care physician?  yes  The patient was or was not able to obtain any needed medicine or equipment.  yes  Are there diet recommendations that you are having difficulty following? na  Patient expresses understanding of discharge instructions and education provided has no other needs at this time. yes     Donna Garner [facility], [place], Care Management  [phone] [address], [place], [place] [address] Phone: [phone] Email: [email or web address]    

## 2022-03-26 DIAGNOSIS — E1169 Type 2 diabetes mellitus with other specified complication: Secondary | ICD-10-CM | POA: Diagnosis not present

## 2022-03-26 DIAGNOSIS — J4540 Moderate persistent asthma, uncomplicated: Secondary | ICD-10-CM | POA: Diagnosis not present

## 2022-03-26 DIAGNOSIS — E559 Vitamin D deficiency, unspecified: Secondary | ICD-10-CM | POA: Diagnosis not present

## 2022-03-26 DIAGNOSIS — I10 Essential (primary) hypertension: Secondary | ICD-10-CM | POA: Diagnosis not present

## 2022-03-26 DIAGNOSIS — F411 Generalized anxiety disorder: Secondary | ICD-10-CM | POA: Diagnosis not present

## 2022-03-28 ENCOUNTER — Encounter: Payer: Self-pay | Admitting: Internal Medicine

## 2022-03-28 ENCOUNTER — Ambulatory Visit: Payer: Medicare Other | Attending: Internal Medicine | Admitting: Internal Medicine

## 2022-03-28 VITALS — BP 118/68 | HR 58 | Ht 67.0 in | Wt 193.0 lb

## 2022-03-28 DIAGNOSIS — I10 Essential (primary) hypertension: Secondary | ICD-10-CM | POA: Diagnosis not present

## 2022-03-28 NOTE — Progress Notes (Signed)
Cardiology Office Note:    Date:  03/28/2022   ID:  Donna Garner, DOB 12/21/1945, MRN [id number]  PCP:  Shon Hale, MD  [facility] Cardiologist:  Christell Constant, MD  [facility] Electrophysiologist:  None   CC: Follow up DOE  History of Present Illness:    Donna Garner is a 75 y.o. female with a hx of Diabetes with HTN, Asthma, with history of HFpEF and LVH in [place] ([facility]. [facility] [place]) who presented for evaluation 05/08/20.   2021: 2022- CT showed moderate non obstructive CAD with normal FFR.  Has worked with [facility] with improvement in BP 2023: Normal Ziopatch, had episode of HTN.  Had 02/12/22 had shortness of breath and chest tightness.  Felt like asthma- was diagnosed with bronchitis.  Patient notes that he is doing ok.   Noets that her SOB has improved but not resolved. Has worsening DOE. Chest tightness is improving. Has productive cough. No syncope. Still feels heart heart beating out more with BP controlled. LDL is at goal   AMB 127/73; she brought in cuff today.   Past Medical History:  Diagnosis Date   Asthma    Cardiomegaly    CHF (congestive heart failure) (HCC)    Cough    Diabetes (HCC)    Dyspnea    History of swelling of feet    Hypertension    SOB (shortness of breath)    Swelling of ankle     History reviewed. No pertinent surgical history.  Current Medications: Current Meds  Medication Sig   albuterol (PROVENTIL) (2.5 MG/3ML) 0.083% nebulizer solution Take 3 mLs (2.5 mg total) by nebulization every 6 (six) hours as needed for wheezing or shortness of breath.   albuterol (VENTOLIN HFA) 108 (90 Base) MCG/ACT inhaler    aspirin EC 81 MG tablet Take 81 mg by mouth daily. Swallow whole.   atenolol (TENORMIN) 100 MG tablet TAKE 1 TABLET(100 MG) BY MOUTH DAILY   Cholecalciferol (VITAMIN D3) 50 MCG (2000 UT) TABS 1 tablet   FARXIGA 5 MG TABS tablet Take 5 mg by mouth daily.   fluticasone  (FLONASE) 50 MCG/ACT nasal spray 1 spray in each nostril   furosemide (LASIX) 20 MG tablet TAKE 1 TABLET(20 MG) BY MOUTH DAILY   guaiFENesin (MUCINEX) 600 MG 12 hr tablet Take by mouth 2 (two) times daily as needed for cough or to loosen phlegm.   mometasone-formoterol (DULERA) 100-5 MCG/ACT AERO Inhale 2 puffs into the lungs 2 (two) times daily.   montelukast (SINGULAIR) 10 MG tablet Take 10 mg by mouth daily.   pantoprazole (PROTONIX) 40 MG tablet Take 40 mg by mouth daily.   rosuvastatin (CRESTOR) 10 MG tablet Take 10 mg by mouth daily.   spironolactone (ALDACTONE) 50 MG tablet Take 1 tablet (50 mg total) by mouth daily.   traMADol (ULTRAM) 50 MG tablet tramadol 50 mg tablet  Take 1 tablet 3 times a day by oral route as needed for pain for 5 days.   valsartan (DIOVAN) 160 MG tablet TAKE 1 TABLET(160 MG) BY MOUTH DAILY    Allergies:   Glipizide, Hydroxychloroquine, Ibuprofen, Metformin, Amlodipine, Dulaglutide, Empagliflozin, Metformin and related, Saline nasal spray, and Zyrtec [cetirizine]   Social History   Socioeconomic History   Marital status: Legally Separated    Spouse name: Not on file   Number of children: Not on file   Years of education: Not on file   Highest education level:  Not on file  Occupational History   Not on file  Tobacco Use   Smoking status: Former   Smokeless tobacco: Never  Vaping Use   Vaping Use: Never used  Substance and Sexual Activity   Alcohol use: Never   Drug use: Never   Sexual activity: Not on file  Other Topics Concern   Not on file  Social History Narrative   Not on file   Social Determinants of Health   Financial Resource Strain: Not on file  Food Insecurity: No Food Insecurity (03/03/2022)   Hunger Vital Sign    Worried About Running Out of Food in the Last Year: Never true    Ran Out of Food in the Last Year: Never true  Transportation Needs: No Transportation Needs (03/05/2022)   PRAPARE - Administrator, Civil Service  (Medical): No    Lack of Transportation (Non-Medical): No  Physical Activity: Not on file  Stress: Not on file  Social Connections: Not on file    SOCIAL: Daughter's name Is Donna Garner who comes to every visit. They moved and now have access to a pool (2023)  Family History: The patient's brother and sister died from heart disease NOS  ROS:   Please see the history of present illness.    All other systems reviewed and are negative.  EKGs/Labs/Other Studies Reviewed:    The following studies were reviewed today:  EKG:   04/17/20:  SR rate 82 no ST/T changes  Transthoracic Echocardiogram: Date: 06/05/20 Results: Basal Septal Hypertrophy without SAM or evidence of LVOT gradient 1. Left ventricular ejection fraction, by estimation, is 65 to 70%. Left  ventricular ejection fraction by 3D volume is 69 %. The left ventricle has  normal function. The left ventricle has no regional wall motion  abnormalities. There is severe left  ventricular hypertrophy of the basal-septal segment. Left ventricular  diastolic parameters are consistent with Grade I diastolic dysfunction  (impaired relaxation). Elevated left ventricular end-diastolic pressure.  The average left ventricular global  longitudinal strain is -17.7 %. The global longitudinal strain is normal.   2. Right ventricular systolic function is normal. The right ventricular  size is normal. There is normal pulmonary artery systolic pressure. The  estimated right ventricular systolic pressure is 29.6 mmHg.   3. The mitral valve is normal in structure. Mild mitral valve  regurgitation. No evidence of mitral stenosis.   4. The aortic valve is tricuspid. Aortic valve regurgitation is trivial.  Mild aortic valve sclerosis is present, with no evidence of aortic valve  stenosis.   5. The inferior vena cava is normal in size with greater than 50%  respiratory variability, suggesting right atrial pressure of 3 mmHg.   Cardiac CT: Date:  08/20/20 Results: IMPRESSION: 1. Coronary calcium score of 255. This was 83rd percentile for age, sex, and race matched control.   2. Normal coronary origin.  Right dominance.   3. CAD-RADS 3. Moderate stenosis in the LAD and LCX. Consider symptom-guided anti-ischemic pharmacotherapy as well as risk factor modification per guideline directed care. Additional analysis with CT FFR will be submitted.   4. Aortic atherosclerosis noted.   PA dilation Normal FFR.  Vascular Venous Duplex: Date: 08/02/20 Results: Summary:  Bilateral:  - No evidence of deep vein thrombosis seen in the lower extremities,  bilaterally, from the common femoral through the mid posterior tibial and  peroneal veins.  - No evidence of deep venous insufficiency seen bilaterally in the lower  extremity.  -  No evidence of superficial venous reflux seen in the greater saphenous  veins bilaterally.  - No evidence of superficial venous reflux seen in the short saphenous  veins bilaterally.     Right:  - Fluid seen around the posterior aspect of tibia and joint space. No  Baker's cyst seen.     Left:  - Fluid seen around the posterior aspect of tibia and joint space. No  Baker's cyst seen.     Recent Labs: 09/29/2021: Magnesium 2.1 02/12/2022: ALT 22 03/08/2022: B Natriuretic Peptide 15.7; BUN 16; Creatinine, Ser 0.98; Hemoglobin 15.6; Platelets 231; Potassium 4.3; Sodium 136  Recent Lipid Panel No results found for: "CHOL", "TRIG", "HDL", "CHOLHDL", "VLDL", "LDLCALC", "LDLDIRECT"   Physical Exam:    VS:  BP 118/68   Pulse (!) 58   Ht 5\' 7"  (1.702 m)   Wt 193 lb (87.5 kg)   SpO2 97%   BMI 30.23 kg/m     Wt Readings from Last 3 Encounters:  03/28/22 193 lb (87.5 kg)  03/08/22 199 lb (90.3 kg)  02/12/22 199 lb (90.3 kg)    Gen: No distress Neck: No JVD,  Ears: No Pilar Plate Sign Cardiac: No Rubs or Gallops, No murmur, RRR, +2 radial pulses Respiratory: Clear to auscultation bilaterally, normal effort,  normal  respiratory rate GI: Soft, nontender, non-distended  MS: No  edema;  moves all extremities Integument: Skin feels  Neuro:  At time of evaluation, alert and oriented to person/place/time/situation  Psych: Normal affect, patient feels well  ASSESSMENT:    No diagnosis found.  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  Concerns regarding medicines are outlined above.  No orders of the defined types were placed in this encounter.  No orders of the defined types were placed in this encounter.   There are no Patient Instructions on file for this visit.   Signed, Werner Lean, MD  03/28/2022 4:12 PM    [facility]

## 2022-03-28 NOTE — Patient Instructions (Signed)
Medication Instructions:  Your physician recommends that you continue on your current medications as directed. Please refer to the Current Medication list given to you today.  *If you need a refill on your cardiac medications before your next appointment, please call your pharmacy*   Lab Work: NONE If you have labs (blood work) drawn today and your tests are completely normal, you will receive your results only by: [employer] (if you have MyChart) OR A paper copy in the mail If you have any lab test that is abnormal or we need to change your treatment, we will call you to review the results.   Testing/Procedures: Your physician has requested that you wear a heart monitor.   Follow-Up: At [facility], you and your health needs are our priority.  As part of our continuing mission to provide you with exceptional heart care, we have created designated Provider Care Teams.  These Care Teams include your primary Cardiologist (physician) and Advanced Practice Providers (APPs -  Physician Assistants and Nurse Practitioners) who all work together to provide you with the care you need, when you need it.  We recommend signing up for the patient portal called "MyChart".  Sign up information is provided on this After Visit Summary.  MyChart is used to connect with patients for Virtual Visits (Telemedicine).  Patients are able to view lab/test results, encounter notes, upcoming appointments, etc.  Non-urgent messages can be sent to your provider as well.   To learn more about what you can do with MyChart, go to [email or web address].    Your next appointment:   4 month(s)  The format for your next appointment:   In Person  Provider:   Melina Copa, PA-C, Ambrose Pancoast, NP, or Ermalinda Barrios, PA-C       Other Instructions Bryn Gulling- Long Term Monitor Instructions  Your physician has requested you wear a ZIO patch monitor for 7 days.  This is a single patch monitor. [employer] supplies one  patch monitor per enrollment. Additional stickers are not available. Please do not apply patch if you will be having a Nuclear Stress Test,  Echocardiogram, Cardiac CT, MRI, or Chest Xray during the period you would be wearing the  monitor. The patch cannot be worn during these tests. You cannot remove and re-apply the  ZIO XT patch monitor.  Your ZIO patch monitor will be mailed 3 day USPS to your address on file. It may take 3-5 days  to receive your monitor after you have been enrolled.  Once you have received your monitor, please review the enclosed instructions. Your monitor  has already been registered assigning a specific monitor serial # to you.  Billing and Patient Assistance Program Information  We have supplied [employer] with any of your insurance information on file for billing purposes. [employer] offers a sliding scale Patient Assistance Program for patients that do not have  insurance, or whose insurance does not completely cover the cost of the ZIO monitor.  You must apply for the Patient Assistance Program to qualify for this discounted rate.  To apply, please call [employer] at [phone], select option 4, select option 2, ask to apply for  Patient Assistance Program. Theodore Demark will ask your household income, and how many people  are in your household. They will quote your out-of-pocket cost based on that information.  [employer] will also be able to set up a 80-month, interest-free payment plan if needed.  Applying the monitor   Shave hair from upper left chest.  Hold abrader disc by orange tab. Rub abrader in 40 strokes over the upper left chest as  indicated in your monitor instructions.  Clean area with 4 enclosed alcohol pads. Let dry.  Apply patch as indicated in monitor instructions. Patch will be placed under collarbone on left  side of chest with arrow pointing upward.  Rub patch adhesive wings for 2 minutes. Remove white label marked "1". Remove the white  label marked  "2". Rub patch adhesive wings for 2 additional minutes.  While looking in a mirror, press and release button in center of patch. A small green light will  flash 3-4 times. This will be your only indicator that the monitor has been turned on.  Do not shower for the first 24 hours. You may shower after the first 24 hours.  Press the button if you feel a symptom. You will hear a small click. Record Date, Time and  Symptom in the Patient Logbook.  When you are ready to remove the patch, follow instructions on the last 2 pages of Patient  Logbook. Stick patch monitor onto the last page of Patient Logbook.  Place Patient Logbook in the blue and white box. Use locking tab on box and tape box closed  securely. The blue and white box has prepaid postage on it. Please place it in the mailbox as  soon as possible. Your physician should have your test results approximately 7 days after the  monitor has been mailed back to [facility].  Call [facility] Customer Care at [phone] if you have questions regarding  your ZIO XT patch monitor. Call them immediately if you see an orange light blinking on your  monitor.  If your monitor falls off in less than 4 days, contact our Monitor department at [phone].  If your monitor becomes loose or falls off after 4 days call [employer] at [phone] for  suggestions on securing your monitor   Important Information About Sugar

## 2022-03-31 ENCOUNTER — Ambulatory Visit: Payer: Medicare Other | Attending: Internal Medicine

## 2022-03-31 ENCOUNTER — Other Ambulatory Visit: Payer: Self-pay | Admitting: Internal Medicine

## 2022-03-31 DIAGNOSIS — R002 Palpitations: Secondary | ICD-10-CM

## 2022-03-31 DIAGNOSIS — I10 Essential (primary) hypertension: Secondary | ICD-10-CM

## 2022-03-31 NOTE — Progress Notes (Unsigned)
Enrolled for [employer] to mail a ZIO XT long term holter monitor to the patients address on file.  

## 2022-04-03 DIAGNOSIS — I10 Essential (primary) hypertension: Secondary | ICD-10-CM | POA: Diagnosis not present

## 2022-04-03 DIAGNOSIS — E1169 Type 2 diabetes mellitus with other specified complication: Secondary | ICD-10-CM | POA: Diagnosis not present

## 2022-04-03 DIAGNOSIS — N1831 Chronic kidney disease, stage 3a: Secondary | ICD-10-CM | POA: Diagnosis not present

## 2022-04-03 DIAGNOSIS — E785 Hyperlipidemia, unspecified: Secondary | ICD-10-CM | POA: Diagnosis not present

## 2022-04-04 ENCOUNTER — Institutional Professional Consult (permissible substitution): Payer: Medicare Other | Admitting: Pulmonary Disease

## 2022-04-09 DIAGNOSIS — R002 Palpitations: Secondary | ICD-10-CM | POA: Diagnosis not present

## 2022-04-09 DIAGNOSIS — I10 Essential (primary) hypertension: Secondary | ICD-10-CM

## 2022-04-14 ENCOUNTER — Ambulatory Visit: Payer: Medicare Other | Admitting: Pulmonary Disease

## 2022-04-14 ENCOUNTER — Encounter: Payer: Self-pay | Admitting: Pulmonary Disease

## 2022-04-14 VITALS — BP 136/74 | HR 65 | Temp 98.2°F | Ht 67.0 in | Wt 195.4 lb

## 2022-04-14 DIAGNOSIS — J45909 Unspecified asthma, uncomplicated: Secondary | ICD-10-CM

## 2022-04-14 NOTE — Progress Notes (Signed)
Donna Garner    [id number]    [birth year]  Primary Care Physician:Timberlake, Meridee Score, MD  Referring Physician: Shon Hale, MD [address] [place],  [place] [address]  Chief complaint:   History of asthma  HPI:  Was recently hospitalized for shortness of breath Chest x-ray at the time was normal  She does have some wheezing in the morning but when she gets her stopped going, usually able to tolerate activities  Some limitation with chronic back pain, neck pain and arthritis  Does have a history of diastolic heart failure  Past history of smoking quit many years ago-1986  Worked in education  Has been staying active, some weight loss recently with watching her diet and staying active  Uses Dulera twice a day and does remember to rinse, albuterol use about 2-3 times a month  Outpatient Encounter Medications as of 04/14/2022  Medication Sig   albuterol (PROVENTIL) (2.5 MG/3ML) 0.083% nebulizer solution Take 3 mLs (2.5 mg total) by nebulization every 6 (six) hours as needed for wheezing or shortness of breath.   albuterol (VENTOLIN HFA) 108 (90 Base) MCG/ACT inhaler    aspirin EC 81 MG tablet Take 81 mg by mouth daily. Swallow whole.   atenolol (TENORMIN) 100 MG tablet TAKE 1 TABLET(100 MG) BY MOUTH DAILY   Cholecalciferol (VITAMIN D3) 50 MCG (2000 UT) TABS 1 tablet   FARXIGA 5 MG TABS tablet Take 5 mg by mouth daily.   fluticasone (FLONASE) 50 MCG/ACT nasal spray 1 spray in each nostril   furosemide (LASIX) 20 MG tablet TAKE 1 TABLET(20 MG) BY MOUTH DAILY   guaiFENesin (MUCINEX) 600 MG 12 hr tablet Take by mouth 2 (two) times daily as needed for cough or to loosen phlegm.   mometasone-formoterol (DULERA) 100-5 MCG/ACT AERO Inhale 2 puffs into the lungs 2 (two) times daily.   montelukast (SINGULAIR) 10 MG tablet Take 10 mg by mouth daily.   pantoprazole (PROTONIX) 40 MG tablet Take 40 mg by mouth daily.   rosuvastatin (CRESTOR) 10 MG tablet  Take 10 mg by mouth daily.   spironolactone (ALDACTONE) 50 MG tablet Take 1 tablet (50 mg total) by mouth daily.   traMADol (ULTRAM) 50 MG tablet tramadol 50 mg tablet  Take 1 tablet 3 times a day by oral route as needed for pain for 5 days.   valsartan (DIOVAN) 160 MG tablet TAKE 1 TABLET(160 MG) BY MOUTH DAILY   No facility-administered encounter medications on file as of 04/14/2022.    Allergies as of 04/14/2022 - Review Complete 04/14/2022  Allergen Reaction Noted   Glipizide Other (See Comments) and Rash 05/08/2020   Hydroxychloroquine Other (See Comments) and Rash 05/08/2020   Ibuprofen Shortness Of Breath and Other (See Comments) 05/08/2020   Metformin Other (See Comments) 06/28/2020   Amlodipine Swelling 08/28/2020   Dulaglutide  12/23/2021   Empagliflozin  12/23/2021   Metformin and related  05/08/2020   Saline nasal spray  05/08/2020   Zyrtec [cetirizine]  05/08/2020    Past Medical History:  Diagnosis Date   Asthma    Cardiomegaly    CHF (congestive heart failure) (HCC)    Cough    Diabetes (HCC)    Dyspnea    History of swelling of feet    Hypertension    SOB (shortness of breath)    Swelling of ankle     No past surgical history on file.  Family History  Problem Relation Age  of Onset   Heart disease Sister    Heart disease Brother     Social History   Socioeconomic History   Marital status: Legally Separated    Spouse name: Not on file   Number of children: Not on file   Years of education: Not on file   Highest education level: Not on file  Occupational History   Not on file  Tobacco Use   Smoking status: Former    Packs/day: 1.00    Years: 22.00    Total pack years: 22.00    Types: Cigarettes    Start date: 2    Quit date: 70    Years since quitting: 37.8   Smokeless tobacco: Never  Vaping Use   Vaping Use: Never used  Substance and Sexual Activity   Alcohol use: Never   Drug use: Never   Sexual activity: Not on file  Other  Topics Concern   Not on file  Social History Narrative   Not on file   Social Determinants of Health   Financial Resource Strain: Not on file  Food Insecurity: No Food Insecurity (03/03/2022)   Hunger Vital Sign    Worried About Running Out of Food in the Last Year: Never true    Ran Out of Food in the Last Year: Never true  Transportation Needs: No Transportation Needs (03/05/2022)   PRAPARE - Hydrologist (Medical): No    Lack of Transportation (Non-Medical): No  Physical Activity: Not on file  Stress: Not on file  Social Connections: Not on file  Intimate Partner Violence: Not on file    Review of Systems  Respiratory:  Positive for shortness of breath.     Vitals:   04/14/22 0846  BP: 136/74  Pulse: 65  Temp: 98.2 F (36.8 C)  SpO2: 99%     Physical Exam Constitutional:      Appearance: She is obese.  HENT:     Head: Normocephalic.     Mouth/Throat:     Mouth: Mucous membranes are moist.  Cardiovascular:     Rate and Rhythm: Normal rate and regular rhythm.     Heart sounds: No murmur heard.    No friction rub.  Pulmonary:     Effort: No respiratory distress.     Breath sounds: No stridor. No wheezing or rhonchi.  Musculoskeletal:     Cervical back: No rigidity or tenderness.  Neurological:     Mental Status: She is alert.  Psychiatric:        Mood and Affect: Mood normal.    Data Reviewed: Most recent chest x-ray reviewed showing no acute infiltrate  Last echocardiogram 06/05/2020-diastolic dysfunction, normal ejection fraction  Assessment:  Moderate persistent asthma  Inhaler technique was reviewed today and discussed how to optimize use of inhalers  Graded exercises discussed  History of diabetes-controlled History of high blood pressure-stable  Plan/Recommendations: Graded exercise as tolerated  PFT at next visit  Continue current inhalers including Dulera and albuterol as needed  No reason to change the  inhaler at present  Follow-up in 3 months  Encouraged to call with significant concerns   Sherrilyn Rist MD [facility] 04/14/2022, 9:04 AM  CC: Glenis Smoker, *

## 2022-04-14 NOTE — Patient Instructions (Signed)
I will see you back in about 3 months  Continue Dulera and albuterol as needed  Pay attention to how you use the inhaler  Continue regular exercises as tolerated  We will get a breathing study at the day of next visit  Call us with significant concerns

## 2022-04-15 DIAGNOSIS — M5451 Vertebrogenic low back pain: Secondary | ICD-10-CM | POA: Diagnosis not present

## 2022-04-15 DIAGNOSIS — Z4789 Encounter for other orthopedic aftercare: Secondary | ICD-10-CM | POA: Diagnosis not present

## 2022-04-15 DIAGNOSIS — M5416 Radiculopathy, lumbar region: Secondary | ICD-10-CM | POA: Diagnosis not present

## 2022-04-15 DIAGNOSIS — M5412 Radiculopathy, cervical region: Secondary | ICD-10-CM | POA: Diagnosis not present

## 2022-04-21 DIAGNOSIS — I10 Essential (primary) hypertension: Secondary | ICD-10-CM | POA: Diagnosis not present

## 2022-04-21 DIAGNOSIS — R002 Palpitations: Secondary | ICD-10-CM | POA: Diagnosis not present

## 2022-04-27 ENCOUNTER — Other Ambulatory Visit: Payer: Self-pay | Admitting: Internal Medicine

## 2022-04-27 DIAGNOSIS — I10 Essential (primary) hypertension: Secondary | ICD-10-CM

## 2022-05-07 DIAGNOSIS — E1169 Type 2 diabetes mellitus with other specified complication: Secondary | ICD-10-CM | POA: Diagnosis not present

## 2022-05-07 DIAGNOSIS — J4540 Moderate persistent asthma, uncomplicated: Secondary | ICD-10-CM | POA: Diagnosis not present

## 2022-05-07 DIAGNOSIS — E785 Hyperlipidemia, unspecified: Secondary | ICD-10-CM | POA: Diagnosis not present

## 2022-05-07 DIAGNOSIS — I10 Essential (primary) hypertension: Secondary | ICD-10-CM | POA: Diagnosis not present

## 2022-05-14 DIAGNOSIS — E119 Type 2 diabetes mellitus without complications: Secondary | ICD-10-CM | POA: Diagnosis not present

## 2022-05-15 DIAGNOSIS — M5416 Radiculopathy, lumbar region: Secondary | ICD-10-CM | POA: Diagnosis not present

## 2022-06-10 NOTE — Progress Notes (Unsigned)
Cardiology Office Note:    Date:  06/11/2022   ID:  Donna Garner, DOB 1946/05/10, MRN [id number]  PCP:  Glenis Smoker, MD  [facility] Cardiologist:  Werner Lean, MD  [facility] Electrophysiologist:  None   CC: Follow up DOE  History of Present Illness:    Donna Garner is a 77 y.o. female with a hx of Diabetes with HTN, Asthma, with history of HFpEF and LVH in [place] (Dr. [facility]) who presented for evaluation 05/08/20.   2021: 2022- CT showed moderate non obstructive CAD with normal FFR.  Has worked with [facility] with improvement in BP 2023: Normal [place], had episode of HTN.  Had 02/12/22 had shortness of breath and chest tightness.  Felt like asthma- was diagnosed with bronchitis. Heart monitor looked good.  Patient notes that she is doing OK.   Has had some arthritis. Leg swelling has worsened for a couple of weeks. Had a very good dinner: smoked baby back ribs, and potato salad. There are no interval hospital/ED visit.    No chest pain or pressure.  No SOB/DOE and no PND/Orthopnea.  Had weighed about 3 lbs  Has need a rare breathing treatment. No palpitations or syncope.  Having back and knee pain Ambulatory blood pressure 140/78 except when pain is controlled   Past Medical History:  Diagnosis Date   Asthma    Cardiomegaly    CHF (congestive heart failure) (HCC)    Cough    Diabetes (HCC)    Dyspnea    History of swelling of feet    Hypertension    SOB (shortness of breath)    Swelling of ankle     History reviewed. No pertinent surgical history.  Current Medications: Current Meds  Medication Sig   albuterol (PROVENTIL) (2.5 MG/3ML) 0.083% nebulizer solution Take 3 mLs (2.5 mg total) by nebulization every 6 (six) hours as needed for wheezing or shortness of breath.   albuterol (VENTOLIN HFA) 108 (90 Base) MCG/ACT inhaler    aspirin EC 81 MG tablet Take 81 mg by mouth daily. Swallow whole.    atenolol (TENORMIN) 100 MG tablet TAKE 1 TABLET(100 MG) BY MOUTH DAILY   Cholecalciferol (VITAMIN D3) 50 MCG (2000 UT) TABS 1 tablet   FARXIGA 5 MG TABS tablet Take 5 mg by mouth daily.   fluticasone (FLONASE) 50 MCG/ACT nasal spray 1 spray in each nostril   furosemide (LASIX) 20 MG tablet TAKE 1 TABLET(20 MG) BY MOUTH DAILY   furosemide (LASIX) 40 MG tablet Take 1 tablet (40 mg total) by mouth daily.   guaiFENesin (MUCINEX) 600 MG 12 hr tablet Take by mouth 2 (two) times daily as needed for cough or to loosen phlegm.   mometasone-formoterol (DULERA) 100-5 MCG/ACT AERO Inhale 2 puffs into the lungs 2 (two) times daily.   montelukast (SINGULAIR) 10 MG tablet Take 10 mg by mouth daily.   pantoprazole (PROTONIX) 40 MG tablet Take 40 mg by mouth daily.   rosuvastatin (CRESTOR) 10 MG tablet Take 10 mg by mouth daily.   spironolactone (ALDACTONE) 50 MG tablet Take 1 tablet (50 mg total) by mouth daily.   traMADol (ULTRAM) 50 MG tablet tramadol 50 mg tablet  Take 1 tablet 3 times a day by oral route as needed for pain for 5 days.   valsartan (DIOVAN) 160 MG tablet TAKE 1 TABLET(160 MG) BY MOUTH DAILY    Allergies:   Glipizide, Hydroxychloroquine, Ibuprofen, Metformin, Amlodipine, Dulaglutide,  Empagliflozin, Metformin and related, Saline nasal spray, and Zyrtec [cetirizine]   Social History   Socioeconomic History   Marital status: Legally Separated    Spouse name: Not on file   Number of children: Not on file   Years of education: Not on file   Highest education level: Not on file  Occupational History   Not on file  Tobacco Use   Smoking status: Former    Packs/day: 1.00    Years: 22.00    Total pack years: 22.00    Types: Cigarettes    Start date: 40    Quit date: 74    Years since quitting: 38.0   Smokeless tobacco: Never  Vaping Use   Vaping Use: Never used  Substance and Sexual Activity   Alcohol use: Never   Drug use: Never   Sexual activity: Not on file  Other Topics  Concern   Not on file  Social History Narrative   Not on file   Social Determinants of Health   Financial Resource Strain: Not on file  Food Insecurity: No Food Insecurity (03/03/2022)   Hunger Vital Sign    Worried About Running Out of Food in the Last Year: Never true    Ran Out of Food in the Last Year: Never true  Transportation Needs: No Transportation Needs (03/05/2022)   PRAPARE - Administrator, Civil Service (Medical): No    Lack of Transportation (Non-Medical): No  Physical Activity: Not on file  Stress: Not on file  Social Connections: Not on file    SOCIAL: Daughter's name Is Lauris Poag who comes to every visit. They moved and now have access to a pool (2023)  Family History: The patient's brother and sister died from heart disease NOS  ROS:   Please see the history of present illness.    All other systems reviewed and are negative.  EKGs/Labs/Other Studies Reviewed:    The following studies were reviewed today:  EKG:   04/17/20:  SR rate 82 no ST/T changes  Cardiac Studies & Procedures       ECHOCARDIOGRAM  ECHOCARDIOGRAM COMPLETE 06/05/2020  Narrative ECHOCARDIOGRAM REPORT    Patient Name:   Donna Garner Date of Exam: 06/05/2020 Medical Rec #:  [id number]     Height:       67.0 in Accession #:    [id number]    Weight:       214.4 lb Date of Birth:  Dec 06, 1945    BSA:          2.083 m Patient Age:    73 years      BP:           104/58 mmHg Patient Gender: F             HR:           61 bpm. Exam Location:  Outpatient  Procedure: 2D Echo, 3D Echo, Cardiac Doppler and Color Doppler  Indications:    CHF (congestive heart failure)  History:        Patient has no prior history of Echocardiogram examinations. Risk Factors:Hypertension and Diabetes. Cardiomegaly. Edema.  Sonographer:    Leta Jungling RDCS Referring Phys: [id number] [facility] A Izora Ribas   Sonographer Comments: Global longitudinal strain was attempted. IMPRESSIONS   1.  Left ventricular ejection fraction, by estimation, is 65 to 70%. Left ventricular ejection fraction by 3D volume is 69 %. The left ventricle has normal function. The left ventricle has no regional wall  motion abnormalities. There is severe left ventricular hypertrophy of the basal-septal segment. Left ventricular diastolic parameters are consistent with Grade I diastolic dysfunction (impaired relaxation). Elevated left ventricular end-diastolic pressure. The average left ventricular global longitudinal strain is -17.7 %. The global longitudinal strain is normal. 2. Right ventricular systolic function is normal. The right ventricular size is normal. There is normal pulmonary artery systolic pressure. The estimated right ventricular systolic pressure is 29.6 mmHg. 3. The mitral valve is normal in structure. Mild mitral valve regurgitation. No evidence of mitral stenosis. 4. The aortic valve is tricuspid. Aortic valve regurgitation is trivial. Mild aortic valve sclerosis is present, with no evidence of aortic valve stenosis. 5. The inferior vena cava is normal in size with greater than 50% respiratory variability, suggesting right atrial pressure of 3 mmHg.  FINDINGS Left Ventricle: Left ventricular ejection fraction, by estimation, is 65 to 70%. Left ventricular ejection fraction by 3D volume is 69 %. The left ventricle has normal function. The left ventricle has no regional wall motion abnormalities. The average left ventricular global longitudinal strain is -17.7 %. The global longitudinal strain is normal. The left ventricular internal cavity size was normal in size. There is severe left ventricular hypertrophy of the basal-septal segment. Left ventricular diastolic parameters are consistent with Grade I diastolic dysfunction (impaired relaxation). Elevated left ventricular end-diastolic pressure.  Right Ventricle: The right ventricular size is normal. No increase in right ventricular wall thickness.  Right ventricular systolic function is normal. There is normal pulmonary artery systolic pressure. The tricuspid regurgitant velocity is 2.58 m/s, and with an assumed right atrial pressure of 3 mmHg, the estimated right ventricular systolic pressure is 29.6 mmHg.  Left Atrium: Left atrial size was normal in size.  Right Atrium: Right atrial size was normal in size.  Pericardium: There is no evidence of pericardial effusion.  Mitral Valve: The mitral valve is normal in structure. Mild mitral valve regurgitation. No evidence of mitral valve stenosis.  Tricuspid Valve: The tricuspid valve is normal in structure. Tricuspid valve regurgitation is mild . No evidence of tricuspid stenosis.  Aortic Valve: The aortic valve is tricuspid. Aortic valve regurgitation is trivial. Mild aortic valve sclerosis is present, with no evidence of aortic valve stenosis.  Pulmonic Valve: The pulmonic valve was normal in structure. Pulmonic valve regurgitation is mild. No evidence of pulmonic stenosis.  Aorta: The aortic root is normal in size and structure.  Venous: The inferior vena cava is normal in size with greater than 50% respiratory variability, suggesting right atrial pressure of 3 mmHg.  IAS/Shunts: No atrial level shunt detected by color flow Doppler.   LEFT VENTRICLE PLAX 2D LVIDd:         4.50 cm         Diastology LVIDs:         3.10 cm         LV e' medial:    3.30 cm/s LV PW:         1.00 cm         LV E/e' medial:  23.0 LV IVS:        1.00 cm         LV e' lateral:   5.29 cm/s LVOT diam:     2.00 cm         LV E/e' lateral: 14.3 LV SV:         68 LV SV Index:   33  2D LVOT Area:     3.14 cm        Longitudinal Strain 2D Strain GLS  -17.8 % (A2C): 2D Strain GLS  -18.1 % (A3C): 2D Strain GLS  -17.3 % (A4C): 2D Strain GLS  -17.7 % Avg:  3D Volume EF LV 3D EF:    Left ventricular ejection fraction by 3D volume is 69 %.  3D Volume EF: 3D EF:        69 % LV EDV:        123 ml LV ESV:       38 ml LV SV:        85 ml  RIGHT VENTRICLE RV S prime:     13.20 cm/s TAPSE (M-mode): 1.3 cm  LEFT ATRIUM             Index       RIGHT ATRIUM          Index LA diam:        3.50 cm 1.68 cm/m  RA Area:     9.70 cm LA Vol (A2C):   43.5 ml 20.88 ml/m RA Volume:   15.20 ml 7.30 ml/m LA Vol (A4C):   50.9 ml 24.44 ml/m LA Biplane Vol: 47.7 ml 22.90 ml/m AORTIC VALVE LVOT Vmax:   95.30 cm/s LVOT Vmean:  61.000 cm/s LVOT VTI:    0.218 m  AORTA Ao Root diam: 2.50 cm Ao Asc diam:  3.20 cm  MITRAL VALVE               TRICUSPID VALVE MV Area (PHT): 4.21 cm    TR Peak grad:   26.6 mmHg MV Decel Time: 180 msec    TR Vmax:        258.00 cm/s MV E velocity: 75.80 cm/s MV A velocity: 97.70 cm/s  SHUNTS MV E/A ratio:  0.78        Systemic VTI:  0.22 m Systemic Diam: 2.00 cm  Armanda Magicraci Turner MD Electronically signed by Armanda Magicraci Turner MD Signature Date/Time: 06/05/2020/9:42:09 AM    Final    MONITORS  LONG TERM MONITOR (3-14 DAYS) 04/27/2022  Narrative   Patient had a minimum heart rate of 42 bpm, maximum heart rate of 139 bpm, and average heart rate of 57 bpm.   Predominant underlying rhythm was sinus rhythm.   NSVT  lasting 6 beats at longest with a max rate of 139 bpm at fastest.   Isolated PACs were rare (<1.0%).   Isolated PVCs were rare (<1.0%).   Triggered and diary events associated with sinus rhythm.  Rare asymptomatic NSVT.   CT SCANS  CT CORONARY MORPH W/CTA COR W/SCORE 08/20/2020  Addendum 08/20/2020  3:23 PM ADDENDUM REPORT: 08/20/2020 15:20  CLINICAL DATA:  77 Year-old African American Female  EXAM: Cardiac/Coronary  CTA  TECHNIQUE: The patient was scanned on a [employer] scanner.  FINDINGS: A 100 kV prospective scan was triggered in the descending thoracic aorta at 111 HU's. Axial non-contrast 3 mm slices were carried out through the heart. The data set was analyzed on a dedicated work station and scored using the Agatson  method. Gantry rotation speed was 250 msecs and collimation was .6 mm. No beta blockade and 0.8 mg of sl NTG was given. The 3D data set was reconstructed in 5% intervals of the 67-82 % of the R-R cycle. Diastolic phases were analyzed on a dedicated work station using MPR, MIP and VRT modes. The patient received 80 cc of contrast.  Aorta:  Normal size.  Aortic atherosclerosis noted.  No dissection.  Aortic Valve:  Tri-leaflet.  No calcifications.  Coronary Arteries:  Normal coronary origin.  Right dominance.  Coronary calcium score of 255. This was 83rd percentile for age, sex, and race matched control.  RCA is a large dominant artery that gives rise to PDA and PLA. There is a minimal non-obstructive calcified plaque (1-24%) in the proximal vessel. There is a minimal non-obstructive calcified plaque (1-24%) in the mid vessel.  Left main is a large artery that gives rise to LAD and LCX arteries. There is no plaque.  LAD is a large vessel that gives rise to two diagonal vessels with a D1 bifurcation. There is a mild non-obstructive calcified plaque (25-49%) in the proximal vessel. There is a moderate stenosis calcified plaque (50-70%) in the mid vessel.  LCX is a non-dominant artery that gives rise to one OM1 branch and bifurcates to a OM2 vessel. There is a moderate stenosis calcified plaque (50-70%) in the proximal vessel. There is a mild non-obstructive calcified plaque (25-50%) in the mid vessel.  Other findings:  Normal pulmonary vein drainage into the left atrium.  Normal left atrial appendage without a thrombus.  Upper limit of normal pulmonary artery: 36 mm.  Extra-cardiac findings: See attached radiology report for non-cardiac structures.  IMPRESSION: 1. Coronary calcium score of 255. This was 83rd percentile for age, sex, and race matched control.  2. Normal coronary origin.  Right dominance.  3. CAD-RADS 3. Moderate stenosis in the LAD and LCX.  Consider symptom-guided anti-ischemic pharmacotherapy as well as risk factor modification per guideline directed care. Additional analysis with CT FFR will be submitted.  4. Aortic atherosclerosis noted.  5. Upper limit of normal pulmonary artery: 36 mm.   Electronically Signed By: Rudean Haskell MD On: 08/20/2020 15:20  Narrative EXAM: OVER-READ INTERPRETATION  CT CHEST  The following report is an over-read performed by radiologist Dr. Abigail Miyamoto of [facility], [place] on 08/20/2020. This over-read does not include interpretation of cardiac or coronary anatomy or pathology. The coronary CTA interpretation by the cardiologist is attached.  COMPARISON:  Chest radiographs including 07/31/2020  FINDINGS: Vascular: Aortic atherosclerosis. No central pulmonary embolism, on this non-dedicated study.  Mediastinum/Nodes: No imaged thoracic adenopathy. Small to moderate hiatal hernia.  Lungs/Pleura: No pleural fluid.  Clear imaged lungs.  Upper Abdomen: Normal imaged portions of the liver, spleen.  Musculoskeletal: Right breast 7 mm partially calcified nodule on 07/11. Lower thoracic spondylosis.  IMPRESSION: 1.  No acute findings in the imaged extracardiac chest. 2.  Aortic Atherosclerosis (ICD10-I70.0). 3. 7 mm partially calcified nonspecific right-sided breast nodule. Consider correlation with mammogram and possibly ultrasound. 4. Hiatal hernia.  These results will be called to the ordering clinician or representative by the Radiologist Assistant, and communication documented in the PACS or [employer].  Electronically Signed: By: Abigail Miyamoto M.D. On: 08/20/2020 08:39             Recent Labs: 09/29/2021: Magnesium 2.1 02/12/2022: ALT 22 03/08/2022: B Natriuretic Peptide 15.7; BUN 16; Creatinine, Ser 0.98; Hemoglobin 15.6; Platelets 231; Potassium 4.3; Sodium 136  Recent Lipid Panel No results found for: "CHOL", "TRIG", "HDL", "CHOLHDL", "VLDL",  "LDLCALC", "LDLDIRECT"   Physical Exam:    VS:  BP (!) 142/70   Pulse 66   Ht 5\' 7"  (1.702 m)   Wt 195 lb (88.5 kg)   SpO2 99%   BMI 30.54 kg/m     Wt Readings from  Last 3 Encounters:  06/11/22 195 lb (88.5 kg)  04/14/22 195 lb 6.4 oz (88.6 kg)  03/28/22 193 lb (87.5 kg)    Gen: No distress Neck: No JVD Ears: No Homero Fellers Sign Cardiac: No Rubs or Gallops, No murmur, RRR, +2 radial pulses Respiratory: Clear to auscultation bilaterally, normal effort, normal  respiratory rate GI: Soft, nontender, non-distended  MS: No  edema;  moves all extremities Integument: Skin feels warm Neuro:  At time of evaluation, alert and oriented to person/place/time/situation  Psych: Normal affect, patient feels well  ASSESSMENT:    1. Chronic heart failure with preserved ejection fraction (HCC)   2. Coronary artery disease involving native coronary artery of native heart without angina pectoris   3. Diabetes mellitus with coincident hypertension (HCC)   4. PVC's (premature ventricular contractions)     PLAN:    Moderate Non-Obstructive CAD Heart Failure with preserved EF - NYHA class I, Stage B, euvoloemic, etiology from LVH - lasix 40 mg for one week then 20 mg PO daily; BMP 1-2 weeks - Aldactone 50 mg PO daily - on lower dose SGLT2i managed by primary - ASA 81 mg PO daily - rosuvastatin 10 LDL <70 at goal  HTN with DM - on Valsartan 160 and atenolol 100 mg with controlled BP - lasix as above - needs pain control  Rare PVCs (asymptomatic) - low burden with no symptoms on   Six months with me   Medication Adjustments/Labs and Tests Ordered: Current medicines are reviewed at length with the patient today.  Concerns regarding medicines are outlined above.  Orders Placed This Encounter  Procedures   Basic metabolic panel   Meds ordered this encounter  Medications   furosemide (LASIX) 40 MG tablet    Sig: Take 1 tablet (40 mg total) by mouth daily.    Dispense:  7 tablet     Refill:  0    Patient Instructions  Medication Instructions:  Your physician has recommended you make the following change in your medication:  INCREASE: furosemide (Lasix) to 40 mg by mouth once daily for 1 week  *If you need a refill on your cardiac medications before your next appointment, please call your pharmacy*   Lab Work: IN 1-2 WEEKS: BMP  If you have labs (blood work) drawn today and your tests are completely normal, you will receive your results only by: MyChart Message (if you have MyChart) OR A paper copy in the mail If you have any lab test that is abnormal or we need to change your treatment, we will call you to review the results.   Testing/Procedures: NONE   Follow-Up: At [facility], you and your health needs are our priority.  As part of our continuing mission to provide you with exceptional heart care, we have created designated Provider Care Teams.  These Care Teams include your primary Cardiologist (physician) and Advanced Practice Providers (APPs -  Physician Assistants and Nurse Practitioners) who all work together to provide you with the care you need, when you need it.    Your next appointment:   6 month(s)  The format for your next appointment:   In Person  Provider:   Christell Constant, MD      Important Information About Sugar         Signed, Christell Constant, MD  06/11/2022 9:00 AM    [facility]

## 2022-06-11 ENCOUNTER — Encounter: Payer: Self-pay | Admitting: Internal Medicine

## 2022-06-11 ENCOUNTER — Ambulatory Visit: Payer: Medicare Other | Attending: Internal Medicine | Admitting: Internal Medicine

## 2022-06-11 ENCOUNTER — Ambulatory Visit: Payer: Medicare Other

## 2022-06-11 VITALS — BP 142/70 | HR 66 | Ht 67.0 in | Wt 195.0 lb

## 2022-06-11 DIAGNOSIS — E119 Type 2 diabetes mellitus without complications: Secondary | ICD-10-CM

## 2022-06-11 DIAGNOSIS — I2510 Atherosclerotic heart disease of native coronary artery without angina pectoris: Secondary | ICD-10-CM

## 2022-06-11 DIAGNOSIS — I10 Essential (primary) hypertension: Secondary | ICD-10-CM

## 2022-06-11 DIAGNOSIS — I493 Ventricular premature depolarization: Secondary | ICD-10-CM | POA: Diagnosis not present

## 2022-06-11 DIAGNOSIS — I5032 Chronic diastolic (congestive) heart failure: Secondary | ICD-10-CM

## 2022-06-11 MED ORDER — FUROSEMIDE 40 MG PO TABS: 40.0000 mg | ORAL_TABLET | Freq: Every day | ORAL | 0 refills | Status: DC

## 2022-06-11 NOTE — Patient Instructions (Signed)
Medication Instructions:  Your physician has recommended you make the following change in your medication:  INCREASE: furosemide (Lasix) to 40 mg by mouth once daily for 1 week  *If you need a refill on your cardiac medications before your next appointment, please call your pharmacy*   Lab Work: IN 1-2 WEEKS: BMP  If you have labs (blood work) drawn today and your tests are completely normal, you will receive your results only by: [place] (if you have MyChart) OR A paper copy in the mail If you have any lab test that is abnormal or we need to change your treatment, we will call you to review the results.   Testing/Procedures: NONE   Follow-Up: At [facility], you and your health needs are our priority.  As part of our continuing mission to provide you with exceptional heart care, we have created designated Provider Care Teams.  These Care Teams include your primary Cardiologist (physician) and Advanced Practice Providers (APPs -  Physician Assistants and Nurse Practitioners) who all work together to provide you with the care you need, when you need it.    Your next appointment:   6 month(s)  The format for your next appointment:   In Person  Provider:   Werner Lean, MD      Important Information About Sugar

## 2022-06-18 ENCOUNTER — Ambulatory Visit: Payer: Medicare Other | Attending: Internal Medicine

## 2022-06-18 DIAGNOSIS — I5032 Chronic diastolic (congestive) heart failure: Secondary | ICD-10-CM

## 2022-06-18 LAB — BASIC METABOLIC PANEL
BUN/Creatinine Ratio: 15 (ref 12–28)
BUN: 16 mg/dL (ref 8–27)
CO2: 23 mmol/L (ref 20–29)
Calcium: 10.1 mg/dL (ref 8.7–10.3)
Chloride: 102 mmol/L (ref 96–106)
Creatinine, Ser: 1.06 mg/dL — ABNORMAL HIGH (ref 0.57–1.00)
Glucose: 140 mg/dL — ABNORMAL HIGH (ref 70–99)
Potassium: 4 mmol/L (ref 3.5–5.2)
Sodium: 140 mmol/L (ref 134–144)
eGFR: 54 mL/min/{1.73_m2} — ABNORMAL LOW (ref >59)

## 2022-06-23 ENCOUNTER — Telehealth: Payer: Self-pay | Admitting: Internal Medicine

## 2022-06-23 DIAGNOSIS — M170 Bilateral primary osteoarthritis of knee: Secondary | ICD-10-CM | POA: Diagnosis not present

## 2022-06-23 DIAGNOSIS — M1711 Unilateral primary osteoarthritis, right knee: Secondary | ICD-10-CM | POA: Diagnosis not present

## 2022-06-23 DIAGNOSIS — I5032 Chronic diastolic (congestive) heart failure: Secondary | ICD-10-CM

## 2022-06-23 MED ORDER — FUROSEMIDE 40 MG PO TABS: 40.0000 mg | ORAL_TABLET | Freq: Every day | ORAL | 3 refills | Status: DC

## 2022-06-23 NOTE — Telephone Encounter (Signed)
Called pt daughter advised of MD response:  We can go back to the higher dose and get BMP f/u   Pt is agreeable will come in for lab recheck BMP on 07/10/22.

## 2022-06-23 NOTE — Telephone Encounter (Signed)
Called pt daughter Clyde Canterbury ok per DPR.  Reports pt would like to continue taking furosemide 40 mg PO QD.  Since going back down to 20 mg has noted swelling and SOB doesn't know if SOB is r/t heart or asthma.   Pt doesn't weight self daily BP today 134/80-63. Advised MD is not in the office today but will send to one of his partners to follow up.  Advised that kidney function jumped up 06/18/22(1.06) lab draw so will have to take this into consideration.  Will call pt back once have a response.

## 2022-06-23 NOTE — Telephone Encounter (Signed)
Pt c/o medication issue:  1. Name of Medication:   furosemide (LASIX) 20 MG tablet   2. How are you currently taking this medication (dosage and times per day)?  As prescribed  3. Are you having a reaction (difficulty breathing--STAT)?    No  4. What is your medication issue?   Daughter stated the patient is currently on 20 mg but had been on 40 mg for a week.  Daughter stated patient wants to back to 40 mg as she was doing better at that dosage.

## 2022-06-30 DIAGNOSIS — M1712 Unilateral primary osteoarthritis, left knee: Secondary | ICD-10-CM | POA: Diagnosis not present

## 2022-06-30 DIAGNOSIS — I10 Essential (primary) hypertension: Secondary | ICD-10-CM | POA: Diagnosis not present

## 2022-06-30 DIAGNOSIS — R600 Localized edema: Secondary | ICD-10-CM | POA: Diagnosis not present

## 2022-06-30 DIAGNOSIS — N1831 Chronic kidney disease, stage 3a: Secondary | ICD-10-CM | POA: Diagnosis not present

## 2022-07-01 ENCOUNTER — Telehealth: Payer: Self-pay | Admitting: Internal Medicine

## 2022-07-01 NOTE — Telephone Encounter (Signed)
Spoke with the patient's daughter who states that the patient did not feel well on the increased dose of Lasix (40 mg). She saw her PCP yesterday who recommended that she go back down to 20 mg daily. She will need Dr. Gasper Sells to send in a new prescription. Advised to let us know if shortness of breath or swelling worsens back on lower dose. Will send to Dr. Julieanne Manson to make sure he is okay with reduced dose.

## 2022-07-01 NOTE — Telephone Encounter (Signed)
Left message for patient's daughter to call back. 

## 2022-07-01 NOTE — Telephone Encounter (Signed)
Pt's daughter returning nurses call. Please advise 

## 2022-07-01 NOTE — Telephone Encounter (Signed)
Pt c/o medication issue:  1. Name of Medication:  furosemide (LASIX) 40 MG tablet   2. How are you currently taking this medication (dosage and times per day)?   3. Are you having a reaction (difficulty breathing--STAT)?   4. What is your medication issue? Pt daughter called in stating pt went to see her PCP yesterday. She states the pt was experiencing some SOB and overall was not feeling well. She states the pt was also nauseated and her swelling had not gone down. Pt PCP recommended she change her lasix back to 20mg  and they would like Dr. Gasper Sells to send it in

## 2022-07-02 ENCOUNTER — Other Ambulatory Visit: Payer: Self-pay | Admitting: Internal Medicine

## 2022-07-02 MED ORDER — FUROSEMIDE 20 MG PO TABS: 20.0000 mg | ORAL_TABLET | Freq: Every day | ORAL | 3 refills | Status: DC

## 2022-07-10 ENCOUNTER — Ambulatory Visit (INDEPENDENT_AMBULATORY_CARE_PROVIDER_SITE_OTHER): Payer: Medicare Other | Admitting: Pulmonary Disease

## 2022-07-10 ENCOUNTER — Ambulatory Visit: Payer: Medicare Other | Admitting: Pulmonary Disease

## 2022-07-10 ENCOUNTER — Other Ambulatory Visit: Payer: Medicare Other

## 2022-07-10 ENCOUNTER — Encounter: Payer: Self-pay | Admitting: Pulmonary Disease

## 2022-07-10 VITALS — BP 122/74 | HR 60 | Ht 67.0 in | Wt 191.0 lb

## 2022-07-10 DIAGNOSIS — J45909 Unspecified asthma, uncomplicated: Secondary | ICD-10-CM

## 2022-07-10 LAB — PULMONARY FUNCTION TEST
DL/VA % pred: 96 %
DL/VA: 3.87 ml/min/mmHg/L
DLCO cor % pred: 97 %
DLCO cor: 20.52 ml/min/mmHg
DLCO unc % pred: 97 %
DLCO unc: 20.52 ml/min/mmHg
FEF 25-75 Post: 3.03 L/sec
FEF 25-75 Pre: 2.25 L/sec
FEF2575-%Change-Post: 34 %
FEF2575-%Pred-Post: 174 %
FEF2575-%Pred-Pre: 129 %
FEV1-%Change-Post: 8 %
FEV1-%Pred-Post: 117 %
FEV1-%Pred-Pre: 108 %
FEV1-Post: 2.75 L
FEV1-Pre: 2.54 L
FEV1FVC-%Change-Post: 5 %
FEV1FVC-%Pred-Pre: 105 %
FEV6-%Change-Post: 2 %
FEV6-%Pred-Post: 111 %
FEV6-%Pred-Pre: 109 %
FEV6-Post: 3.31 L
FEV6-Pre: 3.23 L
FEV6FVC-%Change-Post: 0 %
FEV6FVC-%Pred-Post: 104 %
FEV6FVC-%Pred-Pre: 104 %
FVC-%Change-Post: 2 %
FVC-%Pred-Post: 106 %
FVC-%Pred-Pre: 104 %
FVC-Post: 3.31 L
FVC-Pre: 3.24 L
Post FEV1/FVC ratio: 83 %
Post FEV6/FVC ratio: 100 %
Pre FEV1/FVC ratio: 79 %
Pre FEV6/FVC Ratio: 100 %

## 2022-07-10 NOTE — Progress Notes (Signed)
Donna Garner    [id number]    06/16/1945  Primary Care Physician:Timberlake, Anastasia Pall, MD  Referring Physician: Glenis Smoker, MD [place],  [place] [address]  Chief complaint:   History of asthma  HPI:  Has been doing well since the last visit  Continues to use Dulera twice a day, rarely needs albuterol  No significant shortness of breath  Remains very active  History of diastolic heart failure, stable Past history of smoking, quit in 1986  Has not had any significant recent health issues   Outpatient Encounter Medications as of 07/10/2022  Medication Sig   albuterol (PROVENTIL) (2.5 MG/3ML) 0.083% nebulizer solution Take 3 mLs (2.5 mg total) by nebulization every 6 (six) hours as needed for wheezing or shortness of breath.   albuterol (VENTOLIN HFA) 108 (90 Base) MCG/ACT inhaler    amLODipine (NORVASC) 5 MG tablet Take 1 tablet by mouth daily.   aspirin EC 81 MG tablet Take 81 mg by mouth daily. Swallow whole.   atenolol (TENORMIN) 100 MG tablet TAKE 1 TABLET(100 MG) BY MOUTH DAILY   Cholecalciferol (VITAMIN D3) 50 MCG (2000 UT) TABS 1 tablet   FARXIGA 5 MG TABS tablet Take 5 mg by mouth daily.   fluticasone (FLONASE) 50 MCG/ACT nasal spray 1 spray in each nostril   furosemide (LASIX) 20 MG tablet Take 1 tablet (20 mg total) by mouth daily.   guaiFENesin (MUCINEX) 600 MG 12 hr tablet Take by mouth 2 (two) times daily as needed for cough or to loosen phlegm.   mometasone-formoterol (DULERA) 100-5 MCG/ACT AERO Inhale 2 puffs into the lungs 2 (two) times daily.   montelukast (SINGULAIR) 10 MG tablet Take 10 mg by mouth daily.   pantoprazole (PROTONIX) 40 MG tablet Take 40 mg by mouth daily.   rosuvastatin (CRESTOR) 10 MG tablet Take 10 mg by mouth daily.   spironolactone (ALDACTONE) 50 MG tablet Take 1 tablet (50 mg total) by mouth daily.   traMADol (ULTRAM) 50 MG tablet tramadol 50 mg tablet  Take 1 tablet 3 times a day by oral  route as needed for pain for 5 days.   valsartan (DIOVAN) 160 MG tablet TAKE 1 TABLET(160 MG) BY MOUTH DAILY   atenolol-chlorthalidone (TENORETIC) 100-25 MG tablet Take 1 tablet by mouth daily.   No facility-administered encounter medications on file as of 07/10/2022.    Allergies as of 07/10/2022 - Review Complete 07/10/2022  Allergen Reaction Noted   Glipizide Other (See Comments) and Rash 05/08/2020   Hydroxychloroquine Other (See Comments) and Rash 05/08/2020   Ibuprofen Shortness Of Breath and Other (See Comments) 05/08/2020   Metformin Other (See Comments) 06/28/2020   Amlodipine Swelling 08/28/2020   Dulaglutide  12/23/2021   Empagliflozin  12/23/2021   Metformin and related  05/08/2020   Saline nasal spray  05/08/2020   Sitagliptin  07/10/2022   Zyrtec [cetirizine]  05/08/2020    Past Medical History:  Diagnosis Date   Asthma    Cardiomegaly    CHF (congestive heart failure) (HCC)    Cough    Diabetes (HCC)    Dyspnea    History of swelling of feet    Hypertension    SOB (shortness of breath)    Swelling of ankle     No past surgical history on file.  Family History  Problem Relation Age of Onset   Heart disease Sister    Heart disease Brother  Social History   Socioeconomic History   Marital status: Legally Separated    Spouse name: Not on file   Number of children: Not on file   Years of education: Not on file   Highest education level: Not on file  Occupational History   Not on file  Tobacco Use   Smoking status: Former    Packs/day: 1.00    Years: 22.00    Total pack years: 22.00    Types: Cigarettes    Start date: 49    Quit date: 81    Years since quitting: 38.1   Smokeless tobacco: Never  Vaping Use   Vaping Use: Never used  Substance and Sexual Activity   Alcohol use: Never   Drug use: Never   Sexual activity: Not on file  Other Topics Concern   Not on file  Social History Narrative   Not on file   Social Determinants of  Health   Financial Resource Strain: Not on file  Food Insecurity: No Food Insecurity (03/03/2022)   Hunger Vital Sign    Worried About Running Out of Food in the Last Year: Never true    Ran Out of Food in the Last Year: Never true  Transportation Needs: No Transportation Needs (03/05/2022)   PRAPARE - Hydrologist (Medical): No    Lack of Transportation (Non-Medical): No  Physical Activity: Not on file  Stress: Not on file  Social Connections: Not on file  Intimate Partner Violence: Not on file    Review of Systems  Respiratory:  Positive for shortness of breath.     Vitals:   07/10/22 1134  BP: 122/74  Pulse: 60  SpO2: 95%     Physical Exam Constitutional:      Appearance: She is obese.  HENT:     Head: Normocephalic.     Mouth/Throat:     Mouth: Mucous membranes are moist.  Eyes:     General: No scleral icterus. Cardiovascular:     Rate and Rhythm: Normal rate and regular rhythm.     Heart sounds: No murmur heard.    No friction rub.  Pulmonary:     Effort: No respiratory distress.     Breath sounds: No stridor. No wheezing or rhonchi.  Musculoskeletal:     Cervical back: No rigidity or tenderness.  Neurological:     Mental Status: She is alert.  Psychiatric:        Mood and Affect: Mood normal.    Data Reviewed: Most recent chest x-ray reviewed showing no acute infiltrate  Last echocardiogram 06/05/2020-diastolic dysfunction, normal ejection fraction  Pulmonary function test today shows normal PFT with no obstruction, normal diffusing capacity, was unable to tolerate TLC measurement  Assessment:  Moderate persistent asthma -Symptoms controlled with Dulera -Rare use of rescue inhalers  Encouraged to continue with graded exercises as tolerated  History of diabetes-controlled  History of hypertension-controlled   Plan/Recommendations: Continue Dulera  Rescue inhaler use as needed  Graded exercises as  tolerated  No changes made to inhaler therapy  Follow-up in a year  Encouraged to give [place] a call if she has any significant concerns  Sherrilyn Rist MD [facility] 07/10/2022, 11:40 AM  CC: Glenis Smoker, *

## 2022-07-10 NOTE — Patient Instructions (Signed)
Spirometry pre/post and DLCO performed today. 

## 2022-07-10 NOTE — Patient Instructions (Signed)
Continue using your Ruthe Mannan on a regular basis  Use your rescue inhaler as needed  Graded exercise as tolerated  Avoid triggers as best as you can  Call us with significant concerns See you a year from now

## 2022-07-10 NOTE — Progress Notes (Signed)
Spirometry pre/post and DLCO performed today. Pleth portion not performed due to claustrophobia.

## 2022-08-04 DIAGNOSIS — N1831 Chronic kidney disease, stage 3a: Secondary | ICD-10-CM | POA: Diagnosis not present

## 2022-08-04 DIAGNOSIS — M129 Arthropathy, unspecified: Secondary | ICD-10-CM | POA: Diagnosis not present

## 2022-08-04 DIAGNOSIS — R61 Generalized hyperhidrosis: Secondary | ICD-10-CM | POA: Diagnosis not present

## 2022-08-04 DIAGNOSIS — J4540 Moderate persistent asthma, uncomplicated: Secondary | ICD-10-CM | POA: Diagnosis not present

## 2022-08-04 DIAGNOSIS — E1169 Type 2 diabetes mellitus with other specified complication: Secondary | ICD-10-CM | POA: Diagnosis not present

## 2022-08-04 DIAGNOSIS — E1122 Type 2 diabetes mellitus with diabetic chronic kidney disease: Secondary | ICD-10-CM | POA: Diagnosis not present

## 2022-08-04 DIAGNOSIS — I700 Atherosclerosis of aorta: Secondary | ICD-10-CM | POA: Diagnosis not present

## 2022-08-07 DIAGNOSIS — R051 Acute cough: Secondary | ICD-10-CM | POA: Diagnosis not present

## 2022-08-18 ENCOUNTER — Other Ambulatory Visit: Payer: Self-pay

## 2022-08-18 ENCOUNTER — Emergency Department (HOSPITAL_COMMUNITY)
Admission: EM | Admit: 2022-08-18 | Discharge: 2022-08-19 | Disposition: A | Discharge status: Home / Self Care | Payer: Medicare Other | Attending: Emergency Medicine | Admitting: Emergency Medicine

## 2022-08-18 ENCOUNTER — Emergency Department (HOSPITAL_COMMUNITY): Payer: Medicare Other

## 2022-08-18 ENCOUNTER — Encounter (HOSPITAL_COMMUNITY): Payer: Self-pay | Admitting: Emergency Medicine

## 2022-08-18 DIAGNOSIS — R0902 Hypoxemia: Secondary | ICD-10-CM | POA: Diagnosis not present

## 2022-08-18 DIAGNOSIS — J4521 Mild intermittent asthma with (acute) exacerbation: Secondary | ICD-10-CM | POA: Insufficient documentation

## 2022-08-18 DIAGNOSIS — I110 Hypertensive heart disease with heart failure: Secondary | ICD-10-CM | POA: Diagnosis not present

## 2022-08-18 DIAGNOSIS — K30 Functional dyspepsia: Secondary | ICD-10-CM | POA: Diagnosis not present

## 2022-08-18 DIAGNOSIS — I2510 Atherosclerotic heart disease of native coronary artery without angina pectoris: Secondary | ICD-10-CM | POA: Insufficient documentation

## 2022-08-18 DIAGNOSIS — E876 Hypokalemia: Secondary | ICD-10-CM | POA: Insufficient documentation

## 2022-08-18 DIAGNOSIS — E119 Type 2 diabetes mellitus without complications: Secondary | ICD-10-CM | POA: Insufficient documentation

## 2022-08-18 DIAGNOSIS — Z7951 Long term (current) use of inhaled steroids: Secondary | ICD-10-CM | POA: Insufficient documentation

## 2022-08-18 DIAGNOSIS — R062 Wheezing: Secondary | ICD-10-CM | POA: Diagnosis not present

## 2022-08-18 DIAGNOSIS — Z1152 Encounter for screening for COVID-19: Secondary | ICD-10-CM | POA: Diagnosis not present

## 2022-08-18 DIAGNOSIS — Z79899 Other long term (current) drug therapy: Secondary | ICD-10-CM | POA: Diagnosis not present

## 2022-08-18 DIAGNOSIS — Z87891 Personal history of nicotine dependence: Secondary | ICD-10-CM | POA: Insufficient documentation

## 2022-08-18 DIAGNOSIS — R0789 Other chest pain: Secondary | ICD-10-CM

## 2022-08-18 DIAGNOSIS — I509 Heart failure, unspecified: Secondary | ICD-10-CM | POA: Diagnosis not present

## 2022-08-18 DIAGNOSIS — J80 Acute respiratory distress syndrome: Secondary | ICD-10-CM | POA: Diagnosis not present

## 2022-08-18 DIAGNOSIS — Z7982 Long term (current) use of aspirin: Secondary | ICD-10-CM | POA: Insufficient documentation

## 2022-08-18 DIAGNOSIS — R0600 Dyspnea, unspecified: Secondary | ICD-10-CM | POA: Diagnosis not present

## 2022-08-18 DIAGNOSIS — Z0389 Encounter for observation for other suspected diseases and conditions ruled out: Secondary | ICD-10-CM | POA: Diagnosis not present

## 2022-08-18 DIAGNOSIS — R0602 Shortness of breath: Secondary | ICD-10-CM | POA: Diagnosis not present

## 2022-08-18 LAB — CBC WITH DIFFERENTIAL/PLATELET
Abs Immature Granulocytes: 0.02 10*3/uL (ref 0.00–0.07)
Basophils Absolute: 0.1 10*3/uL (ref 0.0–0.1)
Basophils Relative: 1 %
Eosinophils Absolute: 0.4 10*3/uL (ref 0.0–0.5)
Eosinophils Relative: 5 %
HCT: 44.6 % (ref 36.0–46.0)
Hemoglobin: 14.6 g/dL (ref 12.0–15.0)
Immature Granulocytes: 0 %
Lymphocytes Relative: 40 %
Lymphs Abs: 3.8 10*3/uL (ref 0.7–4.0)
MCH: 28.6 pg (ref 26.0–34.0)
MCHC: 32.7 g/dL (ref 30.0–36.0)
MCV: 87.5 fL (ref 80.0–100.0)
Monocytes Absolute: 0.8 10*3/uL (ref 0.1–1.0)
Monocytes Relative: 8 %
Neutro Abs: 4.4 10*3/uL (ref 1.7–7.7)
Neutrophils Relative %: 46 %
Platelets: 208 10*3/uL (ref 150–400)
RBC: 5.1 MIL/uL (ref 3.87–5.11)
RDW: 15.7 % — ABNORMAL HIGH (ref 11.5–15.5)
WBC: 9.5 10*3/uL (ref 4.0–10.5)
nRBC: 0 % (ref 0.0–0.2)

## 2022-08-18 LAB — BRAIN NATRIURETIC PEPTIDE: B Natriuretic Peptide: 24.3 pg/mL (ref 0.0–100.0)

## 2022-08-18 LAB — RESP PANEL BY RT-PCR (RSV, FLU A&B, COVID)  RVPGX2
Influenza A by PCR: NEGATIVE
Influenza B by PCR: NEGATIVE
Resp Syncytial Virus by PCR: NEGATIVE
SARS Coronavirus 2 by RT PCR: NEGATIVE

## 2022-08-18 LAB — BASIC METABOLIC PANEL
Anion gap: 11 (ref 5–15)
BUN: 13 mg/dL (ref 8–23)
CO2: 21 mmol/L — ABNORMAL LOW (ref 22–32)
Calcium: 9.4 mg/dL (ref 8.9–10.3)
Chloride: 105 mmol/L (ref 98–111)
Creatinine, Ser: 0.93 mg/dL (ref 0.44–1.00)
GFR, Estimated: >60 mL/min (ref >60)
Glucose, Bld: 208 mg/dL — ABNORMAL HIGH (ref 70–99)
Potassium: 3.1 mmol/L — ABNORMAL LOW (ref 3.5–5.1)
Sodium: 137 mmol/L (ref 135–145)

## 2022-08-18 LAB — BLOOD GAS, VENOUS
Acid-Base Excess: 3.9 mmol/L — ABNORMAL HIGH (ref 0.0–2.0)
Bicarbonate: 27.7 mmol/L (ref 20.0–28.0)
O2 Saturation: 40.7 %
Patient temperature: 37
pCO2, Ven: 38 mmHg — ABNORMAL LOW (ref 44–60)
pH, Ven: 7.47 — ABNORMAL HIGH (ref 7.25–7.43)
pO2, Ven: <31 mmHg — CL (ref 32–45)

## 2022-08-18 LAB — TROPONIN I (HIGH SENSITIVITY)
Troponin I (High Sensitivity): <2 ng/L (ref <18)
Troponin I (High Sensitivity): <2 ng/L (ref <18)

## 2022-08-18 LAB — MAGNESIUM: Magnesium: 2.3 mg/dL (ref 1.7–2.4)

## 2022-08-18 MED ORDER — ONDANSETRON HCL 4 MG/2ML IJ SOLN
4.0000 mg | Freq: Once | INTRAMUSCULAR | Status: DC
  Filled 2022-08-18: qty 2

## 2022-08-18 MED ORDER — SODIUM CHLORIDE 0.9 % IV BOLUS
500.0000 mL | Freq: Once | INTRAVENOUS | Status: AC
  Administered 2022-08-18: 500 mL via INTRAVENOUS

## 2022-08-18 MED ORDER — METHYLPREDNISOLONE SODIUM SUCC 125 MG IJ SOLR
62.5000 mg | Freq: Once | INTRAMUSCULAR | Status: AC
  Administered 2022-08-18: 62.5 mg via INTRAVENOUS
  Filled 2022-08-18: qty 2

## 2022-08-18 MED ORDER — ALUM & MAG HYDROXIDE-SIMETH 200-200-20 MG/5ML PO SUSP
30.0000 mL | Freq: Once | ORAL | Status: DC
  Filled 2022-08-18: qty 30

## 2022-08-18 MED ORDER — MAGNESIUM SULFATE 2 GM/50ML IV SOLN
2.0000 g | Freq: Once | INTRAVENOUS | Status: AC
  Administered 2022-08-18: 2 g via INTRAVENOUS
  Filled 2022-08-18: qty 50

## 2022-08-18 MED ORDER — LIDOCAINE VISCOUS HCL 2 % MT SOLN
15.0000 mL | Freq: Once | OROMUCOSAL | Status: DC
  Filled 2022-08-18: qty 15

## 2022-08-18 MED ORDER — HYDROMORPHONE HCL 1 MG/ML IJ SOLN
0.5000 mg | Freq: Once | INTRAMUSCULAR | Status: DC
  Filled 2022-08-18: qty 1

## 2022-08-18 MED ORDER — FAMOTIDINE IN NACL 20-0.9 MG/50ML-% IV SOLN
20.0000 mg | Freq: Once | INTRAVENOUS | Status: AC
  Administered 2022-08-18: 20 mg via INTRAVENOUS
  Filled 2022-08-18: qty 50

## 2022-08-18 MED ORDER — IPRATROPIUM-ALBUTEROL 0.5-2.5 (3) MG/3ML IN SOLN
3.0000 mL | Freq: Once | RESPIRATORY_TRACT | Status: AC
  Administered 2022-08-18: 3 mL via RESPIRATORY_TRACT
  Filled 2022-08-18: qty 3

## 2022-08-18 NOTE — ED Provider Notes (Incomplete)
[facility] Provider Note  CSN: [id number] Arrival date & time: 08/18/22 1753  Chief Complaint(s) Shortness of Breath  HPI Donna Garner is a 77 y.o. female with past medical history as below, significant for asthma, CHF, DM, HTN, CAD who presents to the ED with complaint of DIB.  Patient reports over the past week she has had intermittent fevers and chills, productive cough with clear/white sputum.  She has been compliant with her home medications including as needed DuoNeb.  Breathing worsened over the past 24 hours.  Denies any sick contacts or recent travel.  Compliant with home medications.  Quit smoking over 20 years ago.  No home oxygen use.  On EMS arrival patient with tachypnea pulse ox low 90s.  Wheezing noted on exam per EMS.  Patient given DuoNeb, mild improvement to her breathing continued increased work of breathing, arrival.  Unable to obtain IV access en route  Past Medical History Past Medical History:  Diagnosis Date  . Asthma   . Cardiomegaly   . CHF (congestive heart failure) ([place])   . Cough   . Diabetes ([place])   . Dyspnea   . History of swelling of feet   . Hypertension   . SOB (shortness of breath)   . Swelling of ankle    Patient Active Problem List   Diagnosis Date Noted  . Coronary artery disease involving native coronary artery of native heart without angina pectoris 12/30/2021  . Chronic heart failure with preserved ejection fraction ([place]) 05/08/2020  . Diabetes mellitus with coincident hypertension ([place]) 05/08/2020   Home Medication(s) Prior to Admission medications   Medication Sig Start Date End Date Taking? Authorizing Provider  albuterol (PROVENTIL) (2.5 MG/3ML) 0.083% nebulizer solution Take 3 mLs (2.5 mg total) by nebulization every 6 (six) hours as needed for wheezing or shortness of breath. 02/12/22   Kommor, [place], MD  albuterol (VENTOLIN HFA) 108 (90 Base) MCG/ACT inhaler  04/05/21   [provider]  amLODipine (NORVASC) 5 MG tablet Take 1 tablet by mouth daily.    [provider]  aspirin EC 81 MG tablet Take 81 mg by mouth daily. Swallow whole.    [provider]  atenolol (TENORMIN) 100 MG tablet TAKE 1 TABLET(100 MG) BY MOUTH DAILY 07/03/22   Chandrasekhar, Mahesh A, MD  atenolol-chlorthalidone (TENORETIC) 100-25 MG tablet Take 1 tablet by mouth daily.    [provider]  Cholecalciferol (VITAMIN D3) 50 MCG (2000 UT) TABS 1 tablet    [provider]  FARXIGA 5 MG TABS tablet Take 5 mg by mouth daily. 03/09/22   [provider]  fluticasone Asencion Islam) 50 MCG/ACT nasal spray 1 spray in each nostril 11/07/20   [provider]  furosemide (LASIX) 20 MG tablet Take 1 tablet (20 mg total) by mouth daily. 07/02/22   Chandrasekhar, Terisa Starr, MD  guaiFENesin (MUCINEX) 600 MG 12 hr tablet Take by mouth 2 (two) times daily as needed for cough or to loosen phlegm.    [provider]  mometasone-formoterol (DULERA) 100-5 MCG/ACT AERO Inhale 2 puffs into the lungs 2 (two) times daily.    [provider]  montelukast (SINGULAIR) 10 MG tablet Take 10 mg by mouth daily. 04/12/20   [provider]  pantoprazole (PROTONIX) 40 MG tablet Take 40 mg by mouth daily.    [provider]  rosuvastatin (CRESTOR) 10 MG tablet Take 10 mg by mouth daily.    [provider]  spironolactone (ALDACTONE) 50 MG tablet Take 1 tablet (50 mg total) by mouth daily. 07/04/21   Chandrasekhar, Terisa Starr, MD  traMADol (ULTRAM) 50 MG tablet tramadol 50 mg tablet  Take 1 tablet 3 times a day by oral route as needed for pain for 5 days.    [provider]  valsartan (DIOVAN) 160 MG tablet TAKE 1 TABLET(160 MG) BY MOUTH DAILY 04/28/22   Werner Lean, MD                                                                                                                                    Past Surgical History History  reviewed. No pertinent surgical history. Family History Family History  Problem Relation Age of Onset  . Heart disease Sister   . Heart disease Brother     Social History Social History   Tobacco Use  . Smoking status: Former    Packs/day: 1.00    Years: 22.00    Total pack years: 22.00    Types: Cigarettes    Start date: 1964    Quit date: 1986    Years since quitting: 38.2  . Smokeless tobacco: Never  Vaping Use  . Vaping Use: Never used  Substance Use Topics  . Alcohol use: Never  . Drug use: Never   Allergies Glipizide, Hydroxychloroquine, Ibuprofen, Metformin, Amlodipine, Dulaglutide, Empagliflozin, Metformin and related, Saline nasal spray, Sitagliptin, and Zyrtec [cetirizine]  Review of Systems Review of Systems  Constitutional:  Positive for fatigue. Negative for activity change and fever.  HENT:  Negative for facial swelling and trouble swallowing.   Eyes:  Negative for discharge and redness.  Respiratory:  Positive for cough and shortness of breath.   Cardiovascular:  Negative for chest pain and palpitations.  Gastrointestinal:  Negative for abdominal pain and nausea.  Genitourinary:  Negative for dysuria and flank pain.  Musculoskeletal:  Negative for back pain and gait problem.  Skin:  Negative for pallor and rash.  Neurological:  Negative for syncope and headaches.    Physical Exam Vital Signs  I have reviewed the triage vital signs There were no vitals taken for this visit. Physical Exam Vitals and nursing note reviewed.  Constitutional:      General: She is not in acute distress.    Appearance: Normal appearance. She is well-developed. She is ill-appearing.  HENT:     Head: Normocephalic and atraumatic.     Right Ear: External ear normal.     Left Ear: External ear normal.     Nose: Nose normal.     Mouth/Throat:     Mouth: Mucous membranes are moist.  Eyes:     General: No scleral icterus.       Right eye: No discharge.        Left eye:  No discharge.  Cardiovascular:     Rate and Rhythm: Normal rate and regular rhythm.  Pulses: Normal pulses.     Heart sounds: Normal heart sounds.  Pulmonary:     Effort: Tachypnea and accessory muscle usage present. No respiratory distress.     Breath sounds: Decreased breath sounds and wheezing present.  Abdominal:     General: Abdomen is flat.     Tenderness: There is no abdominal tenderness. There is no guarding or rebound.  Musculoskeletal:        General: Normal range of motion.     Cervical back: Normal range of motion.     Right lower leg: No edema.     Left lower leg: No edema.  Skin:    General: Skin is warm and dry.     Capillary Refill: Capillary refill takes less than 2 seconds.  Neurological:     Mental Status: She is alert and oriented to person, place, and time.     GCS: GCS eye subscore is 4. GCS verbal subscore is 5. GCS motor subscore is 6.  Psychiatric:        Mood and Affect: Mood normal.        Behavior: Behavior normal.     ED Results and Treatments Labs (all labs ordered are listed, but only abnormal results are displayed) Labs Reviewed  RESP PANEL BY RT-PCR (RSV, FLU A&B, COVID)  RVPGX2  BASIC METABOLIC PANEL  BRAIN NATRIURETIC PEPTIDE  CBC WITH DIFFERENTIAL/PLATELET  BLOOD GAS, VENOUS  MAGNESIUM  TROPONIN I (HIGH SENSITIVITY)                                                                                                                          Radiology No results found.  Pertinent labs & imaging results that were available during my care of the patient were reviewed by me and considered in my medical decision making (see MDM for details).  Medications Ordered in ED Medications  magnesium sulfate IVPB 2 g 50 mL (has no administration in time range)  methylPREDNISolone sodium succinate (SOLU-MEDROL) 125 mg/2 mL injection 62.5 mg (has no administration in time range)  ipratropium-albuterol (DUONEB) 0.5-2.5 (3) MG/3ML nebulizer solution 3  mL (has no administration in time range)                                                                                                                                     Procedures Procedures  (including critical care time)  Medical  Decision Making / ED Course    Medical Decision Making:    JOBETH FURTAW is a 77 y.o. female with past medical history as below, significant for asthma, Paff, DM, HTN, CAD who presents to the ED with complaint of DIB. Marland Kitchen The complaint involves an extensive differential diagnosis and also carries with it a high risk of complications and morbidity.  Serious etiology was considered. Ddx includes but is not limited to: In my evaluation of this patient's dyspnea my DDx includes, but is not limited to, pneumonia, pulmonary embolism, pneumothorax, pulmonary edema, metabolic acidosis, asthma, COPD, cardiac cause, anemia, anxiety, etc.    Complete initial physical exam performed, notably the patient  was tachypnea noted, fortunately she is not hypoxic.  She has conversational dyspnea.  Will give DuoNeb, mag sulfate, steroids.  Collect screening labs and x-ray, recheck    Reviewed and confirmed nursing documentation for past medical history, family history, social history.  Vital signs reviewed.    Clinical Course as of 08/18/22 2344  Mon Aug 18, 2022  1842 Pt now having CP, her wob is improving. She has NSAID allergy. Give IVF and analgesia. EKG wnl [SG]  1950 Pt refused analgesia.  [SG]    Clinical Course User Index [SG] Jeanell Sparrow, DO   Patient feels back to baseline.  She is breathing comfortably on ambient air.  Not hypoxic.  No conversational dyspnea.  Epigastric discomfort has resolved.  Favor atypical chest pain in conjunction with asthma exacerbation.  Does not need refills of her bronchodilators for home.  Will give short course of oral steroids.  Follow-up with PCP.  PPI for home  The patient's chest pain is not suggestive of pulmonary embolus,  cardiac ischemia, aortic dissection, pericarditis, myocarditis, pulmonary embolism, pneumothorax, pneumonia, Zoster, or esophageal perforation, or other serious etiology.  Historically not abrupt in onset, tearing or ripping, pulses symmetric. EKG nonspecific for ischemia/infarction. No dysrhythmias, brugada, WPW, prolonged QT noted.   Troponin negative x2. CXR reviewed. Labs without demonstration of acute pathology unless otherwise noted above. Low HEART Score  Given the extremely low risk of these diagnoses further testing and evaluation for these possibilities does not appear to be indicated at this time. Patient in no distress and overall condition improved here in the ED. Detailed discussions were had with the patient regarding current findings, and need for close f/u with PCP or on call doctor. The patient has been instructed to return immediately if the symptoms worsen in any way for re-evaluation. Patient verbalized understanding and is in agreement with current care plan. All questions answered prior to discharge.   Additional history obtained: -Additional history obtained from ems -External records from outside source obtained and reviewed including: Chart review including previous notes, labs, imaging, consultation notes including primary care documentation, prior ED visits, medications, prior labs and imaging   Lab Tests: -I ordered, reviewed, and interpreted labs.   The pertinent results include:   Labs Reviewed  RESP PANEL BY RT-PCR (RSV, FLU A&B, COVID)  RVPGX2  BASIC METABOLIC PANEL  BRAIN NATRIURETIC PEPTIDE  CBC WITH DIFFERENTIAL/PLATELET  BLOOD GAS, VENOUS  MAGNESIUM  TROPONIN I (HIGH SENSITIVITY)    Notable for as above  EKG   EKG Interpretation  Date/Time:    Ventricular Rate:    PR Interval:    QRS Duration:   QT Interval:    QTC Calculation:   R Axis:     Text Interpretation:           Imaging  Studies ordered: I ordered imaging studies including  CXR I independently visualized the following imaging with scope of interpretation limited to determining acute life threatening conditions related to emergency care: stable I independently visualized and interpreted imaging. I agree with the radiologist interpretation   Medicines ordered and prescription drug management: Meds ordered this encounter  Medications  . magnesium sulfate IVPB 2 g 50 mL  . methylPREDNISolone sodium succinate (SOLU-MEDROL) 125 mg/2 mL injection 62.5 mg  . ipratropium-albuterol (DUONEB) 0.5-2.5 (3) MG/3ML nebulizer solution 3 mL    -I have reviewed the patients home medicines and have made adjustments as needed   Consultations Obtained: na   Cardiac Monitoring: The patient was maintained on a cardiac monitor.  I personally viewed and interpreted the cardiac monitored which showed an underlying rhythm of: sinus tachy  Social Determinants of Health:  Diagnosis or treatment significantly limited by social determinants of health: former smoker   Reevaluation: After the interventions noted above, I reevaluated the patient and found that they have resolved  Co morbidities that complicate the patient evaluation . Past Medical History:  Diagnosis Date  . Asthma   . Cardiomegaly   . CHF (congestive heart failure) ([place])   . Cough   . Diabetes ([place])   . Dyspnea   . History of swelling of feet   . Hypertension   . SOB (shortness of breath)   . Swelling of ankle       Dispostion: Disposition decision including need for hospitalization was considered, and patient discharged from emergency department.    Final Clinical Impression(s) / ED Diagnoses Final diagnoses:  None     This chart was dictated using voice recognition software.  Despite best efforts to proofread,  errors can occur which can change the documentation meaning.

## 2022-08-18 NOTE — ED Notes (Signed)
Dr. Pearline Cables notified of PO2 less than 31

## 2022-08-18 NOTE — ED Provider Notes (Signed)
[facility] Provider Note  CSN: [id number] Arrival date & time: 08/18/22 1753  Chief Complaint(s) Shortness of Breath  HPI Donna Garner is a 77 y.o. female with past medical history as below, significant for asthma, CHF, DM, HTN, CAD who presents to the ED with complaint of DIB.  Patient reports over the past week she has had intermittent fevers and chills, productive cough with clear/white sputum.  She has been compliant with her home medications including as needed DuoNeb.  Breathing worsened over the past 24 hours.  Denies any sick contacts or recent travel.  Compliant with home medications.  Quit smoking over 20 years ago.  No home oxygen use.  On EMS arrival patient with tachypnea pulse ox low 90s.  Wheezing noted on exam per EMS.  Patient given DuoNeb, mild improvement to her breathing continued increased work of breathing, arrival.  Unable to obtain IV access en route  Past Medical History Past Medical History:  Diagnosis Date   Asthma    Cardiomegaly    CHF (congestive heart failure) (HCC)    Cough    Diabetes (HCC)    Dyspnea    History of swelling of feet    Hypertension    SOB (shortness of breath)    Swelling of ankle    Patient Active Problem List   Diagnosis Date Noted   Coronary artery disease involving native coronary artery of native heart without angina pectoris 12/30/2021   Chronic heart failure with preserved ejection fraction ([place]) 05/08/2020   Diabetes mellitus with coincident hypertension ([place]) 05/08/2020   Home Medication(s) Prior to Admission medications   Medication Sig Start Date End Date Taking? Authorizing Provider  predniSONE (STERAPRED UNI-PAK 21 TAB) 10 MG (21) TBPK tablet Take by mouth daily. Take 6 tabs by mouth daily  for 2 days, then 5 tabs for 2 days, then 4 tabs for 2 days, then 3 tabs for 2 days, 2 tabs for 2 days, then 1 tab by mouth daily for 2 days 08/18/22  Yes Wynona Dove A, DO  sucralfate  (CARAFATE) 1 g tablet Take 1 tablet (1 g total) by mouth with breakfast, with lunch, and with evening meal for 7 days. 08/19/22 08/26/22 Yes Wynona Dove A, DO  albuterol (PROVENTIL) (2.5 MG/3ML) 0.083% nebulizer solution Take 3 mLs (2.5 mg total) by nebulization every 6 (six) hours as needed for wheezing or shortness of breath. 02/12/22   Kommor, Madison, MD  albuterol (VENTOLIN HFA) 108 (90 Base) MCG/ACT inhaler  04/05/21   [provider]  amLODipine (NORVASC) 5 MG tablet Take 1 tablet by mouth daily.    [provider]  aspirin EC 81 MG tablet Take 81 mg by mouth daily. Swallow whole.    [provider]  atenolol (TENORMIN) 100 MG tablet TAKE 1 TABLET(100 MG) BY MOUTH DAILY 07/03/22   Chandrasekhar, Mahesh A, MD  atenolol-chlorthalidone (TENORETIC) 100-25 MG tablet Take 1 tablet by mouth daily.    [provider]  Cholecalciferol (VITAMIN D3) 50 MCG (2000 UT) TABS 1 tablet    [provider]  FARXIGA 5 MG TABS tablet Take 5 mg by mouth daily. 03/09/22   [provider]  fluticasone Asencion Islam) 50 MCG/ACT nasal spray 1 spray in each nostril 11/07/20   [provider]  furosemide (LASIX) 20 MG tablet Take 1 tablet (20 mg total) by mouth daily. 07/02/22   Chandrasekhar, Lyda Kalata A, MD  guaiFENesin (MUCINEX) 600 MG 12 hr tablet Take  by mouth 2 (two) times daily as needed for cough or to loosen phlegm.    [provider]  mometasone-formoterol (DULERA) 100-5 MCG/ACT AERO Inhale 2 puffs into the lungs 2 (two) times daily.    [provider]  montelukast (SINGULAIR) 10 MG tablet Take 10 mg by mouth daily. 04/12/20   [provider]  pantoprazole (PROTONIX) 40 MG tablet Take 40 mg by mouth daily.    [provider]  rosuvastatin (CRESTOR) 10 MG tablet Take 10 mg by mouth daily.    [provider]  spironolactone (ALDACTONE) 50 MG tablet Take 1 tablet (50 mg total) by mouth daily. 07/04/21   Chandrasekhar, Terisa Starr, MD  traMADol (ULTRAM) 50 MG tablet tramadol 50 mg tablet  Take 1 tablet 3 times a day by oral route as needed for pain for 5 days.    [provider]  valsartan (DIOVAN) 160 MG tablet TAKE 1 TABLET(160 MG) BY MOUTH DAILY 04/28/22   Werner Lean, MD                                                                                                                                    Past Surgical History History reviewed. No pertinent surgical history. Family History Family History  Problem Relation Age of Onset   Heart disease Sister    Heart disease Brother     Social History Social History   Tobacco Use   Smoking status: Former    Packs/day: 1.00    Years: 22.00    Total pack years: 22.00    Types: Cigarettes    Start date: 1964    Quit date: 1986    Years since quitting: 38.2   Smokeless tobacco: Never  Vaping Use   Vaping Use: Never used  Substance Use Topics   Alcohol use: Never   Drug use: Never   Allergies Glipizide, Hydroxychloroquine, Ibuprofen, Metformin, Amlodipine, Dulaglutide, Empagliflozin, Metformin and related, Saline nasal spray, Sitagliptin, and Zyrtec [cetirizine]  Review of Systems Review of Systems  Constitutional:  Positive for fatigue. Negative for activity change and fever.  HENT:  Negative for facial swelling and trouble swallowing.   Eyes:  Negative for discharge and redness.  Respiratory:  Positive for cough and shortness of breath.   Cardiovascular:  Negative for chest pain and palpitations.  Gastrointestinal:  Negative for abdominal pain and nausea.  Genitourinary:  Negative for dysuria and flank pain.  Musculoskeletal:  Negative for back pain and gait problem.  Skin:  Negative for pallor and rash.  Neurological:  Negative for syncope and headaches.    Physical Exam Vital Signs  I have reviewed the triage vital signs BP 128/83   Pulse 85   Temp 98.3 F (36.8 C)   Resp 18   SpO2 98%  Physical Exam Vitals and  nursing note reviewed.  Constitutional:      General: She is not in acute  distress.    Appearance: Normal appearance. She is well-developed. She is ill-appearing.  HENT:     Head: Normocephalic and atraumatic.     Right Ear: External ear normal.     Left Ear: External ear normal.     Nose: Nose normal.     Mouth/Throat:     Mouth: Mucous membranes are moist.  Eyes:     General: No scleral icterus.       Right eye: No discharge.        Left eye: No discharge.  Cardiovascular:     Rate and Rhythm: Normal rate and regular rhythm.     Pulses: Normal pulses.     Heart sounds: Normal heart sounds.  Pulmonary:     Effort: Tachypnea and accessory muscle usage present. No respiratory distress.     Breath sounds: Decreased breath sounds and wheezing present.  Abdominal:     General: Abdomen is flat.     Tenderness: There is no abdominal tenderness. There is no guarding or rebound.  Musculoskeletal:        General: Normal range of motion.     Cervical back: Normal range of motion.     Right lower leg: No edema.     Left lower leg: No edema.  Skin:    General: Skin is warm and dry.     Capillary Refill: Capillary refill takes less than 2 seconds.  Neurological:     Mental Status: She is alert and oriented to person, place, and time.     GCS: GCS eye subscore is 4. GCS verbal subscore is 5. GCS motor subscore is 6.  Psychiatric:        Mood and Affect: Mood normal.        Behavior: Behavior normal.     ED Results and Treatments Labs (all labs ordered are listed, but only abnormal results are displayed) Labs Reviewed  BASIC METABOLIC PANEL - Abnormal; Notable for the following components:      Result Value   Potassium 3.1 (*)    CO2 21 (*)    Glucose, Bld 208 (*)    All other components within normal limits  CBC WITH DIFFERENTIAL/PLATELET - Abnormal; Notable for the following components:   RDW 15.7 (*)    All other components within normal limits  BLOOD GAS, VENOUS -  Abnormal; Notable for the following components:   pH, Ven 7.47 (*)    pCO2, Ven 38 (*)    pO2, Ven <31 (*)    Acid-Base Excess 3.9 (*)    All other components within normal limits  RESP PANEL BY RT-PCR (RSV, FLU A&B, COVID)  RVPGX2  BRAIN NATRIURETIC PEPTIDE  MAGNESIUM  TROPONIN I (HIGH SENSITIVITY)  TROPONIN I (HIGH SENSITIVITY)                                                                                                                          Radiology DG Chest [facility] 1 [address]  Result Date: 08/18/2022 CLINICAL DATA:  dib EXAM: PORTABLE CHEST 1 VIEW.  Patient is slightly rotated COMPARISON:  Chest x-ray 08/07/2022 FINDINGS: The heart and mediastinal contours are unchanged. No focal consolidation. No pulmonary edema. No pleural effusion. No pneumothorax. No acute osseous abnormality. IMPRESSION: No active disease. Electronically Signed   By: Iven Finn M.D.   On: 08/18/2022 19:21    Pertinent labs & imaging results that were available during my care of the patient were reviewed by me and considered in my medical decision making (see MDM for details).  Medications Ordered in ED Medications  HYDROmorphone (DILAUDID) injection 0.5 mg (0.5 mg Intravenous Not Given 08/18/22 1856)  ondansetron (ZOFRAN) injection 4 mg (4 mg Intravenous Not Given 08/18/22 1855)  alum & mag hydroxide-simeth (MAALOX/MYLANTA) 200-200-20 MG/5ML suspension 30 mL (30 mLs Oral Patient Refused/Not Given 08/18/22 2349)    And  lidocaine (XYLOCAINE) 2 % viscous mouth solution 15 mL (15 mLs Oral Patient Refused/Not Given 08/18/22 2349)  potassium chloride SA (KLOR-CON M) CR tablet 40 mEq (has no administration in time range)  magnesium sulfate IVPB 2 g 50 mL (0 g Intravenous Stopped 08/18/22 2000)  methylPREDNISolone sodium succinate (SOLU-MEDROL) 125 mg/2 mL injection 62.5 mg (62.5 mg Intravenous Given 08/18/22 1816)  ipratropium-albuterol (DUONEB) 0.5-2.5 (3) MG/3ML nebulizer solution 3 mL (3 mLs Nebulization Given  08/18/22 1815)  sodium chloride 0.9 % bolus 500 mL (0 mLs Intravenous Stopped 08/18/22 2349)  famotidine (PEPCID) IVPB 20 mg premix (0 mg Intravenous Stopped 08/18/22 2349)                                                                                                                                     Procedures Procedures  (including critical care time)  Medical Decision Making / ED Course    Medical Decision Making:    ELZIE WAHLEN is a 77 y.o. female with past medical history as below, significant for asthma, Paff, DM, HTN, CAD who presents to the ED with complaint of DIB. Marland Kitchen The complaint involves an extensive differential diagnosis and also carries with it a high risk of complications and morbidity.  Serious etiology was considered. Ddx includes but is not limited to: In my evaluation of this patient's dyspnea my DDx includes, but is not limited to, pneumonia, pulmonary embolism, pneumothorax, pulmonary edema, metabolic acidosis, asthma, COPD, cardiac cause, anemia, anxiety, etc.    Complete initial physical exam performed, notably the patient  was tachypnea noted, fortunately she is not hypoxic.  She has conversational dyspnea.  Will give DuoNeb, mag sulfate, steroids.  Collect screening labs and x-ray, recheck    Reviewed and confirmed nursing documentation for past medical history, family history, social history.  Vital signs reviewed.    Clinical Course as of 08/19/22 0011  Mon Aug 18, 2022  1842 Pt now having CP, her wob is improving. She has NSAID allergy. Give IVF and analgesia. EKG wnl [SG]  1950 Pt refused analgesia.  [SG]    Clinical Course User Index [SG] Jeanell Sparrow, DO   K low, replaced orally  Refused gi cocktail other than pepcid, which did improve her discomfort  Patient feels back to baseline.  She is breathing comfortably on ambient air.  Not hypoxic.  No conversational dyspnea.  Epigastric discomfort has resolved.  Favor atypical chest pain in conjunction  with asthma exacerbation.  Does not need refills of her bronchodilators for home.  Will give short course of oral steroids.  Follow-up with PCP.  PPI for home, bland diet, avoid known triggers.   The patient's chest pain is not suggestive of pulmonary embolus, cardiac ischemia, aortic dissection, pericarditis, myocarditis, pulmonary embolism, pneumothorax, pneumonia, Zoster, or esophageal perforation, or other serious etiology.  Historically not abrupt in onset, tearing or ripping, pulses symmetric. EKG nonspecific for ischemia/infarction. No dysrhythmias, brugada, WPW, prolonged QT noted.   Troponin negative x2. CXR reviewed. Labs without demonstration of acute pathology unless otherwise noted above. Low HEART Score  Given the extremely low risk of these diagnoses further testing and evaluation for these possibilities does not appear to be indicated at this time. Patient in no distress and overall condition improved here in the ED. Detailed discussions were had with the patient regarding current findings, and need for close f/u with PCP or on call doctor. The patient has been instructed to return immediately if the symptoms worsen in any way for re-evaluation. Patient verbalized understanding and is in agreement with current care plan. All questions answered prior to discharge.   Additional history obtained: -Additional history obtained from ems -External records from outside source obtained and reviewed including: Chart review including previous notes, labs, imaging, consultation notes including primary care documentation, prior ED visits, medications, prior labs and imaging   Lab Tests: -I ordered, reviewed, and interpreted labs.   The pertinent results include:   Labs Reviewed  BASIC METABOLIC PANEL - Abnormal; Notable for the following components:      Result Value   Potassium 3.1 (*)    CO2 21 (*)    Glucose, Bld 208 (*)    All other components within normal limits  CBC WITH  DIFFERENTIAL/PLATELET - Abnormal; Notable for the following components:   RDW 15.7 (*)    All other components within normal limits  BLOOD GAS, VENOUS - Abnormal; Notable for the following components:   pH, Ven 7.47 (*)    pCO2, Ven 38 (*)    pO2, Ven <31 (*)    Acid-Base Excess 3.9 (*)    All other components within normal limits  RESP PANEL BY RT-PCR (RSV, FLU A&B, COVID)  RVPGX2  BRAIN NATRIURETIC PEPTIDE  MAGNESIUM  TROPONIN I (HIGH SENSITIVITY)  TROPONIN I (HIGH SENSITIVITY)    Notable for as above  EKG   EKG Interpretation  Date/Time:  Monday August 18 2022 18:04:32 EDT Ventricular Rate:  69 PR Interval:  60 QRS Duration: 97 QT Interval:  426 QTC Calculation: 457 R Axis:   -49 Text Interpretation: Sinus rhythm Short PR interval Left anterior fascicular block Abnormal R-wave progression, early transition similar to prior no stemi Interpretation limited secondary to artifact Confirmed by Wynona Dove ([id number]) on 08/18/2022 6:41:24 PM         Imaging Studies ordered: I ordered imaging studies including CXR I independently visualized the following imaging with scope of interpretation limited to determining acute life threatening conditions related to emergency care: stable I independently visualized and interpreted imaging. I agree  with the radiologist interpretation   Medicines ordered and prescription drug management: Meds ordered this encounter  Medications   magnesium sulfate IVPB 2 g 50 mL   methylPREDNISolone sodium succinate (SOLU-MEDROL) 125 mg/2 mL injection 62.5 mg   ipratropium-albuterol (DUONEB) 0.5-2.5 (3) MG/3ML nebulizer solution 3 mL   HYDROmorphone (DILAUDID) injection 0.5 mg   ondansetron (ZOFRAN) injection 4 mg   sodium chloride 0.9 % bolus 500 mL   AND Linked Order Group    alum & mag hydroxide-simeth (MAALOX/MYLANTA) 200-200-20 MG/5ML suspension 30 mL    lidocaine (XYLOCAINE) 2 % viscous mouth solution 15 mL   famotidine (PEPCID) IVPB 20 mg  premix   predniSONE (STERAPRED UNI-PAK 21 TAB) 10 MG (21) TBPK tablet    Sig: Take by mouth daily. Take 6 tabs by mouth daily  for 2 days, then 5 tabs for 2 days, then 4 tabs for 2 days, then 3 tabs for 2 days, 2 tabs for 2 days, then 1 tab by mouth daily for 2 days    Dispense:  42 tablet    Refill:  0   sucralfate (CARAFATE) 1 g tablet    Sig: Take 1 tablet (1 g total) by mouth with breakfast, with lunch, and with evening meal for 7 days.    Dispense:  21 tablet    Refill:  0   potassium chloride SA (KLOR-CON M) CR tablet 40 mEq    -I have reviewed the patients home medicines and have made adjustments as needed   Consultations Obtained: na   Cardiac Monitoring: The patient was maintained on a cardiac monitor.  I personally viewed and interpreted the cardiac monitored which showed an underlying rhythm of: sinus tachy  Social Determinants of Health:  Diagnosis or treatment significantly limited by social determinants of health: former smoker   Reevaluation: After the interventions noted above, I reevaluated the patient and found that they have resolved  Co morbidities that complicate the patient evaluation  Past Medical History:  Diagnosis Date   Asthma    Cardiomegaly    CHF (congestive heart failure) (HCC)    Cough    Diabetes (HCC)    Dyspnea    History of swelling of feet    Hypertension    SOB (shortness of breath)    Swelling of ankle       Dispostion: Disposition decision including need for hospitalization was considered, and patient discharged from emergency department.    Final Clinical Impression(s) / ED Diagnoses Final diagnoses:  Mild intermittent asthma with exacerbation  Atypical chest pain  Indigestion  Hypokalemia     This chart was dictated using voice recognition software.  Despite best efforts to proofread,  errors can occur which can change the documentation meaning.    Jeanell Sparrow, DO 08/19/22 0011

## 2022-08-19 ENCOUNTER — Other Ambulatory Visit: Payer: Self-pay | Admitting: Family Medicine

## 2022-08-19 DIAGNOSIS — M25811 Other specified joint disorders, right shoulder: Secondary | ICD-10-CM

## 2022-08-19 MED ORDER — SUCRALFATE 1 G PO TABS: 1.0000 g | ORAL_TABLET | Freq: Three times a day (TID) | ORAL | 0 refills | Status: DC

## 2022-08-19 MED ORDER — PREDNISONE 10 MG (21) PO TBPK: ORAL_TABLET | Freq: Every day | ORAL | 0 refills | Status: DC

## 2022-08-19 MED ORDER — POTASSIUM CHLORIDE CRYS ER 20 MEQ PO TBCR
40.0000 meq | EXTENDED_RELEASE_TABLET | Freq: Once | ORAL | Status: AC
  Administered 2022-08-19: 40 meq via ORAL
  Filled 2022-08-19: qty 2

## 2022-08-19 NOTE — Discharge Instructions (Addendum)
,  It was a pleasure caring for you today in the emergency department.  Please return to the emergency department for any worsening or worrisome symptoms.  Please follow-up with PCP in regards to your asthma  Steroids may increase your blood sugar, please follow your blood sugar closely at home

## 2022-08-19 NOTE — Progress Notes (Unsigned)
Cardiology Office Note:    Date:  08/20/2022   ID:  Donna Garner, DOB 12-05-1945, MRN [id number]  PCP:  Glenis Smoker, MD  [facility] Cardiologist:  Werner Lean, MD  [facility] Electrophysiologist:  None   CC: Follow up DOE  History of Present Illness:    Donna Garner is a 77 y.o. female with a hx of Diabetes with HTN, Asthma, with history of HFpEF and LVH in [place] (Dr. [facility]) who presented for evaluation 05/08/20.   2021: 2022- CT showed moderate non obstructive CAD with normal FFR.  Has worked with [place] [facility] with improvement in BP 2023: Normal Ziopatch, had episode of HTN.  Had 02/12/22 had shortness of breath and chest tightness.  Felt like asthma- was diagnosed with bronchitis. Heart monitor looked good. 2024: Had dietary indiscretions, felt "bad" on 40 mg lasix, felt better on 20 mg  Patient notes that she is doing worse.   For the past week she has had chest pain and  Has been having chest pain for the past week. Has chest tightness.  Improved with resting and drinking water.  Improved with breathing. Started the last week. Breathing is worse with activity. Has been worse since spring.  PFTs in February   Worsened leg swelling. Felt nauseous on higher dose lasix.  Rare PVCs.    Past Medical History:  Diagnosis Date   Asthma    Cardiomegaly    CHF (congestive heart failure) (HCC)    Cough    Diabetes (HCC)    Dyspnea    History of swelling of feet    Hypertension    SOB (shortness of breath)    Swelling of ankle     No past surgical history on file.  Current Medications: Current Meds  Medication Sig   albuterol (PROVENTIL) (2.5 MG/3ML) 0.083% nebulizer solution Take 3 mLs (2.5 mg total) by nebulization every 6 (six) hours as needed for wheezing or shortness of breath.   albuterol (VENTOLIN HFA) 108 (90 Base) MCG/ACT inhaler    aspirin EC 81 MG tablet Take 81 mg by mouth daily. Swallow whole.    atenolol (TENORMIN) 100 MG tablet TAKE 1 TABLET(100 MG) BY MOUTH DAILY   cetirizine (ZYRTEC) 10 MG tablet Take 1 tablet by mouth daily.   Cholecalciferol (VITAMIN D3) 50 MCG (2000 UT) TABS 1 tablet   EPINEPHrine 0.3 mg/0.3 mL IJ SOAJ injection as needed for anaphylaxis.   FARXIGA 5 MG TABS tablet Take 5 mg by mouth daily.   fluticasone (FLONASE) 50 MCG/ACT nasal spray 1 spray in each nostril   furosemide (LASIX) 20 MG tablet Take 1 tablet (20 mg total) by mouth daily.   guaiFENesin (MUCINEX) 600 MG 12 hr tablet Take by mouth 2 (two) times daily as needed for cough or to loosen phlegm.   linaclotide (LINZESS) 72 MCG capsule as needed.   mometasone-formoterol (DULERA) 100-5 MCG/ACT AERO Inhale 2 puffs into the lungs 2 (two) times daily.   montelukast (SINGULAIR) 10 MG tablet Take 10 mg by mouth daily.   nitroGLYCERIN (NITROSTAT) 0.4 MG SL tablet Place 1 tablet (0.4 mg total) under the tongue every 5 (five) minutes as needed for chest pain.   pantoprazole (PROTONIX) 40 MG tablet Take 40 mg by mouth daily.   polyethylene glycol powder (MIRALAX) 17 GM/SCOOP powder as needed for moderate constipation.   predniSONE (STERAPRED UNI-PAK 21 TAB) 10 MG (21) TBPK tablet Take by mouth daily. Take 6  tabs by mouth daily  for 2 days, then 5 tabs for 2 days, then 4 tabs for 2 days, then 3 tabs for 2 days, 2 tabs for 2 days, then 1 tab by mouth daily for 2 days   rosuvastatin (CRESTOR) 10 MG tablet Take 10 mg by mouth daily.   spironolactone (ALDACTONE) 50 MG tablet Take 1 tablet (50 mg total) by mouth daily.   sucralfate (CARAFATE) 1 g tablet Take 1 tablet (1 g total) by mouth with breakfast, with lunch, and with evening meal for 7 days.   traMADol (ULTRAM) 50 MG tablet tramadol 50 mg tablet  Take 1 tablet 3 times a day by oral route as needed for pain for 5 days.   valsartan (DIOVAN) 160 MG tablet TAKE 1 TABLET(160 MG) BY MOUTH DAILY    Allergies:   Glipizide, Hydroxychloroquine, Ibuprofen, Metformin,  Amlodipine, Dulaglutide, Empagliflozin, Metformin and related, Saline nasal spray, Sitagliptin, and Zyrtec [cetirizine]   Social History   Socioeconomic History   Marital status: Legally Separated    Spouse name: Not on file   Number of children: Not on file   Years of education: Not on file   Highest education level: Not on file  Occupational History   Not on file  Tobacco Use   Smoking status: Former    Packs/day: 1.00    Years: 22.00    Total pack years: 22.00    Types: Cigarettes    Start date: 26    Quit date: 104    Years since quitting: 38.2   Smokeless tobacco: Never  Vaping Use   Vaping Use: Never used  Substance and Sexual Activity   Alcohol use: Never   Drug use: Never   Sexual activity: Not on file  Other Topics Concern   Not on file  Social History Narrative   Not on file   Social Determinants of Health   Financial Resource Strain: Not on file  Food Insecurity: No Food Insecurity (03/03/2022)   Hunger Vital Sign    Worried About Running Out of Food in the Last Year: Never true    Ran Out of Food in the Last Year: Never true  Transportation Needs: No Transportation Needs (03/05/2022)   PRAPARE - Hydrologist (Medical): No    Lack of Transportation (Non-Medical): No  Physical Activity: Not on file  Stress: Not on file  Social Connections: Not on file    SOCIAL: Daughter's name Is Clyde Canterbury who comes to every visit. They moved and now have access to a pool (2023)  Family History: The patient's brother and sister died from heart disease NOS  ROS:   Please see the history of present illness.    All other systems reviewed and are negative.  EKGs/Labs/Other Studies Reviewed:    The following studies were reviewed today:  EKG:   08/20/2022: Sinus bradycardia Rare PVC nonspecific TWI in III 04/17/20:  SR rate 82 no ST/T changes  Cardiac Studies & Procedures       ECHOCARDIOGRAM  ECHOCARDIOGRAM COMPLETE  06/05/2020  Narrative ECHOCARDIOGRAM REPORT    Patient Name:   Donna Garner Date of Exam: 06/05/2020 Medical Rec #:  [id number]     Height:       67.0 in Accession #:    [id number]    Weight:       214.4 lb Date of Birth:  07-18-1945    BSA:          2.083 m  Patient Age:    79 years      BP:           104/58 mmHg Patient Gender: F             HR:           61 bpm. Exam Location:  Outpatient  Procedure: 2D Echo, 3D Echo, Cardiac Doppler and Color Doppler  Indications:    CHF (congestive heart failure)  History:        Patient has no prior history of Echocardiogram examinations. Risk Factors:Hypertension and Diabetes. Cardiomegaly. Edema.  Sonographer:    Darlina Sicilian RDCS Referring Phys: [id number] [facility] A [facility]   Sonographer Comments: Global longitudinal strain was attempted. IMPRESSIONS   1. Left ventricular ejection fraction, by estimation, is 65 to 70%. Left ventricular ejection fraction by 3D volume is 69 %. The left ventricle has normal function. The left ventricle has no regional wall motion abnormalities. There is severe left ventricular hypertrophy of the basal-septal segment. Left ventricular diastolic parameters are consistent with Grade I diastolic dysfunction (impaired relaxation). Elevated left ventricular end-diastolic pressure. The average left ventricular global longitudinal strain is -17.7 %. The global longitudinal strain is normal. 2. Right ventricular systolic function is normal. The right ventricular size is normal. There is normal pulmonary artery systolic pressure. The estimated right ventricular systolic pressure is 0000000 mmHg. 3. The mitral valve is normal in structure. Mild mitral valve regurgitation. No evidence of mitral stenosis. 4. The aortic valve is tricuspid. Aortic valve regurgitation is trivial. Mild aortic valve sclerosis is present, with no evidence of aortic valve stenosis. 5. The inferior vena cava is normal in size with greater than  50% respiratory variability, suggesting right atrial pressure of 3 mmHg.  FINDINGS Left Ventricle: Left ventricular ejection fraction, by estimation, is 65 to 70%. Left ventricular ejection fraction by 3D volume is 69 %. The left ventricle has normal function. The left ventricle has no regional wall motion abnormalities. The average left ventricular global longitudinal strain is -17.7 %. The global longitudinal strain is normal. The left ventricular internal cavity size was normal in size. There is severe left ventricular hypertrophy of the basal-septal segment. Left ventricular diastolic parameters are consistent with Grade I diastolic dysfunction (impaired relaxation). Elevated left ventricular end-diastolic pressure.  Right Ventricle: The right ventricular size is normal. No increase in right ventricular wall thickness. Right ventricular systolic function is normal. There is normal pulmonary artery systolic pressure. The tricuspid regurgitant velocity is 2.58 m/s, and with an assumed right atrial pressure of 3 mmHg, the estimated right ventricular systolic pressure is 0000000 mmHg.  Left Atrium: Left atrial size was normal in size.  Right Atrium: Right atrial size was normal in size.  Pericardium: There is no evidence of pericardial effusion.  Mitral Valve: The mitral valve is normal in structure. Mild mitral valve regurgitation. No evidence of mitral valve stenosis.  Tricuspid Valve: The tricuspid valve is normal in structure. Tricuspid valve regurgitation is mild . No evidence of tricuspid stenosis.  Aortic Valve: The aortic valve is tricuspid. Aortic valve regurgitation is trivial. Mild aortic valve sclerosis is present, with no evidence of aortic valve stenosis.  Pulmonic Valve: The pulmonic valve was normal in structure. Pulmonic valve regurgitation is mild. No evidence of pulmonic stenosis.  Aorta: The aortic root is normal in size and structure.  Venous: The inferior vena cava is  normal in size with greater than 50% respiratory variability, suggesting right atrial pressure of 3 mmHg.  IAS/Shunts:  No atrial level shunt detected by color flow Doppler.   LEFT VENTRICLE PLAX 2D LVIDd:         4.50 cm         Diastology LVIDs:         3.10 cm         LV e' medial:    3.30 cm/s LV PW:         1.00 cm         LV E/e' medial:  23.0 LV IVS:        1.00 cm         LV e' lateral:   5.29 cm/s LVOT diam:     2.00 cm         LV E/e' lateral: 14.3 LV SV:         68 LV SV Index:   33              2D LVOT Area:     3.14 cm        Longitudinal Strain 2D Strain GLS  -17.8 % (A2C): 2D Strain GLS  -18.1 % (A3C): 2D Strain GLS  -17.3 % (A4C): 2D Strain GLS  -17.7 % Avg:  3D Volume EF LV 3D EF:    Left ventricular ejection fraction by 3D volume is 69 %.  3D Volume EF: 3D EF:        69 % LV EDV:       123 ml LV ESV:       38 ml LV SV:        85 ml  RIGHT VENTRICLE RV S prime:     13.20 cm/s TAPSE (M-mode): 1.3 cm  LEFT ATRIUM             Index       RIGHT ATRIUM          Index LA diam:        3.50 cm 1.68 cm/m  RA Area:     9.70 cm LA Vol (A2C):   43.5 ml 20.88 ml/m RA Volume:   15.20 ml 7.30 ml/m LA Vol (A4C):   50.9 ml 24.44 ml/m LA Biplane Vol: 47.7 ml 22.90 ml/m AORTIC VALVE LVOT Vmax:   95.30 cm/s LVOT Vmean:  61.000 cm/s LVOT VTI:    0.218 m  AORTA Ao Root diam: 2.50 cm Ao Asc diam:  3.20 cm  MITRAL VALVE               TRICUSPID VALVE MV Area (PHT): 4.21 cm    TR Peak grad:   26.6 mmHg MV Decel Time: 180 msec    TR Vmax:        258.00 cm/s MV E velocity: 75.80 cm/s MV A velocity: 97.70 cm/s  SHUNTS MV E/A ratio:  0.78        Systemic VTI:  0.22 m Systemic Diam: 2.00 cm  Fransico Him MD Electronically signed by Fransico Him MD Signature Date/Time: 06/05/2020/9:42:09 AM    Final    MONITORS  LONG TERM MONITOR (3-14 DAYS) 04/27/2022  Narrative   Patient had a minimum heart rate of 42 bpm, maximum heart rate of 139 bpm, and  average heart rate of 57 bpm.   Predominant underlying rhythm was sinus rhythm.   NSVT  lasting 6 beats at longest with a max rate of 139 bpm at fastest.   Isolated PACs were rare (<1.0%).   Isolated PVCs were rare (<1.0%).   Triggered and diary  events associated with sinus rhythm.  Rare asymptomatic NSVT.   CT SCANS  CT CORONARY MORPH W/CTA COR W/SCORE 08/20/2020  Addendum 08/20/2020  3:23 PM ADDENDUM REPORT: 08/20/2020 15:20  CLINICAL DATA:  77 Year-old African American Female  EXAM: Cardiac/Coronary  CTA  TECHNIQUE: The patient was scanned on a [employer].  FINDINGS: A 100 kV prospective scan was triggered in the descending thoracic aorta at 111 HU's. Axial non-contrast 3 mm slices were carried out through the heart. The data set was analyzed on a dedicated work station and scored using the [place]. Gantry rotation speed was 250 msecs and collimation was .6 mm. No beta blockade and 0.8 mg of sl NTG was given. The 3D data set was reconstructed in 5% intervals of the 67-82 % of the R-R cycle. Diastolic phases were analyzed on a dedicated work station using MPR, MIP and VRT modes. The patient received 80 cc of contrast.  Aorta:  Normal size.  Aortic atherosclerosis noted.  No dissection.  Aortic Valve:  Tri-leaflet.  No calcifications.  Coronary Arteries:  Normal coronary origin.  Right dominance.  Coronary calcium score of 255. This was 83rd percentile for age, sex, and race matched control.  RCA is a large dominant artery that gives rise to PDA and PLA. There is a minimal non-obstructive calcified plaque (1-24%) in the proximal vessel. There is a minimal non-obstructive calcified plaque (1-24%) in the mid vessel.  Left main is a large artery that gives rise to LAD and LCX arteries. There is no plaque.  LAD is a large vessel that gives rise to two diagonal vessels with a D1 bifurcation. There is a mild non-obstructive calcified plaque (25-49%)  in the proximal vessel. There is a moderate stenosis calcified plaque (50-70%) in the mid vessel.  LCX is a non-dominant artery that gives rise to one OM1 branch and bifurcates to a OM2 vessel. There is a moderate stenosis calcified plaque (50-70%) in the proximal vessel. There is a mild non-obstructive calcified plaque (25-50%) in the mid vessel.  Other findings:  Normal pulmonary vein drainage into the left atrium.  Normal left atrial appendage without a thrombus.  Upper limit of normal pulmonary artery: 36 mm.  Extra-cardiac findings: See attached radiology report for non-cardiac structures.  IMPRESSION: 1. Coronary calcium score of 255. This was 83rd percentile for age, sex, and race matched control.  2. Normal coronary origin.  Right dominance.  3. CAD-RADS 3. Moderate stenosis in the LAD and LCX. Consider symptom-guided anti-ischemic pharmacotherapy as well as risk factor modification per guideline directed care. Additional analysis with CT FFR will be submitted.  4. Aortic atherosclerosis noted.  5. Upper limit of normal pulmonary artery: 36 mm.   Electronically Signed By: Rudean Haskell MD On: 08/20/2020 15:20  Narrative EXAM: OVER-READ INTERPRETATION  CT CHEST  The following report is an over-read performed by radiologist Dr. Abigail Miyamoto of [facility], [place] on 08/20/2020. This over-read does not include interpretation of cardiac or coronary anatomy or pathology. The coronary CTA interpretation by the cardiologist is attached.  COMPARISON:  Chest radiographs including 07/31/2020  FINDINGS: Vascular: Aortic atherosclerosis. No central pulmonary embolism, on this non-dedicated study.  Mediastinum/Nodes: No imaged thoracic adenopathy. Small to moderate hiatal hernia.  Lungs/Pleura: No pleural fluid.  Clear imaged lungs.  Upper Abdomen: Normal imaged portions of the liver, spleen.  Musculoskeletal: Right breast 7 mm partially calcified  nodule on 07/11. Lower thoracic spondylosis.  IMPRESSION: 1.  No acute findings in the  imaged extracardiac chest. 2.  Aortic Atherosclerosis (ICD10-I70.0). 3. 7 mm partially calcified nonspecific right-sided breast nodule. Consider correlation with mammogram and possibly ultrasound. 4. Hiatal hernia.  These results will be called to the ordering clinician or representative by the Radiologist Assistant, and communication documented in the PACS or [employer].  Electronically Signed: By: Abigail Miyamoto M.D. On: 08/20/2020 08:39             Recent Labs: 02/12/2022: ALT 22 08/18/2022: B Natriuretic Peptide 24.3; BUN 13; Creatinine, Ser 0.93; Hemoglobin 14.6; Magnesium 2.3; Platelets 208; Potassium 3.1; Sodium 137  Recent Lipid Panel No results found for: "CHOL", "TRIG", "HDL", "CHOLHDL", "VLDL", "LDLCALC", "LDLDIRECT"   Physical Exam:    VS:  BP (!) 122/58   Pulse (!) 57   Ht '5\' 7"'$  (1.702 m)   Wt 196 lb (88.9 kg)   SpO2 97%   BMI 30.70 kg/m     Wt Readings from Last 3 Encounters:  08/20/22 196 lb (88.9 kg)  07/10/22 191 lb (86.6 kg)  06/11/22 195 lb (88.5 kg)    Gen: No distress Neck: No JVD Cardiac: No Rubs or Gallops, No murmur, RRR, +2 radial pulses Respiratory: Clear to auscultation bilaterally, normal effort, normal  respiratory rate GI: Soft, nontender, non-distended  MS: No pitting edema wearing compression stockings;  moves all extremities Integument: Skin feels warm Neuro:  At time of evaluation, alert and oriented to person/place/time/situation  Psych: Normal affect, patient feels well  ASSESSMENT:    1. Chest pain of uncertain etiology   2. Coronary artery disease involving native coronary artery of native heart without angina pectoris   3. Chronic heart failure with preserved ejection fraction ([place])   4. PVC's (premature ventricular contractions)     PLAN:    Moderate Non-Obstructive CAD Recent asthma exacerbation GERD - possible cardiac; with  PVCs and normal EF in the past - Lexiscan-> this would allow [place] to r/u out CAD, confirm no change in EF, and view PVC burden - continue ASA and statin - gave PRN nitro; this may be asthma or GERD  Heart Failure with preserved EF - NYHA class I, Stage B, euvolemic - she has "felt bad" on increased lasix dosing, on jardiance,  - Aldactone 50 mg PO daily - on lower dose SGLT2i managed by primary (highest tolerated dose) - she may need to push through higher diuretic dose in future  HTN with DM - controlled on current therapy  Rare PVCs  - rarely symptomatic on BB defer ziopatch unless abnormal stress  Summer f/u me or APP   Medication Adjustments/Labs and Tests Ordered: Current medicines are reviewed at length with the patient today.  Concerns regarding medicines are outlined above.  Orders Placed This Encounter  Procedures   MYOCARDIAL PERFUSION IMAGING   EKG 12-Lead   Meds ordered this encounter  Medications   nitroGLYCERIN (NITROSTAT) 0.4 MG SL tablet    Sig: Place 1 tablet (0.4 mg total) under the tongue every 5 (five) minutes as needed for chest pain.    Dispense:  90 tablet    Refill:  3    Patient Instructions  Medication Instructions:  Your physician has recommended you make the following change in your medication:  START: Nitroglycerin 0.4 mg under your tongue as needed for Chest Pain  If you have chest pain place 1 tablet under your tongue, wait 5 min If chest pain continues place a 2nd tablet under your tongue, wait 5 min If chest pain continues place  a 3rd tablet under your tongue, wait 5 min If chest pain continues call 911  *If you need a refill on your cardiac medications before your next appointment, please call your pharmacy*   Lab Work: NONE If you have labs (blood work) drawn today and your tests are completely normal, you will receive your results only by: [facility] (if you have MyChart) OR A paper copy in the mail If you have any lab test  that is abnormal or we need to change your treatment, we will call you to review the results.   Testing/Procedures: Your physician has requested that you have a lexiscan myoview. For further information please visit [email or web address]. Please follow instruction sheet, as given.   You are scheduled for a Myocardial Perfusion Imaging Study. Please arrive 15 minutes prior to your appointment time for registration and insurance purposes.   The test will take approximately 3 to 4 hours to complete; you may bring reading material.  If someone comes with you to your appointment, they will need to remain in the main lobby due to limited space in the testing area.    How to prepare for your Myocardial Perfusion Test: Do not eat or drink 3 hours prior to your test, except you may have water. Do not consume products containing caffeine (regular or decaffeinated) 12 hours prior to your test. (ex: coffee, chocolate, sodas, tea). Do bring a list of your current medications with you.  If not listed below, you may take your medications as normal. Do wear comfortable clothes (no dresses or overalls) and walking shoes, tennis shoes preferred (No heels or open toe shoes are allowed). Do NOT wear cologne, perfume, aftershave, or lotions (deodorant is allowed). If these instructions are not followed, your test will have to be rescheduled.  If you cannot keep your appointment, please provide 24 hours notification to the Nuclear Lab, to avoid a possible $50 charge to your account.       Follow-Up: At [facility], you and your health needs are our priority.  As part of our continuing mission to provide you with exceptional heart care, we have created designated Provider Care Teams.  These Care Teams include your primary Cardiologist (physician) and Advanced Practice Providers (APPs -  Physician Assistants and Nurse Practitioners) who all work together to provide you with the care you need, when you need  it.  We recommend signing up for the patient portal called "MyChart".  Sign up information is provided on this After Visit Summary.  MyChart is used to connect with patients for Virtual Visits (Telemedicine).  Patients are able to view lab/test results, encounter notes, upcoming appointments, etc.  Non-urgent messages can be sent to your provider as well.   To learn more about what you can do with MyChart, go to [email or web address].    Your next appointment:   3-4 month(s)  Provider:   Werner Lean, MD  or Nicholes Rough, PA-C, Ambrose Pancoast, NP, or Christen Bame, NP         Signed, Werner Lean, MD  08/20/2022 10:24 AM    [place]

## 2022-08-20 ENCOUNTER — Ambulatory Visit: Payer: Medicare Other | Attending: Internal Medicine | Admitting: Internal Medicine

## 2022-08-20 ENCOUNTER — Encounter: Payer: Self-pay | Admitting: Internal Medicine

## 2022-08-20 VITALS — BP 122/58 | HR 57 | Ht 67.0 in | Wt 196.0 lb

## 2022-08-20 DIAGNOSIS — I2510 Atherosclerotic heart disease of native coronary artery without angina pectoris: Secondary | ICD-10-CM | POA: Diagnosis not present

## 2022-08-20 DIAGNOSIS — I5032 Chronic diastolic (congestive) heart failure: Secondary | ICD-10-CM | POA: Diagnosis not present

## 2022-08-20 DIAGNOSIS — R079 Chest pain, unspecified: Secondary | ICD-10-CM | POA: Diagnosis not present

## 2022-08-20 DIAGNOSIS — I493 Ventricular premature depolarization: Secondary | ICD-10-CM | POA: Insufficient documentation

## 2022-08-20 MED ORDER — NITROGLYCERIN 0.4 MG SL SUBL: 0.4000 mg | SUBLINGUAL_TABLET | SUBLINGUAL | 3 refills | Status: DC | PRN

## 2022-08-20 NOTE — Patient Instructions (Signed)
Medication Instructions:  Your physician has recommended you make the following change in your medication:  START: Nitroglycerin 0.4 mg under your tongue as needed for Chest Pain  If you have chest pain place 1 tablet under your tongue, wait 5 min If chest pain continues place a 2nd tablet under your tongue, wait 5 min If chest pain continues place a 3rd tablet under your tongue, wait 5 min If chest pain continues call 911  *If you need a refill on your cardiac medications before your next appointment, please call your pharmacy*   Lab Work: NONE If you have labs (blood work) drawn today and your tests are completely normal, you will receive your results only by: [place] (if you have MyChart) OR A paper copy in the mail If you have any lab test that is abnormal or we need to change your treatment, we will call you to review the results.   Testing/Procedures: Your physician has requested that you have a lexiscan myoview. For further information please visit [email or web address]. Please follow instruction sheet, as given.   You are scheduled for a Myocardial Perfusion Imaging Study. Please arrive 15 minutes prior to your appointment time for registration and insurance purposes.   The test will take approximately 3 to 4 hours to complete; you may bring reading material.  If someone comes with you to your appointment, they will need to remain in the main lobby due to limited space in the testing area.    How to prepare for your Myocardial Perfusion Test: Do not eat or drink 3 hours prior to your test, except you may have water. Do not consume products containing caffeine (regular or decaffeinated) 12 hours prior to your test. (ex: coffee, chocolate, sodas, tea). Do bring a list of your current medications with you.  If not listed below, you may take your medications as normal. Do wear comfortable clothes (no dresses or overalls) and walking shoes, tennis shoes preferred (No heels or  open toe shoes are allowed). Do NOT wear cologne, perfume, aftershave, or lotions (deodorant is allowed). If these instructions are not followed, your test will have to be rescheduled.  If you cannot keep your appointment, please provide 24 hours notification to the Nuclear Lab, to avoid a possible $50 charge to your account.       Follow-Up: At [facility], you and your health needs are our priority.  As part of our continuing mission to provide you with exceptional heart care, we have created designated Provider Care Teams.  These Care Teams include your primary Cardiologist (physician) and Advanced Practice Providers (APPs -  Physician Assistants and Nurse Practitioners) who all work together to provide you with the care you need, when you need it.  We recommend signing up for the patient portal called "MyChart".  Sign up information is provided on this After Visit Summary.  MyChart is used to connect with patients for Virtual Visits (Telemedicine).  Patients are able to view lab/test results, encounter notes, upcoming appointments, etc.  Non-urgent messages can be sent to your provider as well.   To learn more about what you can do with MyChart, go to [email or web address].    Your next appointment:   3-4 month(s)  Provider:   Werner Lean, MD  or Nicholes Rough, PA-C, Ambrose Pancoast, NP, or Christen Bame, NP

## 2022-08-27 DIAGNOSIS — E785 Hyperlipidemia, unspecified: Secondary | ICD-10-CM | POA: Diagnosis not present

## 2022-08-27 DIAGNOSIS — N1831 Chronic kidney disease, stage 3a: Secondary | ICD-10-CM | POA: Diagnosis not present

## 2022-08-27 DIAGNOSIS — E1169 Type 2 diabetes mellitus with other specified complication: Secondary | ICD-10-CM | POA: Diagnosis not present

## 2022-08-27 DIAGNOSIS — I10 Essential (primary) hypertension: Secondary | ICD-10-CM | POA: Diagnosis not present

## 2022-08-28 ENCOUNTER — Telehealth (HOSPITAL_COMMUNITY): Payer: Self-pay | Admitting: *Deleted

## 2022-08-28 NOTE — Telephone Encounter (Signed)
Patient given detailed instructions per Myocardial Perfusion Study Information Sheet for the test on 09/01/2022 at 7:15. Patient notified to arrive 15 minutes early and that it is imperative to arrive on time for appointment to keep from having the test rescheduled.  If you need to cancel or reschedule your appointment, please call the office within 24 hours of your appointment. . Patient verbalized understanding.Donna Garner

## 2022-09-01 ENCOUNTER — Ambulatory Visit (HOSPITAL_COMMUNITY): Payer: Medicare Other | Attending: Cardiology

## 2022-09-01 VITALS — Ht 67.0 in | Wt 196.0 lb

## 2022-09-01 DIAGNOSIS — R110 Nausea: Secondary | ICD-10-CM | POA: Insufficient documentation

## 2022-09-01 DIAGNOSIS — R079 Chest pain, unspecified: Secondary | ICD-10-CM | POA: Insufficient documentation

## 2022-09-01 LAB — MYOCARDIAL PERFUSION IMAGING
LV dias vol: 53 mL (ref 46–106)
LV sys vol: 13 mL
Nuc Stress EF: 75 %
Peak HR: 96 {beats}/min
Rest HR: 63 {beats}/min
Rest Nuclear Isotope Dose: 10.5 mCi
SDS: 1
SRS: 2
SSS: 4
ST Depression (mm): 0 mm
Stress Nuclear Isotope Dose: 30.3 mCi
TID: 1.24

## 2022-09-01 MED ORDER — TECHNETIUM TC 99M TETROFOSMIN IV KIT
10.5000 | PACK | Freq: Once | INTRAVENOUS | Status: AC | PRN
  Administered 2022-09-01: 10.5 via INTRAVENOUS

## 2022-09-01 MED ORDER — AMINOPHYLLINE 25 MG/ML IV SOLN
75.0000 mg | Freq: Once | INTRAVENOUS | Status: AC
  Administered 2022-09-01: 75 mg via INTRAVENOUS

## 2022-09-01 MED ORDER — REGADENOSON 0.4 MG/5ML IV SOLN
0.4000 mg | Freq: Once | INTRAVENOUS | Status: AC
  Administered 2022-09-01: 0.4 mg via INTRAVENOUS

## 2022-09-01 MED ORDER — TECHNETIUM TC 99M TETROFOSMIN IV KIT
30.3000 | PACK | Freq: Once | INTRAVENOUS | Status: AC | PRN
  Administered 2022-09-01: 30.3 via INTRAVENOUS

## 2022-09-17 ENCOUNTER — Inpatient Hospital Stay: Admission: RE | Admit: 2022-09-17 | Payer: Medicare Other | Source: Ambulatory Visit

## 2022-09-17 ENCOUNTER — Other Ambulatory Visit: Payer: Medicare Other

## 2022-09-19 ENCOUNTER — Ambulatory Visit
Admission: RE | Admit: 2022-09-19 | Discharge: 2022-09-19 | Disposition: A | Discharge status: Home / Self Care | Payer: Medicare Other | Source: Ambulatory Visit | Attending: Family Medicine | Admitting: Family Medicine

## 2022-09-19 DIAGNOSIS — I700 Atherosclerosis of aorta: Secondary | ICD-10-CM | POA: Diagnosis not present

## 2022-09-19 DIAGNOSIS — M25811 Other specified joint disorders, right shoulder: Secondary | ICD-10-CM

## 2022-09-19 DIAGNOSIS — R918 Other nonspecific abnormal finding of lung field: Secondary | ICD-10-CM | POA: Diagnosis not present

## 2022-09-19 DIAGNOSIS — K449 Diaphragmatic hernia without obstruction or gangrene: Secondary | ICD-10-CM | POA: Diagnosis not present

## 2022-09-19 DIAGNOSIS — J432 Centrilobular emphysema: Secondary | ICD-10-CM | POA: Diagnosis not present

## 2022-09-19 MED ORDER — IOPAMIDOL (ISOVUE-300) INJECTION 61%
75.0000 mL | Freq: Once | INTRAVENOUS | Status: AC | PRN
  Administered 2022-09-19: 75 mL via INTRAVENOUS

## 2022-09-20 DIAGNOSIS — M5416 Radiculopathy, lumbar region: Secondary | ICD-10-CM | POA: Diagnosis not present

## 2022-09-23 DIAGNOSIS — I700 Atherosclerosis of aorta: Secondary | ICD-10-CM | POA: Diagnosis not present

## 2022-09-23 DIAGNOSIS — E785 Hyperlipidemia, unspecified: Secondary | ICD-10-CM | POA: Diagnosis not present

## 2022-09-23 DIAGNOSIS — E1169 Type 2 diabetes mellitus with other specified complication: Secondary | ICD-10-CM | POA: Diagnosis not present

## 2022-09-23 DIAGNOSIS — E1122 Type 2 diabetes mellitus with diabetic chronic kidney disease: Secondary | ICD-10-CM | POA: Diagnosis not present

## 2022-09-23 DIAGNOSIS — Z0000 Encounter for general adult medical examination without abnormal findings: Secondary | ICD-10-CM | POA: Diagnosis not present

## 2022-09-23 DIAGNOSIS — Z9181 History of falling: Secondary | ICD-10-CM | POA: Diagnosis not present

## 2022-09-23 DIAGNOSIS — N1831 Chronic kidney disease, stage 3a: Secondary | ICD-10-CM | POA: Diagnosis not present

## 2022-09-23 DIAGNOSIS — Z23 Encounter for immunization: Secondary | ICD-10-CM | POA: Diagnosis not present

## 2022-09-23 DIAGNOSIS — I10 Essential (primary) hypertension: Secondary | ICD-10-CM | POA: Diagnosis not present

## 2022-09-29 ENCOUNTER — Telehealth: Payer: Self-pay | Admitting: Internal Medicine

## 2022-09-29 NOTE — Telephone Encounter (Signed)
Pt c/o medication issue:  1. Name of Medication:   furosemide (LASIX) 20 MG tablet   2. How are you currently taking this medication (dosage and times per day)?   3. Are you having a reaction (difficulty breathing--STAT)?   4. What is your medication issue?   Caller wants clarification on the strength and dosage for this medication.

## 2022-09-29 NOTE — Telephone Encounter (Signed)
Spoke to [facility] pharmacy associate to clarify the dosage of lasix. Patient had two prescriptions for lasix 40 mg and 20 mg. During an previous office visit on 06/11/22, patient was advised  INCREASE: furosemide (Lasix) to 40 mg by mouth once daily for 1 week.   On 07/01/22 patient daughter contacted the office: Spoke with the patient's daughter who states that the patient did not feel well on the increased dose of Lasix (40 mg). She saw her PCP yesterday who recommended that she go back down to 20 mg daily. She will need Dr. Izora Ribas to send in a new prescription. Advised to let us know if shortness of breath or swelling worsens back on lower dose. Will send to Dr. Ronette Deter to make sure he is okay with reduced dose.      On 07/02/22 We can send in the reduced dose.  In the future, patient's PCP, who made the change, would also be able to send the prescription.   Advised [facility] pharmacy associated patient is currently prescribed 20 mg lasix, associate voiced understanding.

## 2022-09-30 ENCOUNTER — Other Ambulatory Visit: Payer: Self-pay

## 2022-09-30 MED ORDER — SPIRONOLACTONE 50 MG PO TABS: 50.0000 mg | ORAL_TABLET | Freq: Every day | ORAL | 3 refills | Status: DC

## 2022-10-06 ENCOUNTER — Emergency Department (HOSPITAL_COMMUNITY)
Admission: EM | Admit: 2022-10-06 | Discharge: 2022-10-06 | Disposition: A | Discharge status: Home / Self Care | Payer: Medicare Other | Attending: Emergency Medicine | Admitting: Emergency Medicine

## 2022-10-06 ENCOUNTER — Emergency Department (HOSPITAL_COMMUNITY): Payer: Medicare Other

## 2022-10-06 ENCOUNTER — Other Ambulatory Visit: Payer: Self-pay

## 2022-10-06 DIAGNOSIS — S01511A Laceration without foreign body of lip, initial encounter: Secondary | ICD-10-CM | POA: Insufficient documentation

## 2022-10-06 DIAGNOSIS — W19XXXA Unspecified fall, initial encounter: Secondary | ICD-10-CM | POA: Insufficient documentation

## 2022-10-06 DIAGNOSIS — W06XXXA Fall from bed, initial encounter: Secondary | ICD-10-CM | POA: Diagnosis not present

## 2022-10-06 DIAGNOSIS — S0990XA Unspecified injury of head, initial encounter: Secondary | ICD-10-CM | POA: Diagnosis not present

## 2022-10-06 DIAGNOSIS — Z79899 Other long term (current) drug therapy: Secondary | ICD-10-CM | POA: Diagnosis not present

## 2022-10-06 DIAGNOSIS — R220 Localized swelling, mass and lump, head: Secondary | ICD-10-CM | POA: Diagnosis not present

## 2022-10-06 DIAGNOSIS — Z7982 Long term (current) use of aspirin: Secondary | ICD-10-CM | POA: Insufficient documentation

## 2022-10-06 DIAGNOSIS — Y92003 Bedroom of unspecified non-institutional (private) residence as the place of occurrence of the external cause: Secondary | ICD-10-CM | POA: Diagnosis not present

## 2022-10-06 DIAGNOSIS — T1490XA Injury, unspecified, initial encounter: Secondary | ICD-10-CM | POA: Diagnosis not present

## 2022-10-06 MED ORDER — LIDOCAINE-EPINEPHRINE-TETRACAINE (LET) TOPICAL GEL
3.0000 mL | Freq: Once | TOPICAL | Status: AC
  Administered 2022-10-06: 3 mL via TOPICAL
  Filled 2022-10-06: qty 3

## 2022-10-06 MED ORDER — LIDOCAINE HCL (PF) 1 % IJ SOLN
5.0000 mL | Freq: Once | INTRAMUSCULAR | Status: AC
  Administered 2022-10-06: 5 mL
  Filled 2022-10-06: qty 30

## 2022-10-06 NOTE — Discharge Instructions (Addendum)
Your workup today was overall reassuring.  Your laceration was repaired with 3 absorbable sutures.  These will dissolve in about 3 to 5 days.  If you have any concerning symptoms return to the emergency department otherwise follow-up with your primary care provider.  Your CT scan of the head and neck did not show any concerning findings.  You may still develop concussion symptoms.  Those included occasional nausea and vomiting, sensitivity to light, and headache.  Vision change, persistent nausea and vomiting are concerning along with severe headache.  These warrant coming back to the emergency department.  In addition to your tramadol you can take 1000 mg Tylenol every 8 hours.

## 2022-10-06 NOTE — ED Provider Notes (Signed)
[place] EMERGENCY DEPARTMENT AT [facility] Provider Note   CSN: [id number] Arrival date & time: 10/06/22  1916     History  Chief Complaint  Patient presents with   Donna Garner is a 76 y.o. female.  Pt complains of mechanical fall in her bedroom.  Struck the right side of her face on hardwood floor.  Small laceration to lip.  Hematoma to forehead.  Complains of headache.  Otherwise no complaints..  The history is provided by the patient. No language interpreter was used.       Home Medications Prior to Admission medications   Medication Sig Start Date End Date Taking? Authorizing Provider  albuterol (PROVENTIL) (2.5 MG/3ML) 0.083% nebulizer solution Take 3 mLs (2.5 mg total) by nebulization every 6 (six) hours as needed for wheezing or shortness of breath. 02/12/22   Kommor, [place], MD  albuterol (VENTOLIN HFA) 108 (90 Base) MCG/ACT inhaler  04/05/21   [provider]  aspirin EC 81 MG tablet Take 81 mg by mouth daily. Swallow whole.    [provider]  atenolol (TENORMIN) 100 MG tablet TAKE 1 TABLET(100 MG) BY MOUTH DAILY 07/03/22   Chandrasekhar, Mahesh A, MD  cetirizine (ZYRTEC) 10 MG tablet Take 1 tablet by mouth daily.    [provider]  Cholecalciferol (VITAMIN D3) 50 MCG (2000 UT) TABS 1 tablet    [provider]  EPINEPHrine 0.3 mg/0.3 mL IJ SOAJ injection as needed for anaphylaxis.    [provider]  FARXIGA 5 MG TABS tablet Take 5 mg by mouth daily. 03/09/22   [provider]  fluticasone Aleda Grana) 50 MCG/ACT nasal spray 1 spray in each nostril 11/07/20   [provider]  furosemide (LASIX) 20 MG tablet Take 1 tablet (20 mg total) by mouth daily. 07/02/22   Chandrasekhar, Rondel Jumbo, MD  guaiFENesin (MUCINEX) 600 MG 12 hr tablet Take by mouth 2 (two) times daily as needed for cough or to loosen phlegm.    [provider]  linaclotide Karlene Einstein) 72 MCG capsule as needed. 05/07/22    [provider]  mometasone-formoterol (DULERA) 100-5 MCG/ACT AERO Inhale 2 puffs into the lungs 2 (two) times daily.    [provider]  montelukast (SINGULAIR) 10 MG tablet Take 10 mg by mouth daily. 04/12/20   [provider]  nitroGLYCERIN (NITROSTAT) 0.4 MG SL tablet Place 1 tablet (0.4 mg total) under the tongue every 5 (five) minutes as needed for chest pain. 08/20/22 11/18/22  Riley Lam A, MD  pantoprazole (PROTONIX) 40 MG tablet Take 40 mg by mouth daily.    [provider]  polyethylene glycol powder (MIRALAX) 17 GM/SCOOP powder as needed for moderate constipation.    [provider]  predniSONE (STERAPRED UNI-PAK 21 TAB) 10 MG (21) TBPK tablet Take by mouth daily. Take 6 tabs by mouth daily  for 2 days, then 5 tabs for 2 days, then 4 tabs for 2 days, then 3 tabs for 2 days, 2 tabs for 2 days, then 1 tab by mouth daily for 2 days 08/18/22   Tanda Rockers A, DO  rosuvastatin (CRESTOR) 10 MG tablet Take 10 mg by mouth daily.    [provider]  spironolactone (ALDACTONE) 50 MG tablet Take 1 tablet (50 mg total) by mouth daily. 09/30/22   Chandrasekhar, Lafayette Dragon A, MD  sucralfate (CARAFATE) 1 g tablet Take 1 tablet (1 g total) by mouth with breakfast, with lunch, and with evening meal  for 7 days. 08/19/22 08/26/22  Sloan Leiter, DO  traMADol (ULTRAM) 50 MG tablet tramadol 50 mg tablet  Take 1 tablet 3 times a day by oral route as needed for pain for 5 days.    [provider]  valsartan (DIOVAN) 160 MG tablet TAKE 1 TABLET(160 MG) BY MOUTH DAILY 04/28/22   Chandrasekhar, Mahesh A, MD      Allergies    Glipizide, Hydroxychloroquine, Ibuprofen, Metformin, Amlodipine, Dulaglutide, Empagliflozin, Metformin and related, Saline nasal spray, Sitagliptin, and Zyrtec [cetirizine]    Review of Systems   Review of Systems  Physical Exam Updated Vital Signs BP (!) 141/81 (BP Location: Left Arm)   Pulse 68   Temp 99 F (37.2 C)  (Oral)   Resp 16   SpO2 99%  Physical Exam  ED Results / Procedures / Treatments   Labs (all labs ordered are listed, but only abnormal results are displayed) Labs Reviewed - No data to display  EKG None  Radiology CT Cervical Spine Wo Contrast  Result Date: 10/06/2022 CLINICAL DATA:  Trauma EXAM: CT CERVICAL SPINE WITHOUT CONTRAST TECHNIQUE: Multidetector CT imaging of the cervical spine was performed without intravenous contrast. Multiplanar CT image reconstructions were also generated. RADIATION DOSE REDUCTION: This exam was performed according to the departmental dose-optimization program which includes automated exposure control, adjustment of the mA and/or kV according to patient size and/or use of iterative reconstruction technique. COMPARISON:  None Available. FINDINGS: Alignment: Normal. Skull base and vertebrae: No acute fracture. No primary bone lesion or focal pathologic process. Soft tissues and spinal canal: No prevertebral fluid or swelling. No visible canal hematoma. Disc levels: Disc space narrowing with anterior and posterior projecting osteophytes C3-4 through C6-7. Upper chest: Negative. IMPRESSION: Degenerative changes.  No acute traumatic abnormalities. Electronically Signed   By: Layla Maw M.D.   On: 10/06/2022 20:50   CT Head Wo Contrast  Result Date: 10/06/2022 CLINICAL DATA:  Polytrauma, blunt EXAM: CT HEAD WITHOUT CONTRAST TECHNIQUE: Contiguous axial images were obtained from the base of the skull through the vertex without intravenous contrast. RADIATION DOSE REDUCTION: This exam was performed according to the departmental dose-optimization program which includes automated exposure control, adjustment of the mA and/or kV according to patient size and/or use of iterative reconstruction technique. COMPARISON:  None Available. FINDINGS: Brain: There is periventricular white matter decreased attenuation consistent with small vessel ischemic changes. Ventricles,  sulci and cisterns are prominent consistent with age related involutional changes. No acute intracranial hemorrhage, mass effect or shift. No hydrocephalus. Vascular: No hyperdense vessel or unexpected calcification. Skull: Right midline frontal scalp soft tissue swelling consistent with cephalohematoma negative for fracture or focal lesion. Sinuses/Orbits: No acute finding. IMPRESSION: 1. Frontal scalp soft tissue swelling consistent with cephalohematoma. 2. Atrophy and chronic small vessel ischemic changes. 3. No acute intracranial process identified. Electronically Signed   By: Layla Maw M.D.   On: 10/06/2022 20:48    Procedures .Marland KitchenLaceration Repair  Date/Time: 10/06/2022 10:43 PM  Performed by: Marita Kansas, PA-C Authorized by: Marita Kansas, PA-C   Consent:    Consent obtained:  Verbal   Consent given by:  Patient   Risks discussed:  Infection and poor cosmetic result Universal protocol:    Procedure explained and questions answered to patient or proxy's satisfaction: yes     Relevant documents present and verified: yes     Test results available: yes     Patient identity confirmed:  Verbally with patient and arm band Laceration details:  Location:  Lip   Lip location:  Upper lip, full thickness   [place] border involved: no     Length (cm):  0.5 Pre-procedure details:    Preparation:  Patient was prepped and draped in usual sterile fashion Treatment:    Area cleansed with:  Povidone-iodine and chlorhexidine   Amount of cleaning:  Standard   Irrigation solution:  Sterile saline   Debridement:  None   Undermining:  None Skin repair:    Repair method:  Sutures   Suture size:  5-0   Suture material:  Fast-absorbing gut   Suture technique:  Simple interrupted   Number of sutures:  3 Approximation:    Approximation:  Close Repair type:    Repair type:  Simple Post-procedure details:    Procedure completion:  Tolerated well, no immediate complications     Medications  Ordered in ED Medications  lidocaine-EPINEPHrine-tetracaine (LET) topical gel (has no administration in time range)  lidocaine (PF) (XYLOCAINE) 1 % injection 5 mL (has no administration in time range)    ED Course/ Medical Decision Making/ A&P Clinical Course as of 10/06/22 2109  [facility] Oct 06, 2022  2058 CT Cervical Spine Wo Contrast [AA]    Clinical Course User Index [AA] Marita Kansas, PA-C                             Medical Decision Making Amount and/or Complexity of Data Reviewed Radiology: ordered. Decision-making details documented in ED Course.  Risk Prescription drug management.   77 year old female presents today for evaluation following a mechanical fall. Hematoma noted. Through and through lac noted to upper lip. Spares the [place] border. Lac repaired with 3 stitches. Return precautions discussed.  Wound care discussed.  Patient voices understanding and is in agreement with plan.  Daughter is at bedside.  She is in agreement as well.  Concussion symptoms discussed although she does not have any now given the mechanism.   Final Clinical Impression(s) / ED Diagnoses Final diagnoses:  Fall, initial encounter  Injury of head, initial encounter  Lip laceration, initial encounter    Rx / DC Orders ED Discharge Orders     None         Marita Kansas, PA-C 10/06/22 2244    Virgina Norfolk, DO 10/06/22 2256

## 2022-10-06 NOTE — ED Provider Triage Note (Signed)
Emergency Medicine Provider Triage Evaluation Note  Donna Garner , a 77 y.o. female  was evaluated in triage.  Pt complains of mechanical fall in her bedroom.  Struck the right side of her face on hardwood floor.  Small laceration to lip.  Hematoma to forehead.  Complains of headache.  Otherwise no complaints..  Review of Systems  Positive: As above Negative: As above  Physical Exam  BP (!) 141/81 (BP Location: Left Arm)   Pulse 68   Temp 99 F (37.2 C) (Oral)   Resp 16   SpO2 99%  Gen:   Awake, no distress   Resp:  Normal effort  MSK:   Moves extremities without difficulty  Other:   Medical Decision Making  Medically screening exam initiated at 7:52 PM.  Appropriate orders placed.  LORRE OPDAHL was informed that the remainder of the evaluation will be completed by another provider, this initial triage assessment does not replace that evaluation, and the importance of remaining in the ED until their evaluation is complete.     Marita Kansas, PA-C 10/06/22 1952

## 2022-10-06 NOTE — ED Triage Notes (Signed)
Pt arrives POV. States that she had a mechanical slip and fall and hit her face/head on hardwood floor at home approx 30 minutes PTA. Swelling noted to upper lip and forehead. Small laceration noted to lep with bleeding controlled. Pt c/o 7/10 headache. Denies LOC. On daily ASA.

## 2022-10-07 ENCOUNTER — Ambulatory Visit
Admission: EM | Admit: 2022-10-07 | Discharge: 2022-10-07 | Disposition: A | Discharge status: Home / Self Care | Payer: Medicare Other | Attending: Physician Assistant | Admitting: Physician Assistant

## 2022-10-07 DIAGNOSIS — S01511A Laceration without foreign body of lip, initial encounter: Secondary | ICD-10-CM

## 2022-10-07 MED ORDER — AMOXICILLIN-POT CLAVULANATE 875-125 MG PO TABS: 1.0000 | ORAL_TABLET | Freq: Two times a day (BID) | ORAL | 0 refills | Status: DC

## 2022-10-07 NOTE — ED Provider Notes (Signed)
[facility]    CSN: [id number] Arrival date & time: 10/07/22  1832      History   Chief Complaint Chief Complaint  Patient presents with   Oral Swelling    HPI Donna Garner is a 77 y.o. female.   Patient reports she slipped and fell yesterday.  Patient struck her forehead and lacerated her upper lip.  Patient had sutures placed in the emergency department.  Patient also had a CT scan done which did not show any fractures.  Patient reports area of bruising to her face has increased in size today and she has more swelling to her right lower lip.  Patient denies any fever or chills she has not had any drainage from wound  The history is provided by the patient and a relative. No language interpreter was used.    Past Medical History:  Diagnosis Date   Asthma    Cardiomegaly    CHF (congestive heart failure) (HCC)    Cough    Diabetes (HCC)    Dyspnea    History of swelling of feet    Hypertension    SOB (shortness of breath)    Swelling of ankle     Patient Active Problem List   Diagnosis Date Noted   PVC's (premature ventricular contractions) 08/20/2022   Coronary artery disease involving native coronary artery of native heart without angina pectoris 12/30/2021   Chronic heart failure with preserved ejection fraction (HCC) 05/08/2020   Diabetes mellitus with coincident hypertension (HCC) 05/08/2020   Chest pain of uncertain etiology 05/08/2020    History reviewed. No pertinent surgical history.  OB History   No obstetric history on file.      Home Medications    Prior to Admission medications   Medication Sig Start Date End Date Taking? Authorizing Provider  amoxicillin-clavulanate (AUGMENTIN) 875-125 MG tablet Take 1 tablet by mouth 2 (two) times daily. 10/07/22  Yes Cheron Schaumann K, PA-C  albuterol (PROVENTIL) (2.5 MG/3ML) 0.083% nebulizer solution Take 3 mLs (2.5 mg total) by nebulization every 6 (six) hours as needed for wheezing or shortness of  breath. 02/12/22   Kommor, [place], MD  albuterol (VENTOLIN HFA) 108 (90 Base) MCG/ACT inhaler  04/05/21   [provider]  aspirin EC 81 MG tablet Take 81 mg by mouth daily. Swallow whole.    [provider]  atenolol (TENORMIN) 100 MG tablet TAKE 1 TABLET(100 MG) BY MOUTH DAILY 07/03/22   Chandrasekhar, Mahesh A, MD  cetirizine (ZYRTEC) 10 MG tablet Take 1 tablet by mouth daily.    [provider]  Cholecalciferol (VITAMIN D3) 50 MCG (2000 UT) TABS 1 tablet    [provider]  EPINEPHrine 0.3 mg/0.3 mL IJ SOAJ injection as needed for anaphylaxis.    [provider]  FARXIGA 5 MG TABS tablet Take 5 mg by mouth daily. 03/09/22   [provider]  fluticasone Aleda Grana) 50 MCG/ACT nasal spray 1 spray in each nostril 11/07/20   [provider]  furosemide (LASIX) 20 MG tablet Take 1 tablet (20 mg total) by mouth daily. 07/02/22   Chandrasekhar, Rondel Jumbo, MD  guaiFENesin (MUCINEX) 600 MG 12 hr tablet Take by mouth 2 (two) times daily as needed for cough or to loosen phlegm.    [provider]  linaclotide Karlene Einstein) 72 MCG capsule as needed. 05/07/22   [provider]  mometasone-formoterol (DULERA) 100-5 MCG/ACT AERO Inhale 2 puffs into the lungs 2 (two) times daily.  [provider]  montelukast (SINGULAIR) 10 MG tablet Take 10 mg by mouth daily. 04/12/20   [provider]  nitroGLYCERIN (NITROSTAT) 0.4 MG SL tablet Place 1 tablet (0.4 mg total) under the tongue every 5 (five) minutes as needed for chest pain. 08/20/22 11/18/22  Riley Lam A, MD  pantoprazole (PROTONIX) 40 MG tablet Take 40 mg by mouth daily.    [provider]  polyethylene glycol powder (MIRALAX) 17 GM/SCOOP powder as needed for moderate constipation.    [provider]  predniSONE (STERAPRED UNI-PAK 21 TAB) 10 MG (21) TBPK tablet Take by mouth daily. Take 6 tabs by mouth daily  for 2 days, then 5 tabs for 2 days,  then 4 tabs for 2 days, then 3 tabs for 2 days, 2 tabs for 2 days, then 1 tab by mouth daily for 2 days 08/18/22   Tanda Rockers A, DO  rosuvastatin (CRESTOR) 10 MG tablet Take 10 mg by mouth daily.    [provider]  spironolactone (ALDACTONE) 50 MG tablet Take 1 tablet (50 mg total) by mouth daily. 09/30/22   Chandrasekhar, Lafayette Dragon A, MD  sucralfate (CARAFATE) 1 g tablet Take 1 tablet (1 g total) by mouth with breakfast, with lunch, and with evening meal for 7 days. 08/19/22 08/26/22  Sloan Leiter, DO  traMADol (ULTRAM) 50 MG tablet tramadol 50 mg tablet  Take 1 tablet 3 times a day by oral route as needed for pain for 5 days.    [provider]  valsartan (DIOVAN) 160 MG tablet TAKE 1 TABLET(160 MG) BY MOUTH DAILY 04/28/22   Christell Constant, MD    Family History Family History  Problem Relation Age of Onset   Heart disease Sister    Heart disease Brother     Social History Social History   Tobacco Use   Smoking status: Former    Packs/day: 1.00    Years: 22.00    Additional pack years: 0.00    Total pack years: 22.00    Types: Cigarettes    Start date: 1964    Quit date: 1986    Years since quitting: 38.3   Smokeless tobacco: Never  Vaping Use   Vaping Use: Never used  Substance Use Topics   Alcohol use: Never   Drug use: Never     Allergies   Glipizide, Hydroxychloroquine, Ibuprofen, Metformin, Amlodipine, Dulaglutide, Empagliflozin, Metformin and related, Saline nasal spray, Sitagliptin, and Zyrtec [cetirizine]   Review of Systems Review of Systems  All other systems reviewed and are negative.    Physical Exam Triage Vital Signs ED Triage Vitals  Enc Vitals Group     BP 10/07/22 1910 132/84     Pulse Rate 10/07/22 1910 65     Resp 10/07/22 1910 17     Temp 10/07/22 1910 98.5 F (36.9 C)     Temp Source 10/07/22 1910 Oral     SpO2 10/07/22 1910 95 %     Weight --      Height --      Head Circumference --      Peak Flow --       Pain Score 10/07/22 1909 7     Pain Loc --      Pain Edu? --      Excl. in GC? --    No data found.  Updated Vital Signs BP 132/84 (BP Location: Left Arm)   Pulse 65   Temp 98.5 F (36.9 C) (Oral)  Resp 17   SpO2 95%   Visual Acuity Right Eye Distance:   Left Eye Distance:   Bilateral Distance:    Right Eye Near:   Left Eye Near:    Bilateral Near:     Physical Exam Vitals reviewed.  Constitutional:      Appearance: Normal appearance.  HENT:     Head: Normocephalic.     Comments: Right forehead right cheek and around right eye Right upper and lower lip, sutures in place upper lip, no sign of infection Cardiovascular:     Rate and Rhythm: Normal rate.  Pulmonary:     Effort: Pulmonary effort is normal.  Neurological:     Mental Status: She is alert.  Psychiatric:        Mood and Affect: Mood normal.      UC Treatments / Results  Labs (all labs ordered are listed, but only abnormal results are displayed) Labs Reviewed - No data to display  EKG   Radiology CT Cervical Spine Wo Contrast  Result Date: 10/06/2022 CLINICAL DATA:  Trauma EXAM: CT CERVICAL SPINE WITHOUT CONTRAST TECHNIQUE: Multidetector CT imaging of the cervical spine was performed without intravenous contrast. Multiplanar CT image reconstructions were also generated. RADIATION DOSE REDUCTION: This exam was performed according to the departmental dose-optimization program which includes automated exposure control, adjustment of the mA and/or kV according to patient size and/or use of iterative reconstruction technique. COMPARISON:  None Available. FINDINGS: Alignment: Normal. Skull base and vertebrae: No acute fracture. No primary bone lesion or focal pathologic process. Soft tissues and spinal canal: No prevertebral fluid or swelling. No visible canal hematoma. Disc levels: Disc space narrowing with anterior and posterior projecting osteophytes C3-4 through C6-7. Upper chest: Negative. IMPRESSION:  Degenerative changes.  No acute traumatic abnormalities. Electronically Signed   By: Layla Maw M.D.   On: 10/06/2022 20:50   CT Head Wo Contrast  Result Date: 10/06/2022 CLINICAL DATA:  Polytrauma, blunt EXAM: CT HEAD WITHOUT CONTRAST TECHNIQUE: Contiguous axial images were obtained from the base of the skull through the vertex without intravenous contrast. RADIATION DOSE REDUCTION: This exam was performed according to the departmental dose-optimization program which includes automated exposure control, adjustment of the mA and/or kV according to patient size and/or use of iterative reconstruction technique. COMPARISON:  None Available. FINDINGS: Brain: There is periventricular white matter decreased attenuation consistent with small vessel ischemic changes. Ventricles, sulci and cisterns are prominent consistent with age related involutional changes. No acute intracranial hemorrhage, mass effect or shift. No hydrocephalus. Vascular: No hyperdense vessel or unexpected calcification. Skull: Right midline frontal scalp soft tissue swelling consistent with cephalohematoma negative for fracture or focal lesion. Sinuses/Orbits: No acute finding. IMPRESSION: 1. Frontal scalp soft tissue swelling consistent with cephalohematoma. 2. Atrophy and chronic small vessel ischemic changes. 3. No acute intracranial process identified. Electronically Signed   By: Layla Maw M.D.   On: 10/06/2022 20:48    Procedures Procedures (including critical care time)  Medications Ordered in UC Medications - No data to display  Initial Impression / Assessment and Plan / UC Course  I have reviewed the triage vital signs and the nursing notes.  Pertinent labs & imaging results that were available during my care of the patient were reviewed by me and considered in my medical decision making (see chart for details).     MDM: Patient has spreading of hematoma from fall to her face lower lip is swollen and bruised  upper lip is swollen will place patient  on Augmentin for 5 days to help prevent infection.  I do not feel like sutures need to be removed at this time patient is advised to return if any problems Final Clinical Impressions(s) / UC Diagnoses   Final diagnoses:  Laceration of lower lip, initial encounter     Discharge Instructions      Return if any problems.   ED Prescriptions     Medication Sig Dispense Auth. Provider   amoxicillin-clavulanate (AUGMENTIN) 875-125 MG tablet Take 1 tablet by mouth 2 (two) times daily. 10 tablet Elson Areas, [place]      PDMP not reviewed this encounter. An After Visit Summary was printed and given to the patient.    Elson Areas, [place] 10/07/22 [id number]

## 2022-10-07 NOTE — Discharge Instructions (Signed)
Return if any problems.

## 2022-10-07 NOTE — ED Triage Notes (Signed)
Pt presents with c/o upper lip swelling and rt eye pain/soreness. Pt is concerned of possible infection.

## 2022-10-14 DIAGNOSIS — B001 Herpesviral vesicular dermatitis: Secondary | ICD-10-CM | POA: Diagnosis not present

## 2022-10-14 DIAGNOSIS — M549 Dorsalgia, unspecified: Secondary | ICD-10-CM | POA: Diagnosis not present

## 2022-10-14 DIAGNOSIS — Z9181 History of falling: Secondary | ICD-10-CM | POA: Diagnosis not present

## 2022-10-14 DIAGNOSIS — S0083XS Contusion of other part of head, sequela: Secondary | ICD-10-CM | POA: Diagnosis not present

## 2022-10-22 DIAGNOSIS — M48062 Spinal stenosis, lumbar region with neurogenic claudication: Secondary | ICD-10-CM | POA: Diagnosis not present

## 2022-10-22 DIAGNOSIS — M5416 Radiculopathy, lumbar region: Secondary | ICD-10-CM | POA: Diagnosis not present

## 2022-10-22 DIAGNOSIS — M5451 Vertebrogenic low back pain: Secondary | ICD-10-CM | POA: Diagnosis not present

## 2022-10-22 DIAGNOSIS — M5412 Radiculopathy, cervical region: Secondary | ICD-10-CM | POA: Diagnosis not present

## 2022-10-24 DIAGNOSIS — J4540 Moderate persistent asthma, uncomplicated: Secondary | ICD-10-CM | POA: Diagnosis not present

## 2022-10-24 DIAGNOSIS — E1169 Type 2 diabetes mellitus with other specified complication: Secondary | ICD-10-CM | POA: Diagnosis not present

## 2022-10-24 DIAGNOSIS — E785 Hyperlipidemia, unspecified: Secondary | ICD-10-CM | POA: Diagnosis not present

## 2022-10-24 DIAGNOSIS — I10 Essential (primary) hypertension: Secondary | ICD-10-CM | POA: Diagnosis not present

## 2022-10-29 DIAGNOSIS — M542 Cervicalgia: Secondary | ICD-10-CM | POA: Diagnosis not present

## 2022-10-29 DIAGNOSIS — M5412 Radiculopathy, cervical region: Secondary | ICD-10-CM | POA: Diagnosis not present

## 2022-11-04 DIAGNOSIS — M542 Cervicalgia: Secondary | ICD-10-CM | POA: Diagnosis not present

## 2022-11-04 DIAGNOSIS — M5412 Radiculopathy, cervical region: Secondary | ICD-10-CM | POA: Diagnosis not present

## 2022-11-06 DIAGNOSIS — M542 Cervicalgia: Secondary | ICD-10-CM | POA: Diagnosis not present

## 2022-11-06 DIAGNOSIS — M5412 Radiculopathy, cervical region: Secondary | ICD-10-CM | POA: Diagnosis not present

## 2022-11-11 DIAGNOSIS — M5412 Radiculopathy, cervical region: Secondary | ICD-10-CM | POA: Diagnosis not present

## 2022-11-11 DIAGNOSIS — M542 Cervicalgia: Secondary | ICD-10-CM | POA: Diagnosis not present

## 2022-11-18 DIAGNOSIS — M5412 Radiculopathy, cervical region: Secondary | ICD-10-CM | POA: Diagnosis not present

## 2022-11-18 DIAGNOSIS — M542 Cervicalgia: Secondary | ICD-10-CM | POA: Diagnosis not present

## 2022-11-20 DIAGNOSIS — M542 Cervicalgia: Secondary | ICD-10-CM | POA: Diagnosis not present

## 2022-11-20 DIAGNOSIS — M5412 Radiculopathy, cervical region: Secondary | ICD-10-CM | POA: Diagnosis not present

## 2022-11-20 NOTE — Progress Notes (Signed)
Office Visit    Patient Name: Donna Garner Date of Encounter: 11/21/2022  Primary Care Provider:  Shon Hale, MD Primary Cardiologist:  Christell Constant, MD Primary Electrophysiologist: None   Past Medical History    Past Medical History:  Diagnosis Date   Asthma    Cardiomegaly    CHF (congestive heart failure) (HCC)    Cough    Diabetes (HCC)    Dyspnea    History of swelling of feet    Hypertension    SOB (shortness of breath)    Swelling of ankle    No past surgical history on file.  Allergies  Allergies  Allergen Reactions   Glipizide Other (See Comments) and Rash   Hydroxychloroquine Other (See Comments) and Rash   Ibuprofen Shortness Of Breath and Other (See Comments)   Metformin Other (See Comments)   Amlodipine Swelling   Dulaglutide     Other reaction(s): stomach upset, palpitations   Empagliflozin     Other reaction(s): stomach upset   Metformin And Related    Saline Nasal Spray    Sitagliptin     Other Reaction(s): diarrhea/swelling/nausea   Zyrtec [Cetirizine]      History of Present Illness    Donna Garner  is a 77 year old female with a PMH of nonobstructive CAD, HFpEF, HTN, asthma, DM type II, bronchitis who presents today for 90-month follow-up.  Donna Garner has been followed by Dr. Raynelle Jan since 2021 when she relocated from [place], [place] to [facility]  She underwent a LHC in 2019 prior to her move that was normal.  She has hypertension that has been managed by Pharm.D. and HFpEF that has been managed by Dr. Raynelle Jan with optimal GDMT.  She had a coronary CTA completed due to complaint of chest pain that revealed calcium score of 255 with moderate stenosis in the left circumflex and LAD with FFR indicating nonflow limiting stenosis.  She wore a 7-day ZIO monitor in 04/2022 for complaint of palpitations that was normal.  She was last seen by Dr. Izora Ribas on 08/2022 and had complaints of chest pain with  lower extremity swelling and DOE.  She completed a Lexiscan stress test that was normal and showed no PVCs.  Her blood pressure was controlled at recent visit and PVCs were rarely symptomatic.  She was seen in the ED on 10/06/2022 after suffering a mechanical fall and striking her lip and forehead.  She had a CT of the spine and head that were both normal.  Donna Garner presents today with her daughter for 69-month follow-up.  Since last being seen in the office patient reports she has been doing much better and has experienced less shortness of breath with exertion and has noticed less palpitations or PVCs.  She is compliant with her current medications and denies any adverse reactions.  Her blood pressure today is well-controlled at 118/62 and heart rate is 60 bpm.  She is euvolemic on examination today with trace lower extremity edema present.  She is experiencing neck pain and is under treatment with physical therapy and steroid injections.  During today's visit we discussed the pathophysiology of diastolic heart failure and also reviewed the importance of medication compliance.  She had all questions answered to her satisfaction today.  Patient denies chest pain, palpitations, dyspnea, PND, orthopnea, nausea, vomiting, dizziness, syncope, edema, weight gain, or early satiety.  She underwent a Lexiscan stress test that showed   Home Medications  Current Outpatient Medications  Medication Sig Dispense Refill   albuterol (PROVENTIL) (2.5 MG/3ML) 0.083% nebulizer solution Take 3 mLs (2.5 mg total) by nebulization every 6 (six) hours as needed for wheezing or shortness of breath. 75 mL 12   albuterol (VENTOLIN HFA) 108 (90 Base) MCG/ACT inhaler      aspirin EC 81 MG tablet Take 81 mg by mouth daily. Swallow whole.     atenolol (TENORMIN) 100 MG tablet TAKE 1 TABLET(100 MG) BY MOUTH DAILY 90 tablet 3   cetirizine (ZYRTEC) 10 MG tablet Take 1 tablet by mouth as needed for allergies.     Cholecalciferol  (VITAMIN D3) 50 MCG (2000 UT) TABS 2 (two) times a week.     EPINEPHrine 0.3 mg/0.3 mL IJ SOAJ injection as needed for anaphylaxis.     FARXIGA 5 MG TABS tablet Take 5 mg by mouth daily.     fluticasone (FLONASE) 50 MCG/ACT nasal spray 1 spray in each nostril     furosemide (LASIX) 20 MG tablet Take 1 tablet (20 mg total) by mouth daily. 90 tablet 3   guaiFENesin (MUCINEX) 600 MG 12 hr tablet Take by mouth 2 (two) times daily as needed for cough or to loosen phlegm.     linaclotide (LINZESS) 72 MCG capsule as needed.     mometasone-formoterol (DULERA) 100-5 MCG/ACT AERO Inhale 2 puffs into the lungs 2 (two) times daily.     montelukast (SINGULAIR) 10 MG tablet Take 10 mg by mouth daily.     nitroGLYCERIN (NITROSTAT) 0.4 MG SL tablet Place 1 tablet (0.4 mg total) under the tongue every 5 (five) minutes as needed for chest pain. 90 tablet 3   pantoprazole (PROTONIX) 40 MG tablet Take 40 mg by mouth daily.     polyethylene glycol powder (MIRALAX) 17 GM/SCOOP powder as needed for moderate constipation.     rosuvastatin (CRESTOR) 10 MG tablet Take 10 mg by mouth daily.     spironolactone (ALDACTONE) 50 MG tablet Take 1 tablet (50 mg total) by mouth daily. 90 tablet 3   traMADol (ULTRAM) 50 MG tablet tramadol 50 mg tablet  Take 1 tablet 3 times a day by oral route as needed for pain for 5 days.     valsartan (DIOVAN) 160 MG tablet TAKE 1 TABLET(160 MG) BY MOUTH DAILY 90 tablet 2   No current facility-administered medications for this visit.     Review of Systems  Please see the history of present illness.    (+) Trace lower extremity edema (+) Arthritic neck pain and knee pain  All other systems reviewed and are otherwise negative except as noted above.  Physical Exam    Wt Readings from Last 3 Encounters:  11/21/22 201 lb (91.2 kg)  09/01/22 196 lb (88.9 kg)  08/20/22 196 lb (88.9 kg)   VS: Vitals:   11/21/22 0915  BP: 118/62  Pulse: 60  SpO2: 96%  ,Body mass index is 31.48  kg/m.  Constitutional:      Appearance: Healthy appearance. Not in distress.  Neck:     Vascular: JVD normal.  Pulmonary:     Effort: Pulmonary effort is normal.     Breath sounds: No wheezing. No rales. Diminished in the bases Cardiovascular:     Normal rate. Regular rhythm. Normal S1. Normal S2.      Murmurs: There is no murmur.  Edema:    Peripheral edema absent.  Abdominal:     Palpations: Abdomen is soft non tender. There is no  hepatomegaly.  Skin:    General: Skin is warm and dry.  Neurological:     General: No focal deficit present.     Mental Status: Alert and oriented to person, place and time.     Cranial Nerves: Cranial nerves are intact.  EKG/LABS/ Recent Cardiac Studies    ECG personally reviewed by me today -none completed today     Lab Results  Component Value Date   WBC 9.5 08/18/2022   HGB 14.6 08/18/2022   HCT 44.6 08/18/2022   MCV 87.5 08/18/2022   PLT 208 08/18/2022   Lab Results  Component Value Date   CREATININE 0.93 08/18/2022   BUN 13 08/18/2022   NA 137 08/18/2022   K 3.1 (L) 08/18/2022   CL 105 08/18/2022   CO2 21 (L) 08/18/2022   Lab Results  Component Value Date   ALT 22 02/12/2022   AST 23 02/12/2022   ALKPHOS 34 (L) 02/12/2022   BILITOT 0.5 02/12/2022   No results found for: "CHOL", "HDL", "LDLCALC", "LDLDIRECT", "TRIG", "CHOLHDL"  No results found for: "HGBA1C"   Assessment & Plan    1.  Nonobstructive CAD: -Patient reports no chest pain or anginal equivalent since previous visit. -Continue GDMT with ASA 81 mg, Crestor 10 mg  2.  Chest pain: -Patient denies chest pain as noted above -Continue current GDMT  3.  HFpEF: -Patient's last 2D echo completed in 2021 with EF of 65 to 70% with no RWMA and severe LVH and grade 1 DD with trivial AVR and MVR. -Patient is euvolemic on exam today and reports less dyspnea on exertion with activity. -Repeat 2D echo for surveillance -Continue GDMT with atenolol 100 mg, Farxiga 5 mg,  Lasix 20 mg, spironolactone 50 mg, and valsartan 160 mg daily -Patient can take additional 20 mg of Lasix as needed for increased weight gain and shortness of breath. -Low sodium diet, fluid restriction <2L, and daily weights encouraged. Educated to contact our office for weight gain of 2 lbs overnight or 5 lbs in one week.   4.  Essential hypertension: -Patient's blood pressure today is well-controlled at 118/62 -Continue valsartan 1 to 60 mg daily, Aldactone 50 mg daily, atenolol 100 mg daily  5.  DM type II: -Patient's last hemoglobin A1c was 7.5 above goal. -Continue Farxiga 5 mg patient advised to watch calorie intake as well as carbohydrates.   Disposition: Follow-up with Christell Constant, MD or APP in 6 months   Medication Adjustments/Labs and Tests Ordered: Current medicines are reviewed at length with the patient today.  Concerns regarding medicines are outlined above.   Signed, Napoleon Form, Leodis Rains, NP 11/21/2022, 9:37 AM [facility]

## 2022-11-21 ENCOUNTER — Encounter: Payer: Self-pay | Admitting: Nurse Practitioner

## 2022-11-21 ENCOUNTER — Ambulatory Visit: Payer: Medicare Other | Attending: Nurse Practitioner | Admitting: Nurse Practitioner

## 2022-11-21 VITALS — BP 118/62 | HR 60 | Ht 67.0 in | Wt 201.0 lb

## 2022-11-21 DIAGNOSIS — Z7984 Long term (current) use of oral hypoglycemic drugs: Secondary | ICD-10-CM

## 2022-11-21 DIAGNOSIS — I5032 Chronic diastolic (congestive) heart failure: Secondary | ICD-10-CM | POA: Diagnosis not present

## 2022-11-21 DIAGNOSIS — I10 Essential (primary) hypertension: Secondary | ICD-10-CM

## 2022-11-21 DIAGNOSIS — E119 Type 2 diabetes mellitus without complications: Secondary | ICD-10-CM

## 2022-11-21 DIAGNOSIS — I2510 Atherosclerotic heart disease of native coronary artery without angina pectoris: Secondary | ICD-10-CM | POA: Diagnosis not present

## 2022-11-21 DIAGNOSIS — R079 Chest pain, unspecified: Secondary | ICD-10-CM

## 2022-11-21 MED ORDER — FUROSEMIDE 20 MG PO TABS: 20.0000 mg | ORAL_TABLET | ORAL | 1 refills | Status: DC | PRN

## 2022-11-21 NOTE — Patient Instructions (Addendum)
Medication Instructions:  START Lasix 20mg  Take 1 tablet as needed for weight gain of 2-3 lbs per day or 5 lbs in a week *If you need a refill on your cardiac medications before your next appointment, please call your pharmacy*   Lab Work: None ordered   Testing/Procedures: Your physician has requested that you have an echocardiogram. Echocardiography is a painless test that uses sound waves to create images of your heart. It provides your doctor with information about the size and shape of your heart and how well your heart's chambers and valves are working. This procedure takes approximately one hour. There are no restrictions for this procedure. Please do NOT wear cologne, perfume, aftershave, or lotions (deodorant is allowed). Please arrive 15 minutes prior to your appointment time.   Follow-Up: At [facility], you and your health needs are our priority.  As part of our continuing mission to provide you with exceptional heart care, we have created designated Provider Care Teams.  These Care Teams include your primary Cardiologist (physician) and Advanced Practice Providers (APPs -  Physician Assistants and Nurse Practitioners) who all work together to provide you with the care you need, when you need it.  We recommend signing up for the patient portal called "MyChart".  Sign up information is provided on this After Visit Summary.  MyChart is used to connect with patients for Virtual Visits (Telemedicine).  Patients are able to view lab/test results, encounter notes, upcoming appointments, etc.  Non-urgent messages can be sent to your provider as well.   To learn more about what you can do with MyChart, go to [email or web address].    Your next appointment:   6 month(s)  Provider:   Robin Searing, NP       Other Instructions Please check your weight daily. Please contact the office if you gain more than 3lbs in a day or 5lbs in a week. Limit your salt intake to 1500-2000mg   per day or 500mg  of Sodium per meal.

## 2022-11-25 DIAGNOSIS — M542 Cervicalgia: Secondary | ICD-10-CM | POA: Diagnosis not present

## 2022-11-25 DIAGNOSIS — M5412 Radiculopathy, cervical region: Secondary | ICD-10-CM | POA: Diagnosis not present

## 2022-12-03 DIAGNOSIS — R059 Cough, unspecified: Secondary | ICD-10-CM | POA: Diagnosis not present

## 2022-12-03 DIAGNOSIS — Z03818 Encounter for observation for suspected exposure to other biological agents ruled out: Secondary | ICD-10-CM | POA: Diagnosis not present

## 2022-12-03 DIAGNOSIS — E1165 Type 2 diabetes mellitus with hyperglycemia: Secondary | ICD-10-CM | POA: Diagnosis not present

## 2022-12-03 DIAGNOSIS — J45901 Unspecified asthma with (acute) exacerbation: Secondary | ICD-10-CM | POA: Diagnosis not present

## 2022-12-05 DIAGNOSIS — M1711 Unilateral primary osteoarthritis, right knee: Secondary | ICD-10-CM | POA: Diagnosis not present

## 2022-12-08 ENCOUNTER — Other Ambulatory Visit: Payer: Self-pay | Admitting: Family Medicine

## 2022-12-08 DIAGNOSIS — M5412 Radiculopathy, cervical region: Secondary | ICD-10-CM | POA: Diagnosis not present

## 2022-12-08 DIAGNOSIS — M542 Cervicalgia: Secondary | ICD-10-CM | POA: Diagnosis not present

## 2022-12-08 DIAGNOSIS — Z1231 Encounter for screening mammogram for malignant neoplasm of breast: Secondary | ICD-10-CM

## 2022-12-12 DIAGNOSIS — M542 Cervicalgia: Secondary | ICD-10-CM | POA: Diagnosis not present

## 2022-12-12 DIAGNOSIS — M5412 Radiculopathy, cervical region: Secondary | ICD-10-CM | POA: Diagnosis not present

## 2022-12-15 ENCOUNTER — Ambulatory Visit (HOSPITAL_COMMUNITY): Payer: Medicare Other

## 2022-12-16 DIAGNOSIS — M542 Cervicalgia: Secondary | ICD-10-CM | POA: Diagnosis not present

## 2022-12-16 DIAGNOSIS — M5412 Radiculopathy, cervical region: Secondary | ICD-10-CM | POA: Diagnosis not present

## 2022-12-24 DIAGNOSIS — M542 Cervicalgia: Secondary | ICD-10-CM | POA: Diagnosis not present

## 2022-12-24 DIAGNOSIS — M5412 Radiculopathy, cervical region: Secondary | ICD-10-CM | POA: Diagnosis not present

## 2023-01-06 ENCOUNTER — Ambulatory Visit (HOSPITAL_COMMUNITY): Payer: Medicare Other | Attending: Cardiovascular Disease

## 2023-01-06 DIAGNOSIS — R079 Chest pain, unspecified: Secondary | ICD-10-CM | POA: Diagnosis not present

## 2023-01-06 DIAGNOSIS — I5032 Chronic diastolic (congestive) heart failure: Secondary | ICD-10-CM | POA: Insufficient documentation

## 2023-01-06 DIAGNOSIS — E119 Type 2 diabetes mellitus without complications: Secondary | ICD-10-CM | POA: Diagnosis not present

## 2023-01-06 DIAGNOSIS — I10 Essential (primary) hypertension: Secondary | ICD-10-CM | POA: Diagnosis not present

## 2023-01-06 DIAGNOSIS — I2510 Atherosclerotic heart disease of native coronary artery without angina pectoris: Secondary | ICD-10-CM | POA: Diagnosis not present

## 2023-01-06 LAB — ECHOCARDIOGRAM COMPLETE
Area-P 1/2: 2.81 cm2
S' Lateral: 2.45 cm

## 2023-01-22 ENCOUNTER — Other Ambulatory Visit: Payer: Self-pay | Admitting: Internal Medicine

## 2023-01-22 DIAGNOSIS — I10 Essential (primary) hypertension: Secondary | ICD-10-CM

## 2023-02-05 ENCOUNTER — Telehealth: Payer: Self-pay | Admitting: Internal Medicine

## 2023-02-05 NOTE — Telephone Encounter (Signed)
Spoke with pt daughter reports pt has experienced a consistent feeling that heart is beating hard.  First noted after July Echo. Does not notice a specific pattern to feelings.   Reports BP consistently in the 120's: 121/79-63, 127/77-61. Reports adequate fluid intake. Denies feeling that heart is beating irregularly; no feelings of skipped beats or fluttering.  Reports small amount of swelling to feet and ankles.  Reports swelling is better since [place] was increased to 10 mg.  Denies pain with deep breath.  Notes that heart beats harder after breathing treatment. Pt takes albuterol as needed.  Advised this can cause heart to feel as if beating different.  Reports pt has taken this medication for awhile without difficulty.   Echo July 2024 nothing of concern.  Lexiscan from March 2024 WNL.  Advised will send to provider to advise.

## 2023-02-05 NOTE — Telephone Encounter (Signed)
STAT if HR is under 50 or over 120 (normal HR is 60-100 beats per minute)  What is your heart rate? 60's  Do you have a log of your heart rate readings (document readings)? No, typically states in the 60's per patient's daughter.  Do you have any other symptoms? Patient's daughter stated that the patient will feel her heart beat really hard. Patient's daughter stated her heart rate will stay in the 60's

## 2023-02-06 ENCOUNTER — Telehealth: Payer: Self-pay | Admitting: Internal Medicine

## 2023-02-06 NOTE — Telephone Encounter (Signed)
Called pt daughter advised of providers response.  Pt also advised to monitor how pt feels after taking breathing treatments.  If they are causing heart to beat harder notify prescribing provider.  Also advised to monitor weight and if pt starts having SOB pt to take furosemide as needed.  Daughter had no further questions or concerns.

## 2023-02-06 NOTE — Telephone Encounter (Signed)
Left a message to call back.

## 2023-02-06 NOTE — Telephone Encounter (Signed)
Daughter Lauris Poag) returned RN's call.

## 2023-02-12 NOTE — Telephone Encounter (Signed)
Will close this encounter.  Please see encounter from 02/05/23 for more information.

## 2023-02-16 ENCOUNTER — Ambulatory Visit
Admission: RE | Admit: 2023-02-16 | Discharge: 2023-02-16 | Disposition: A | Discharge status: Home / Self Care | Payer: Medicare Other | Source: Ambulatory Visit | Attending: Family Medicine

## 2023-02-16 DIAGNOSIS — Z1231 Encounter for screening mammogram for malignant neoplasm of breast: Secondary | ICD-10-CM

## 2023-02-18 ENCOUNTER — Other Ambulatory Visit: Payer: Self-pay | Admitting: Family Medicine

## 2023-02-18 DIAGNOSIS — R928 Other abnormal and inconclusive findings on diagnostic imaging of breast: Secondary | ICD-10-CM

## 2023-02-23 DIAGNOSIS — Z5181 Encounter for therapeutic drug level monitoring: Secondary | ICD-10-CM | POA: Diagnosis not present

## 2023-02-23 DIAGNOSIS — J45901 Unspecified asthma with (acute) exacerbation: Secondary | ICD-10-CM | POA: Diagnosis not present

## 2023-02-23 DIAGNOSIS — M5412 Radiculopathy, cervical region: Secondary | ICD-10-CM | POA: Diagnosis not present

## 2023-02-23 DIAGNOSIS — M5416 Radiculopathy, lumbar region: Secondary | ICD-10-CM | POA: Diagnosis not present

## 2023-02-23 DIAGNOSIS — Z03818 Encounter for observation for suspected exposure to other biological agents ruled out: Secondary | ICD-10-CM | POA: Diagnosis not present

## 2023-02-23 DIAGNOSIS — M25561 Pain in right knee: Secondary | ICD-10-CM | POA: Diagnosis not present

## 2023-02-23 DIAGNOSIS — R051 Acute cough: Secondary | ICD-10-CM | POA: Diagnosis not present

## 2023-02-23 DIAGNOSIS — Z79899 Other long term (current) drug therapy: Secondary | ICD-10-CM | POA: Diagnosis not present

## 2023-02-23 DIAGNOSIS — M5451 Vertebrogenic low back pain: Secondary | ICD-10-CM | POA: Diagnosis not present

## 2023-02-25 ENCOUNTER — Ambulatory Visit
Admission: RE | Admit: 2023-02-25 | Discharge: 2023-02-25 | Disposition: A | Discharge status: Home / Self Care | Payer: Medicare Other | Source: Ambulatory Visit | Attending: Family Medicine | Admitting: Family Medicine

## 2023-02-25 ENCOUNTER — Other Ambulatory Visit: Payer: Self-pay | Admitting: Family Medicine

## 2023-02-25 DIAGNOSIS — N6322 Unspecified lump in the left breast, upper inner quadrant: Secondary | ICD-10-CM | POA: Diagnosis not present

## 2023-02-25 DIAGNOSIS — R928 Other abnormal and inconclusive findings on diagnostic imaging of breast: Secondary | ICD-10-CM

## 2023-02-25 DIAGNOSIS — N6320 Unspecified lump in the left breast, unspecified quadrant: Secondary | ICD-10-CM

## 2023-03-02 ENCOUNTER — Ambulatory Visit
Admission: RE | Admit: 2023-03-02 | Discharge: 2023-03-02 | Disposition: A | Discharge status: Home / Self Care | Payer: Medicare Other | Source: Ambulatory Visit | Attending: Family Medicine | Admitting: Family Medicine

## 2023-03-02 DIAGNOSIS — N6322 Unspecified lump in the left breast, upper inner quadrant: Secondary | ICD-10-CM | POA: Diagnosis not present

## 2023-03-02 DIAGNOSIS — N6320 Unspecified lump in the left breast, unspecified quadrant: Secondary | ICD-10-CM

## 2023-03-02 DIAGNOSIS — R921 Mammographic calcification found on diagnostic imaging of breast: Secondary | ICD-10-CM | POA: Diagnosis not present

## 2023-03-02 HISTORY — PX: BREAST BIOPSY: SHX20

## 2023-03-03 LAB — SURGICAL PATHOLOGY

## 2023-03-12 DIAGNOSIS — M5416 Radiculopathy, lumbar region: Secondary | ICD-10-CM | POA: Diagnosis not present

## 2023-03-26 DIAGNOSIS — I5032 Chronic diastolic (congestive) heart failure: Secondary | ICD-10-CM | POA: Diagnosis not present

## 2023-03-26 DIAGNOSIS — J4540 Moderate persistent asthma, uncomplicated: Secondary | ICD-10-CM | POA: Diagnosis not present

## 2023-03-26 DIAGNOSIS — E1169 Type 2 diabetes mellitus with other specified complication: Secondary | ICD-10-CM | POA: Diagnosis not present

## 2023-03-26 DIAGNOSIS — E119 Type 2 diabetes mellitus without complications: Secondary | ICD-10-CM | POA: Diagnosis not present

## 2023-03-26 DIAGNOSIS — E261 Secondary hyperaldosteronism: Secondary | ICD-10-CM | POA: Diagnosis not present

## 2023-03-26 DIAGNOSIS — J439 Emphysema, unspecified: Secondary | ICD-10-CM | POA: Diagnosis not present

## 2023-03-26 DIAGNOSIS — E559 Vitamin D deficiency, unspecified: Secondary | ICD-10-CM | POA: Diagnosis not present

## 2023-04-21 ENCOUNTER — Telehealth: Payer: Self-pay | Admitting: *Deleted

## 2023-04-21 ENCOUNTER — Telehealth: Payer: Self-pay | Admitting: Internal Medicine

## 2023-04-21 NOTE — Telephone Encounter (Signed)
Pt c/o medication issue:  1. Name of Medication: furosemide (LASIX) 20 MG tablet (Expired)    2. How are you currently taking this medication (dosage and times per day)?  Take 1 tablet (20 mg total) by mouth as needed for edema (gain 2-3 lbs in a day)  3. Are you having a reaction (difficulty breathing--STAT)? No   4. What is your medication issue? Manufacturer has changed type of pills from [employer]

## 2023-04-21 NOTE — Telephone Encounter (Signed)
  Pt's daughter calling back, she said, she called all pt's pharmacy and none of it carrying Furosemide

## 2023-04-21 NOTE — Telephone Encounter (Signed)
Operator Donna Garner that the pt was calling to s/w me. I reviewed the chart and do not see where I called the pt. I then did call the pt to see if I could help. I see there is a note about a medication. Pt says her daughter Donna Garner was trying to reach Dr. Izora Ribas nurse. I said I will send a message to the RN to call her daughter. Pt said thank you.

## 2023-04-21 NOTE — Telephone Encounter (Signed)
Please see encounter from today 04/21/23 titled Medication Management.

## 2023-04-21 NOTE — Telephone Encounter (Signed)
Called daughter who wants to know if Dr. Izora Ribas knows any pharmacies that care Furosemide manufactured by [employer].  I advised daughter to reach out to different pharmacies to see if they carry this particular manufacture.   Daughter expresses understanding.  Concerned that other manufactures of furosemide are no effective for her mother she has leg swelling.  Advised if unable to locate a  manufacture that is effective for pt to call back and I will send message to MD for possible med change.

## 2023-04-22 MED ORDER — TORSEMIDE 10 MG PO TABS: 10.0000 mg | ORAL_TABLET | Freq: Every day | ORAL | 11 refills | Status: DC

## 2023-04-22 NOTE — Telephone Encounter (Signed)
Left a message to call back.

## 2023-04-22 NOTE — Addendum Note (Signed)
Addended by: Macie Burows on: 04/22/2023 02:09 PM   Modules accepted: Orders

## 2023-04-22 NOTE — Telephone Encounter (Signed)
Called daughter advised I spoke with Dr. Izora Ribas he will change furosemide to Torsemide 10 mg PO . Asked that pt or daughter call in if swelling increases or wt goes up 2-3 lbs in 24 hr or 5 lbs in a week.Daughter expresses understanding.

## 2023-04-22 NOTE — Telephone Encounter (Signed)
Patient's daughter is returning call.

## 2023-05-01 DIAGNOSIS — H9312 Tinnitus, left ear: Secondary | ICD-10-CM | POA: Diagnosis not present

## 2023-05-15 ENCOUNTER — Encounter (INDEPENDENT_AMBULATORY_CARE_PROVIDER_SITE_OTHER): Payer: Self-pay | Admitting: Otolaryngology

## 2023-06-02 DIAGNOSIS — M170 Bilateral primary osteoarthritis of knee: Secondary | ICD-10-CM | POA: Diagnosis not present

## 2023-06-11 ENCOUNTER — Ambulatory Visit (INDEPENDENT_AMBULATORY_CARE_PROVIDER_SITE_OTHER): Payer: Medicare Other | Admitting: Otolaryngology

## 2023-06-11 ENCOUNTER — Ambulatory Visit (INDEPENDENT_AMBULATORY_CARE_PROVIDER_SITE_OTHER): Payer: Medicare Other | Admitting: Audiology

## 2023-06-11 ENCOUNTER — Encounter (INDEPENDENT_AMBULATORY_CARE_PROVIDER_SITE_OTHER): Payer: Self-pay

## 2023-06-11 VITALS — BP 134/83 | HR 72 | Resp 19 | Ht 67.0 in | Wt 201.0 lb

## 2023-06-11 DIAGNOSIS — H6121 Impacted cerumen, right ear: Secondary | ICD-10-CM | POA: Diagnosis not present

## 2023-06-11 DIAGNOSIS — H9312 Tinnitus, left ear: Secondary | ICD-10-CM | POA: Diagnosis not present

## 2023-06-11 DIAGNOSIS — H93292 Other abnormal auditory perceptions, left ear: Secondary | ICD-10-CM

## 2023-06-11 DIAGNOSIS — H903 Sensorineural hearing loss, bilateral: Secondary | ICD-10-CM

## 2023-06-11 DIAGNOSIS — H93291 Other abnormal auditory perceptions, right ear: Secondary | ICD-10-CM

## 2023-06-11 NOTE — Progress Notes (Signed)
 Dear Dr. Chrystal, Here is my assessment for our mutual patient, Donna Garner. Thank you for allowing me the opportunity to care for your patient. Please do not hesitate to contact me should you have any other questions. Sincerely, Dr. Eldora Blanch  Otolaryngology Clinic Note Referring provider: Dr. Chrystal HPI:  Donna Garner is a 78 y.o. female kindly referred by Dr. Chrystal for evaluation of left tinnitus.   Initial visit (06/2023): Patient reports: Donna Garner in April 2024, and then had some bilateral tinnitus. Resolved, and then 3 months later she started to have left tinnitus. Intermittent. Associated with posterior neck pain - when her neck does not hurt, her tinnitus is not present. Not particular bothersome. Hissing/buzzing sound - can be high or low pitch. No heartbeat/pulsatile sound. Not relieved with neck compression. Can't hear eyes/footstep echo. Hearing without decline or issue. Has started Rybelsus and farxiga dose has been increased since. She is also on tramadol for pain. Otherwise no changes Patient denies: ear pain, fullness, vertigo, drainage Patient additionally denies: deep pain in ear canal, eustachian tube symptoms such as popping, crackling, sensitive to pressure changes Patient also denies vestibular suppressant use, ototoxic medication use Prior ear surgery: no Ears don't bother her normally  H&N Surgery: no Personal or FHx of bleeding dz or anesthesia difficulty: no   GLP-1: ozempic (now off) AP/AC: ASA 81  Tobacco: 1 year, quit 1986. Lives in [place], [place]  PMHx: HTN, GAD, T2DM, HLD, Atherosclerosis, DDD, Hyperaldosteronism, CKD, HFpEF, Neck arthritis  Independent Review of Additional Tests or Records:  Donna Garner (05/01/2023) referral notes reviewed and uploaded or available in chart in media tab: then had a fall, subsequent tinnitus; had L ear tinnitus afterwards, then stopped. Maybe started after ozempic? Not pulsatile. Rx: d/c ozempic, ref  ENT CT Head 10/06/2022 independently reviewed mastoids and ME well aerated; cuts thick so no definitive determination but no obvious Temporal bone fractures or otic capsule abnormalities; no  MRI Brain 11/06/2021: attention to ears; no mastoid effusion; no retrocochlear lesions noted, but no contrast CBC w/diff 08/18/2022: no leukocytosis 06/2023 Audiogram was independently reviewed and interpreted by me and it reveals AU: essentially normal hearing with mild HL at 4000 Hz, WRT as below, symmetric; A/A tymps    SNHL= Sensorineural hearing loss   PMH/Meds/All/SocHx/FamHx/ROS:   Past Medical History:  Diagnosis Date   Asthma    Cardiomegaly    CHF (congestive heart failure) (HCC)    Cough    Diabetes (HCC)    Dyspnea    History of swelling of feet    Hypertension    SOB (shortness of breath)    Swelling of ankle      Past Surgical History:  Procedure Laterality Date   BREAST BIOPSY Left 03/02/2023   MM LT BREAST BX W LOC DEV 1ST LESION IMAGE BX SPEC STEREO GUIDE 03/02/2023 [facility]    Family History  Problem Relation Age of Onset   Heart disease Sister    Heart disease Brother      Social Connections: Not on file      Current Outpatient Medications:    albuterol  (PROVENTIL ) (2.5 MG/3ML) 0.083% nebulizer solution, Take 3 mLs (2.5 mg total) by nebulization every 6 (six) hours as needed for wheezing or shortness of breath., Disp: 75 mL, Rfl: 12   albuterol  (VENTOLIN  HFA) 108 (90 Base) MCG/ACT inhaler, , Disp: , Rfl:    aspirin EC 81 MG tablet, Take 81 mg by mouth daily. Swallow whole., Disp: , Rfl:  atenolol  (TENORMIN ) 100 MG tablet, TAKE 1 TABLET(100 MG) BY MOUTH DAILY, Disp: 90 tablet, Rfl: 3   cetirizine (ZYRTEC) 10 MG tablet, Take 1 tablet by mouth as needed for allergies., Disp: , Rfl:    Cholecalciferol (VITAMIN D3) 50 MCG (2000 UT) TABS, 2 (two) times a week., Disp: , Rfl:    EPINEPHrine  0.3 mg/0.3 mL IJ SOAJ injection, as needed for anaphylaxis., Disp: ,  Rfl:    FARXIGA 10 MG TABS tablet, Take 10 mg by mouth daily., Disp: , Rfl:    fluticasone (FLONASE) 50 MCG/ACT nasal spray, 1 spray in each nostril, Disp: , Rfl:    guaiFENesin  (MUCINEX ) 600 MG 12 hr tablet, Take by mouth 2 (two) times daily as needed for cough or to loosen phlegm., Disp: , Rfl:    linaclotide (LINZESS) 72 MCG capsule, as needed., Disp: , Rfl:    mometasone-formoterol (DULERA) 100-5 MCG/ACT AERO, Inhale 2 puffs into the lungs 2 (two) times daily., Disp: , Rfl:    montelukast (SINGULAIR) 10 MG tablet, Take 10 mg by mouth daily., Disp: , Rfl:    pantoprazole (PROTONIX) 40 MG tablet, Take 40 mg by mouth daily., Disp: , Rfl:    polyethylene glycol powder (MIRALAX) 17 GM/SCOOP powder, as needed for moderate constipation., Disp: , Rfl:    rosuvastatin (CRESTOR) 10 MG tablet, Take 10 mg by mouth daily., Disp: , Rfl:    RYBELSUS 3 MG TABS, Take 1 tablet by mouth daily., Disp: , Rfl:    spironolactone  (ALDACTONE ) 50 MG tablet, Take 1 tablet (50 mg total) by mouth daily., Disp: 90 tablet, Rfl: 3   torsemide  (DEMADEX ) 10 MG tablet, Take 1 tablet (10 mg total) by mouth daily., Disp: 30 tablet, Rfl: 11   traMADol (ULTRAM) 50 MG tablet, tramadol 50 mg tablet  Take 1 tablet 3 times a day by oral route as needed for pain for 5 days., Disp: , Rfl:    valsartan  (DIOVAN ) 160 MG tablet, Take 1 tablet (160 mg total) by mouth daily., Disp: 90 tablet, Rfl: 3   FARXIGA 5 MG TABS tablet, Take 5 mg by mouth daily., Disp: , Rfl:    nitroGLYCERIN  (NITROSTAT ) 0.4 MG SL tablet, Place 1 tablet (0.4 mg total) under the tongue every 5 (five) minutes as needed for chest pain., Disp: 90 tablet, Rfl: 3   Physical Exam:   BP 134/83 (BP Location: Right Arm, Patient Position: Sitting, Cuff Size: Normal)   Pulse 72   Resp 19   Ht 5' 7 (1.702 m)   Wt 201 lb (91.2 kg)   SpO2 96%   BMI 31.48 kg/m   Salient findings:  CN II-XII intact Noted right ear cerumen impaction. Given history and complaints and need for  comprehensive exam, ear microscopy was indicated and performed for evaluation with findings as below in physical exam section and in procedures. After clearance of cerumen, Bilateral EAC clear and TM intact with well pneumatized middle ear spaces Weber 512: mid Rinne 512: AC > BC b/l Anterior rhinoscopy: Septum relatively midline; bilateral inferior turbinates without significant hypertrophy No lesions of oral cavity/oropharynx; some posterior neck tenderness left (around midline); no crepitus No audible objective pulsatile tinnitus; no bruits No obviously palpable neck masses/lymphadenopathy/thyromegaly No respiratory distress or stridor Norma; right cerumen impaction  Seprately Identifiable Procedures:  Procedure: Bilateral ear microscopy and right ear cerumen removal using microscope (CPT [id number]) - Mod 25 Pre-procedure diagnosis: Cerumen impaction Right external ears Post-procedure diagnosis: same Indication: right ear cerumen impaction; given patient's otologic  complaints and history as well as for improved and comprehensive examination of external ear and tympanic membrane, bilateral otologic examination using microscope was performed and impacted cerumen removed  Procedure: Patient was placed semi-recumbent. Both ear canals were examined using the microscope with findings above.Impacted Cerumen removed on on right using suction and currette with improvement in EAC examination and patency. See above for findings Patient tolerated the procedure well.      Impression & Plans:  Donna Garner is a 78 y.o. female with history of cervical spine pain on tramadol and ASA 81 now with:  1. Left-sided tinnitus   2. Impacted cerumen of right ear   3. Sensorineural hearing loss (SNHL) of both ears    Right ear cerumen impaction cleared; cerumen strategies discussed Tinnitus: Started after a fall; CT and MRI negative; associated temporally with neck pain from her cervical spine, which she is  working on treating. No right tinnitus. Not pulsatile. Audio generally reassuring with symmetric hearing thresholds. 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 She is already being treated for this so starting there seems reasonable step. It is intermittent and unilateral but has not lasted > 6 months consistently, so will hold off on any imaging, especially with prior CT and MRI recently. On ASA and tramadol which can cause this but think less likely here. She will talk to her PCP re: ASA discontinuance but still think C-spine most likely cause Can try flavonoids as well and masking strategies Hearing loss: chronic - will observe, do not think amplification needed here  - f/u 6 months for recheck  See below regarding exact medications prescribed this encounter including dosages and route: No orders of the defined types were placed in this encounter.     Thank you for allowing me the opportunity to care for your patient. Please do not hesitate to contact me should you have any other questions.  Sincerely, Eldora Blanch, MD Otolarynoglogist (ENT), [facility] Phone: [phone] Fax: [phone]  06/13/2023, 8:06 AM   MDM:  Level 4 Complexity/Problems addressed: mod - multiple chronic problems Data complexity: mod - independent review of notes, labs; ordering tests (audio); independent review of CT and MRI imaging - Morbidity: low - Prescription Drug prescribed or managed: no

## 2023-06-11 NOTE — Patient Instructions (Signed)
 Flavonoids If it is bothersome at night, can try a white noise machine

## 2023-06-11 NOTE — Progress Notes (Signed)
  [address] [place], [place] [address] [phone]  Audiological Evaluation    Name: Donna Garner     DOB:   05-May-1946      MRN:   [id number]                                                                                     Service Date: 06/11/2023     Accompanied by: daughter   Patient comes today after Dr. Tobie, ENT sent a referral for a hearing evaluation due to concerns with hearing loss.   Symptoms Yes Details  Hearing loss  []  Not perceived  Tinnitus  [x]  Tinnitus with onset after she fell  3 months ago. It was on both ears and later went away. Now she reports it returned only in her left ear and is intermittent.   Ear pain/ Ear infections  []    Balance problems  [x]  Lightheaded- maybe when standing up too quick  Noise exposure  []    Previous ear surgeries  []    Family history  []    Amplification  []    Other  [x]  Reports may be bothered by loud sounds.    Otoscopy: Right ear: Clear external ear canals and notable landmarks visualized on the tympanic membrane. Left ear:  Clear external ear canals and notable landmarks visualized on the tympanic membrane.  Tympanometry: Right ear: Type A- Normal external ear canal volume with normal middle ear pressure and tympanic membrane compliance Left ear: Type A- Normal external ear canal volume with normal middle ear pressure and tympanic membrane compliance  Pure tone Audiometry:   Normal to borderline normal hearing from [phone] Hz, in both ears  The hearing test results were completed under headphones and re-checked with inserts and results are deemed to be of good reliability. Test technique:  conventional     Speech Audiometry: Right ear- Speech Reception Threshold (SRT) was obtained at 15 dBHL Left ear-Speech Reception Threshold (SRT) was obtained at 10 dBHL   Word Recognition Score Tested using NU-6 (MLV) Right ear: 96% was obtained at a presentation level of 50 dBHL with contralateral masking  which is deemed as  excellent Left ear: 92% was obtained at a presentation level of 50 dBHL with contralateral masking which is deemed as  excellent     Recommendations: Follow up with ENT as scheduled for today. Return for a hearing evaluation if concerns with hearing changes arise or per MD recommendation. Consider various tinnitus strategies, including the use of a sound generator, and/or tinnitus retraining therapy.    Hani Campusano MARIE LEROUX-MARTINEZ, AUD

## 2023-06-15 ENCOUNTER — Other Ambulatory Visit: Payer: Self-pay | Admitting: Nurse Practitioner

## 2023-06-15 ENCOUNTER — Telehealth: Payer: Self-pay | Admitting: Nurse Practitioner

## 2023-06-15 ENCOUNTER — Other Ambulatory Visit: Payer: Self-pay | Admitting: Internal Medicine

## 2023-06-15 NOTE — Telephone Encounter (Signed)
 *  STAT* If patient is at the pharmacy, call can be transferred to refill team.   1. Which medications need to be refilled? (please list name of each medication and dose if known) atenolol  (TENORMIN ) 100 MG tablet    2. Would you like to learn more about the convenience, safety, & potential cost savings by using the [facility]?    3. Are you open to using the [facility] (Type [facility].    4. Which pharmacy/location (including street and city if local pharmacy) is medication to be sent to?  [id number] - [place], [place] - [address] AT [facility] OF [address]     5. Do they need a 30 day or 90 day supply? 90 days  Per pt's daughter they need a 90 days supply because its cheaper to get it that way. They are keeping their February appt with Jackee Alberts. Please resend meds for 90 days supply

## 2023-06-17 NOTE — Telephone Encounter (Signed)
 Daughter called to check on patient's 20 supply request.  Noted patient has appointment on 2/7.

## 2023-06-18 ENCOUNTER — Encounter: Payer: Self-pay | Admitting: Audiology

## 2023-06-18 DIAGNOSIS — M5416 Radiculopathy, lumbar region: Secondary | ICD-10-CM | POA: Diagnosis not present

## 2023-06-25 DIAGNOSIS — M5416 Radiculopathy, lumbar region: Secondary | ICD-10-CM | POA: Diagnosis not present

## 2023-06-29 DIAGNOSIS — E119 Type 2 diabetes mellitus without complications: Secondary | ICD-10-CM | POA: Diagnosis not present

## 2023-06-29 DIAGNOSIS — H524 Presbyopia: Secondary | ICD-10-CM | POA: Diagnosis not present

## 2023-07-13 ENCOUNTER — Other Ambulatory Visit: Payer: Self-pay | Admitting: Nurse Practitioner

## 2023-07-16 NOTE — Progress Notes (Addendum)
 Cardiology Office Note    Patient Name: Verneta GORMAN Gainer Date of Encounter: 07/16/2023  Primary Care Provider:  Chrystal Lamarr GORMAN, MD Primary Cardiologist:  Stanly DELENA Leavens, MD Primary Electrophysiologist: None   Past Medical History    Past Medical History:  Diagnosis Date   Asthma    Cardiomegaly    CHF (congestive heart failure) (HCC)    Cough    Diabetes (HCC)    Dyspnea    History of swelling of feet    Hypertension    SOB (shortness of breath)    Swelling of ankle     History of Present Illness  LUPE HANDLEY  is a 78 year old female with a PMH of nonobstructive CAD, HFpEF, HTN, asthma, DM type II, bronchitis who presents today for 57-month follow-up.  Ms. Wandel was last seen in clinic on 11/21/2022 for 23-month follow-up.  During visit patient reported feeling better with less shortness of breath with exertion.  She also endorsed less palpitations and blood pressure was controlled. She was advised to take an additional dose of Lasix  for volume increase and no medication changes were made at that time.  She was sent for updated 2D echo that showed EF of 60 to 65% with no RWMA and grade 1 DD and mildly elevated PASP at 38.5 mmHg.  She was advised to continue her current medications at that time.  Ms. Benegas presents today for 68-month follow-up with her daughter.  Since her previous visit she presents with a chief complaint of chest pain that started a week ago. The pain is described as sharp and is located under the left breast. The pain is intermittent and seems to worsen with activity and improve with rest. The patient also reports shortness of breath that is exacerbated by movement. In addition to the chest pain and shortness of breath, the patient has been experiencing swelling in the legs for about a week. The swelling improves when the legs are elevated but returns quickly upon standing or walking. The patient also notes that the swelling has been present on the bottom of  the feet, which is a new symptom.  The patient has been taking Rybelsus for management of diabetes.  She reports that her symptoms seemed to have worsen after starting the second bottle of Rybelsus. The patient also takes furosemide , which was recently switched to torsemide . The patient reports no improvement in symptoms since the switch.   Review of Systems  Please see the history of present illness.    All other systems reviewed and are otherwise negative except as noted above.  Physical Exam    Wt Readings from Last 3 Encounters:  06/11/23 201 lb (91.2 kg)  11/21/22 201 lb (91.2 kg)  09/01/22 196 lb (88.9 kg)   CD:Uyzmz were no vitals filed for this visit.,There is no height or weight on file to calculate BMI. GEN: Well nourished, well developed in no acute distress Neck: No JVD; No carotid bruits Pulmonary: Clear to auscultation without rales, wheezing or rhonchi  Cardiovascular: Normal rate. Regular rhythm. Normal S1. Normal S2.   Murmurs: There is no murmur.  ABDOMEN: Soft, non-tender, non-distended EXTREMITIES:  No edema; No deformity   EKG/LABS/ Recent Cardiac Studies   ECG personally reviewed by me today -sinus rhythm with left anterior fascicular block and no acute changes consistent with previous EKG with rate of 69 bpm  Risk Assessment/Calculations:      STOP-Bang Score:  3      Lab  Results  Component Value Date   WBC 9.5 08/18/2022   HGB 14.6 08/18/2022   HCT 44.6 08/18/2022   MCV 87.5 08/18/2022   PLT 208 08/18/2022   Lab Results  Component Value Date   CREATININE 0.93 08/18/2022   BUN 13 08/18/2022   NA 137 08/18/2022   K 3.1 (L) 08/18/2022   CL 105 08/18/2022   CO2 21 (L) 08/18/2022   No results found for: CHOL, HDL, LDLCALC, LDLDIRECT, TRIG, CHOLHDL  No results found for: HGBA1C Assessment & Plan    1.HFpEF: -Patient's last 2D echo completed 11/2022 showing normal EF of 60 to 65% with no RWMA and grade 1 DD with severe LVH and mildly  elevated PASP of 38.5 mmHg -Today patient is volume up on exam with sodium indiscretions noted in her diet. -Increase Torsemide  to 20mg  twice daily for three days then continue 20 mg daily -Continue atenolol  100 mg daily, Farxiga 10 mg daily, spironolactone  50 mg daily, Diovan  160 mg daily -Check BNP today and in one week to monitor renal function and potassium levels. -Advise patient to weigh daily and maintain low salt diet.  2.Nonobstructive CAD: -Patient reports chest discomfort that has occurred with meals and is not associated with physical exertion. -She reports relief of symptoms with good bowel movement. -EKG unchanged from previous. Differential includes ischemia, heart failure exacerbation, and GERD. -Patient advised to contact office if chest pain changes in intensity and duration. -ED precautions discussed and advised regarding chest pain and as needed Nitrostat  prescribed.  3. Essential hypertension: -Patient's blood pressure today is controlled at 120/76 -Continue  valsartan  160 mg daily, spironolactone  50 mg daily, atenolol  100 mg daily  4.DM type II: -Patient's last hemoglobin A1c was 8.7 -Continue current treatment plan per PCP  5.  History of PVCs: -Currently controlled and not symptomatic -Continue atenolol  100 mg daily   Disposition: Follow-up with Stanly DELENA Leavens, MD or APP in 3 months   Signed, Wyn Raddle, Jackee Shove, NP 07/16/2023, 1:15 PM [facility]

## 2023-07-17 ENCOUNTER — Ambulatory Visit: Payer: Medicare Other | Attending: Nurse Practitioner | Admitting: Nurse Practitioner

## 2023-07-17 ENCOUNTER — Encounter: Payer: Self-pay | Admitting: Nurse Practitioner

## 2023-07-17 VITALS — BP 120/76 | HR 69 | Ht 67.0 in | Wt 201.0 lb

## 2023-07-17 DIAGNOSIS — E119 Type 2 diabetes mellitus without complications: Secondary | ICD-10-CM

## 2023-07-17 DIAGNOSIS — I2510 Atherosclerotic heart disease of native coronary artery without angina pectoris: Secondary | ICD-10-CM

## 2023-07-17 DIAGNOSIS — I5032 Chronic diastolic (congestive) heart failure: Secondary | ICD-10-CM | POA: Diagnosis not present

## 2023-07-17 DIAGNOSIS — I10 Essential (primary) hypertension: Secondary | ICD-10-CM | POA: Diagnosis not present

## 2023-07-17 DIAGNOSIS — I493 Ventricular premature depolarization: Secondary | ICD-10-CM

## 2023-07-17 LAB — BASIC METABOLIC PANEL
BUN/Creatinine Ratio: 17 (ref 12–28)
BUN/Creatinine Ratio: 17 (ref 12–28)
BUN: 18 mg/dL (ref 8–27)
BUN: 18 mg/dL (ref 8–27)
CO2: 22 mmol/L (ref 20–29)
CO2: 22 mmol/L (ref 20–29)
Calcium: 10 mg/dL (ref 8.7–10.3)
Calcium: 10.2 mg/dL (ref 8.7–10.3)
Chloride: 100 mmol/L (ref 96–106)
Chloride: 101 mmol/L (ref 96–106)
Creatinine, Ser: 1.03 mg/dL — ABNORMAL HIGH (ref 0.57–1.00)
Creatinine, Ser: 1.07 mg/dL — ABNORMAL HIGH (ref 0.57–1.00)
Glucose: 123 mg/dL — ABNORMAL HIGH (ref 70–99)
Glucose: 123 mg/dL — ABNORMAL HIGH (ref 70–99)
Potassium: 4.4 mmol/L (ref 3.5–5.2)
Potassium: 4.5 mmol/L (ref 3.5–5.2)
Sodium: 137 mmol/L (ref 134–144)
Sodium: 138 mmol/L (ref 134–144)
eGFR: 53 mL/min/{1.73_m2} — ABNORMAL LOW (ref >59)
eGFR: 56 mL/min/{1.73_m2} — ABNORMAL LOW (ref >59)

## 2023-07-17 LAB — PRO B NATRIURETIC PEPTIDE: NT-Pro BNP: <36 pg/mL (ref 0–738)

## 2023-07-17 MED ORDER — NITROGLYCERIN 0.4 MG SL SUBL: 0.4000 mg | SUBLINGUAL_TABLET | SUBLINGUAL | 6 refills | Status: AC | PRN

## 2023-07-17 MED ORDER — TORSEMIDE 20 MG PO TABS: ORAL_TABLET | ORAL | 3 refills | Status: DC

## 2023-07-17 NOTE — Patient Instructions (Addendum)
 Medication Instructions:  Your physician has recommended you make the following change in your medication:  1) INCREASE torsemide  to 20 mg twice daily for 3 days, then take 20 mg once daily 2) TAKE nitroglycerin  as needed for chest pain: Dissolve 1 tablet under the tongue every 5 minutes as needed for chest pain. Max of 3 doses, then 911.   *If you need a refill on your cardiac medications before your next appointment, please call your pharmacy*  Lab Work: TODAY (go to 1st floor, [address]): BMET, BNP In 1 week at [facility]: BMET If you have labs (blood work) drawn today and your tests are completely normal, you will receive your results only by: MyChart Message (if you have MyChart) OR A paper copy in the mail If you have any lab test that is abnormal or we need to change your treatment, we will call you to review the results.  Testing/Procedures: None ordered today.  Follow-Up: At [facility], you and your health needs are our priority.  As part of our continuing mission to provide you with exceptional heart care, we have created designated Provider Care Teams.  These Care Teams include your primary Cardiologist (physician) and Advanced Practice Providers (APPs -  Physician Assistants and Nurse Practitioners) who all work together to provide you with the care you need, when you need it.  Your next appointment:   3 month(s)  The format for your next appointment:   In Person  Provider:   Jackee Alberts, NP   Other Instructions    1st Floor: - Lobby - Registration  - Pharmacy  - Lab - Cafe  2nd Floor: - PV Lab - Diagnostic Testing (echo, CT, nuclear med)  3rd Floor: - Vacant  4th Floor: - TCTS (cardiothoracic surgery) - AFib Clinic - Structural Heart Clinic - Vascular Surgery  - Vascular Ultrasound  5th Floor: - HeartCare Cardiology (general and EP) - Clinical Pharmacy for coumadin, hypertension, lipid, weight-loss medications, and med management  appointments    Valet parking services will be available as well.

## 2023-07-24 ENCOUNTER — Other Ambulatory Visit (HOSPITAL_COMMUNITY): Payer: Self-pay | Admitting: Nurse Practitioner

## 2023-07-24 DIAGNOSIS — I5032 Chronic diastolic (congestive) heart failure: Secondary | ICD-10-CM | POA: Diagnosis not present

## 2023-07-24 DIAGNOSIS — I10 Essential (primary) hypertension: Secondary | ICD-10-CM | POA: Diagnosis not present

## 2023-07-25 LAB — BASIC METABOLIC PANEL
BUN/Creatinine Ratio: 20 (ref 12–28)
BUN: 21 mg/dL (ref 8–27)
CO2: 26 mmol/L (ref 20–29)
Calcium: 10.3 mg/dL (ref 8.7–10.3)
Chloride: 98 mmol/L (ref 96–106)
Creatinine, Ser: 1.04 mg/dL — ABNORMAL HIGH (ref 0.57–1.00)
Glucose: 126 mg/dL — ABNORMAL HIGH (ref 70–99)
Potassium: 3.7 mmol/L (ref 3.5–5.2)
Sodium: 139 mmol/L (ref 134–144)
eGFR: 55 mL/min/{1.73_m2} — ABNORMAL LOW (ref >59)

## 2023-07-27 DIAGNOSIS — I5032 Chronic diastolic (congestive) heart failure: Secondary | ICD-10-CM

## 2023-07-27 DIAGNOSIS — I10 Essential (primary) hypertension: Secondary | ICD-10-CM

## 2023-08-03 ENCOUNTER — Telehealth: Payer: Self-pay | Admitting: Internal Medicine

## 2023-08-03 DIAGNOSIS — Z79899 Other long term (current) drug therapy: Secondary | ICD-10-CM

## 2023-08-03 MED ORDER — TORSEMIDE 20 MG PO TABS: 20.0000 mg | ORAL_TABLET | Freq: Two times a day (BID) | ORAL | 0 refills | Status: DC

## 2023-08-03 MED ORDER — POTASSIUM CHLORIDE ER 10 MEQ PO TBCR: 10.0000 meq | EXTENDED_RELEASE_TABLET | Freq: Every day | ORAL | 3 refills | Status: AC

## 2023-08-03 MED ORDER — TORSEMIDE 20 MG PO TABS: 20.0000 mg | ORAL_TABLET | Freq: Every day | ORAL | 3 refills | Status: DC

## 2023-08-03 NOTE — Telephone Encounter (Signed)
 Spoke with daughter per DPR and she states she was confused about how to take torsemide. The prescription sent states 20 mg daily but she thought it was supposed to be 20 mg BID Please see Alden Server note from previous encounter below.  I did advise torsemide is 20 mg BID  Please advise patient to increase torsemide to 20 mg twice daily and add potassium 10 mEq daily as well.  Please encourage her to maintain the 64 ounces of water daily and continue elevating extremities when dependent and abstaining from excess salt.  She may also benefit from compression stockings to help with decreasing her extremity swelling.  Please order BMET to be completed in 1 week after increase of torsemide.  Please let me know if you have any further questions.   Thanks,  Robin Searing, NP

## 2023-08-03 NOTE — Telephone Encounter (Signed)
  Patients daughter is calling because they are confused about how much of her medications she is supposed to be taking. Especially of the torsemide (DEMADEX) 20 MG tablet  and potassium. Please call to advise regarding her dosages

## 2023-08-03 NOTE — Telephone Encounter (Signed)
 Placed order for medications and BMET.

## 2023-08-12 DIAGNOSIS — I5032 Chronic diastolic (congestive) heart failure: Secondary | ICD-10-CM | POA: Diagnosis not present

## 2023-08-12 DIAGNOSIS — E1165 Type 2 diabetes mellitus with hyperglycemia: Secondary | ICD-10-CM | POA: Diagnosis not present

## 2023-08-31 DIAGNOSIS — M1711 Unilateral primary osteoarthritis, right knee: Secondary | ICD-10-CM | POA: Diagnosis not present

## 2023-09-08 DIAGNOSIS — M1712 Unilateral primary osteoarthritis, left knee: Secondary | ICD-10-CM | POA: Diagnosis not present

## 2023-09-11 ENCOUNTER — Other Ambulatory Visit: Payer: Self-pay | Admitting: Internal Medicine

## 2023-09-29 DIAGNOSIS — E785 Hyperlipidemia, unspecified: Secondary | ICD-10-CM | POA: Diagnosis not present

## 2023-09-29 DIAGNOSIS — I10 Essential (primary) hypertension: Secondary | ICD-10-CM | POA: Diagnosis not present

## 2023-09-29 DIAGNOSIS — E1169 Type 2 diabetes mellitus with other specified complication: Secondary | ICD-10-CM | POA: Diagnosis not present

## 2023-09-29 DIAGNOSIS — J4540 Moderate persistent asthma, uncomplicated: Secondary | ICD-10-CM | POA: Diagnosis not present

## 2023-09-29 DIAGNOSIS — Z0000 Encounter for general adult medical examination without abnormal findings: Secondary | ICD-10-CM | POA: Diagnosis not present

## 2023-09-29 LAB — LAB REPORT - SCANNED: EGFR: 44

## 2023-10-01 ENCOUNTER — Telehealth: Payer: Self-pay | Admitting: Internal Medicine

## 2023-10-01 MED ORDER — ATENOLOL 100 MG PO TABS: 50.0000 mg | ORAL_TABLET | Freq: Every day | ORAL | Status: DC

## 2023-10-01 NOTE — Telephone Encounter (Signed)
 Returned call to patient's daughter.  She reports that pt was seen by PCP 2 days ago.  Labs drawn.  eGFR has decreased to 44 (CE).  BP is on low side - 107-103/60-65.  She is often fatigued and at times has been lightheaded.    PCP recommended that she call and ask about changing medications to help kidneys and help BP be not quite so low.    I clarified w her - torsemide  - she is taking 20 mg daily, current med list indicates BID.  She is aware I will forward to Dr. Paulita Boss and his nurse and we will call her back with recommendations.

## 2023-10-01 NOTE — Telephone Encounter (Signed)
 Daughter called back to correct previous note that patient's BP has been trending low and affecting patient's kidney function.

## 2023-10-01 NOTE — Telephone Encounter (Signed)
 Left message for Ray Caffey to callback.

## 2023-10-01 NOTE — Telephone Encounter (Signed)
 Patient's daughter Donna Garner returned call.   Shared recommendation from Dr. Paulita Boss: Seen in February of 2025 by my teammate, Renelda Carry. - at that time vital signs not assessed, no edema on exam by report; torsemide  was increased temporarily - Dr. Donia Furlough, through family, recommends decreasing some medication NOS - She has a history of rare, asymptomatic PVCs.  Reasonable to decrease BB in half for symptomatic hypotension and monitor for worsening palpitations (she will likely feel her heart rate go faster) - given last cardiology visit had concerns of elevated volume status, would defer decrease in diuretics until her f/u visit 10/15/2023.  Gloriann Larger, MD FASE [facility] Cardiologist [facility] [address] [place], [address] [place], [place] [address] [phone] 2:12 PM    Donna Garner verbalized understanding of the above and expressed appreciation for follow-up. Medication list updated with new dose change.

## 2023-10-01 NOTE — Telephone Encounter (Signed)
 Pt c/o medication issue:  1. Name of Medication: torsemide  (DEMADEX ) 20 MG tablet   2. How are you currently taking this medication (dosage and times per day)?    3. Are you having a reaction (difficulty breathing--STAT)? no  4. What is your medication issue? PCP would like for the dosage to be lower because of patient kidney function. Please advise

## 2023-10-08 DIAGNOSIS — I10 Essential (primary) hypertension: Secondary | ICD-10-CM | POA: Diagnosis not present

## 2023-10-13 DIAGNOSIS — M25561 Pain in right knee: Secondary | ICD-10-CM | POA: Diagnosis not present

## 2023-10-13 DIAGNOSIS — M5416 Radiculopathy, lumbar region: Secondary | ICD-10-CM | POA: Diagnosis not present

## 2023-10-14 NOTE — Progress Notes (Signed)
 Cardiology Office Note    Patient Name: Donna Garner Date of Encounter: 10/14/2023  Primary Care Provider:  Ransom Byers, MD Primary Cardiologist:  Jann Melody, MD Primary Electrophysiologist: None   Past Medical History    Past Medical History:  Diagnosis Date   Asthma    Cardiomegaly    CHF (congestive heart failure) (HCC)    Cough    Diabetes (HCC)    Dyspnea    History of swelling of feet    Hypertension    SOB (shortness of breath)    Swelling of ankle     History of Present Illness  Donna Garner  is a 78 year old female with a PMH of nonobstructive CAD, HFpEF, HTN, asthma, DM type II, bronchitis who presents today for 31-month follow-up.  Donna Garner was last seen on 07/17/2023 and had a chief complaint of chest pain that she described as sharp and located on the left breast.  She also describes shortness of breath and reported swelling in the legs for 1 week.  She had torsemide  increased to 20 mg twice daily x 3 days and then back to 20 mg daily.  She had EKG completed that showed no acute changes and was advised to use as needed nitroglycerin  if chest pain persisted to go to the ED.  Patient denies chest pain, palpitations, dyspnea, PND, orthopnea, nausea, vomiting, dizziness, syncope, edema, weight gain, or early satiety.   Discussed the use of AI scribe software for clinical note transcription with the patient, who gave verbal consent to proceed.  History of Present Illness    ***Notes: Patient had beta-blocker decreased with plan to DC previous diuretic today. -Last ischemic evaluation:  Review of Systems  Please see the history of present illness.    All other systems reviewed and are otherwise negative except as noted above.  Physical Exam    Wt Readings from Last 3 Encounters:  07/17/23 201 lb (91.2 kg)  06/11/23 201 lb (91.2 kg)  11/21/22 201 lb (91.2 kg)   WU:JWJXB were no vitals filed for this visit.,There is no height or weight on  file to calculate BMI. GEN: Well nourished, well developed in no acute distress Neck: No JVD; No carotid bruits Pulmonary: Clear to auscultation without rales, wheezing or rhonchi  Cardiovascular: Normal rate. Regular rhythm. Normal S1. Normal S2.   Murmurs: There is no murmur.  ABDOMEN: Soft, non-tender, non-distended EXTREMITIES:  No edema; No deformity   EKG/LABS/ Recent Cardiac Studies   ECG personally reviewed by me today - ***  Risk Assessment/Calculations:   {Does this patient have ATRIAL FIBRILLATION?:[id number]}  STOP-Bang Score:  3  { Consider Dx Sleep Disordered Breathing or Sleep Apnea  ICD G47.33          :1}    Lab Results  Component Value Date   WBC 9.5 08/18/2022   HGB 14.6 08/18/2022   HCT 44.6 08/18/2022   MCV 87.5 08/18/2022   PLT 208 08/18/2022   Lab Results  Component Value Date   CREATININE 1.04 (H) 07/24/2023   BUN 21 07/24/2023   NA 139 07/24/2023   K 3.7 07/24/2023   CL 98 07/24/2023   CO2 26 07/24/2023   No results found for: "CHOL", "HDL", "LDLCALC", "LDLDIRECT", "TRIG", "CHOLHDL"  No results found for: "HGBA1C" Assessment & Plan    1.HFpEF: -Patient's last 2D echo completed 11/2022 showing normal EF of 60 to 65% with no RWMA and grade 1 DD with severe LVH and  mildly elevated PASP of 38.5 mmHg -Today patient is  2.Nonobstructive CAD:   3.Essential hypertension: -Patient's blood pressure today is   4.DM type II: -Patient's last hemoglobin A1c was  5. History of PVCs:       Disposition: Follow-up with Jann Melody, MD or APP in *** months {Are you ordering a CV Procedure (e.g. stress test, cath, DCCV, TEE, etc)?   Press F2        :[id number]}   Signed, Francene Ing, Retha Cast, NP 10/14/2023, 10:56 AM [facility]

## 2023-10-15 ENCOUNTER — Encounter: Payer: Self-pay | Admitting: Nurse Practitioner

## 2023-10-15 ENCOUNTER — Ambulatory Visit

## 2023-10-15 ENCOUNTER — Ambulatory Visit: Payer: Medicare Other | Attending: Nurse Practitioner | Admitting: Nurse Practitioner

## 2023-10-15 VITALS — BP 124/80 | HR 83 | Ht 67.0 in | Wt 202.0 lb

## 2023-10-15 DIAGNOSIS — I493 Ventricular premature depolarization: Secondary | ICD-10-CM | POA: Diagnosis not present

## 2023-10-15 DIAGNOSIS — I5032 Chronic diastolic (congestive) heart failure: Secondary | ICD-10-CM | POA: Diagnosis not present

## 2023-10-15 DIAGNOSIS — I10 Essential (primary) hypertension: Secondary | ICD-10-CM | POA: Diagnosis not present

## 2023-10-15 DIAGNOSIS — Z79899 Other long term (current) drug therapy: Secondary | ICD-10-CM

## 2023-10-15 DIAGNOSIS — I2510 Atherosclerotic heart disease of native coronary artery without angina pectoris: Secondary | ICD-10-CM

## 2023-10-15 MED ORDER — TORSEMIDE 20 MG PO TABS: 20.0000 mg | ORAL_TABLET | Freq: Two times a day (BID) | ORAL | 0 refills | Status: DC

## 2023-10-15 MED ORDER — ATENOLOL 100 MG PO TABS: 100.0000 mg | ORAL_TABLET | Freq: Every day | ORAL | 3 refills | Status: AC

## 2023-10-15 NOTE — Progress Notes (Unsigned)
 Enrolled patient for a 14 day Zio XT monitor to be mailed to patients home  Chandrasekhar to read

## 2023-10-15 NOTE — Patient Instructions (Addendum)
 Medication Instructions:  Your physician has recommended you make the following change in your medication:   1) INCREASE atenolol  to 100 mg daily 2) You may take one extra tablet of torsemide  20 mg daily as needed for swelling in legs/feet or weight gain of 3 lbs overnight or 5 lbs in 1 week.  *If you need a refill on your cardiac medications before your next appointment, please call your pharmacy*  Testing/Procedures: Your physician has requested that you wear a Zio heart monitor for 14 days. This will be mailed to your home with instructions on how to apply the monitor and how to return it when finished. Please allow 2 weeks after returning the heart monitor before our office calls you with the results.   Follow-Up: At [facility], you and your health needs are our priority.  As part of our continuing mission to provide you with exceptional heart care, our providers are all part of one team.  This team includes your primary Cardiologist (physician) and Advanced Practice Providers or APPs (Physician Assistants and Nurse Practitioners) who all work together to provide you with the care you need, when you need it.  Your next appointment:   3 month(s)  The format for your next appointment:   In Person  Provider:   Charles Connor, NP   We recommend signing up for the patient portal called "MyChart".  Sign up information is provided on this After Visit Summary.  MyChart is used to connect with patients for Virtual Visits (Telemedicine).  Patients are able to view lab/test results, encounter notes, upcoming appointments, etc.  Non-urgent messages can be sent to your provider as well.   To learn more about what you can do with MyChart, go to [email or web address].   Other Instructions ZIO XT- Long Term Monitor Instructions     Your physician has requested you wear a ZIO patch monitor for 14 days.  This is a single patch monitor. [employer] supplies one patch monitor per enrollment.  Additional  stickers are not available. Please do not apply patch if you will be having a Nuclear Stress Test,  Echocardiogram, Cardiac CT, MRI, or Chest Xray during the period you would be wearing the  monitor. The patch cannot be worn during these tests. You cannot remove and re-apply the  ZIO XT patch monitor.  Your ZIO patch monitor will be mailed 3 day USPS to your address on file. It may take 3-5 days  to receive your monitor after you have been enrolled.  Once you have received your monitor, please review the enclosed instructions. Your monitor  has already been registered assigning a specific monitor serial # to you.     Billing and Patient Assistance Program Information     We have supplied [employer] with any of your insurance information on file for billing purposes.  [employer] offers a sliding scale Patient Assistance Program for patients that do not have  insurance, or whose insurance does not completely cover the cost of the ZIO monitor.  You must apply for the Patient Assistance Program to qualify for this discounted rate.  To apply, please call [employer] at [phone], select option 4, select option 2, ask to apply for  Patient Assistance Program. Sanna Crystal will ask your household income, and how many people  are in your household. They will quote your out-of-pocket cost based on that information.  [employer] will also be able to set up a 35-month, interest-free payment plan if needed.     Applying the  monitor     Shave hair from upper left chest.  Hold abrader disc by orange tab. Rub abrader in 40 strokes over the upper left chest as  indicated in your monitor instructions.  Clean area with 4 enclosed alcohol pads. Let dry.  Apply patch as indicated in monitor instructions. Patch will be placed under collarbone on left  side of chest with arrow pointing upward.  Rub patch adhesive wings for 2 minutes. Remove white label marked "1". Remove the white  label marked "2". Rub patch  adhesive wings for 2 additional minutes.  While looking in a mirror, press and release button in center of patch. A small green light will  flash 3-4 times. This will be your only indicator that the monitor has been turned on.  Do not shower for the first 24 hours. You may shower after the first 24 hours.  Press the button if you feel a symptom. You will hear a small click. Record Date, Time and  Symptom in the Patient Logbook.  When you are ready to remove the patch, follow instructions on the last 2 pages of Patient  Logbook. Stick patch monitor onto the last page of Patient Logbook.  Place Patient Logbook in the blue and white box. Use locking tab on box and tape box closed  securely. The blue and white box has prepaid postage on it. Please place it in the mailbox as  soon as possible. Your physician should have your test results approximately 7 days after the  monitor has been mailed back to [facility].  Call [facility] Customer Care at [phone] if you have questions regarding  your ZIO XT patch monitor. Call them immediately if you see an orange light blinking on your  monitor.  If your monitor falls off in less than 4 days, contact our Monitor department at [phone].  If your monitor becomes loose or falls off after 4 days call [employer] at [phone] for  suggestions on securing your monitor.

## 2023-11-05 DIAGNOSIS — M5416 Radiculopathy, lumbar region: Secondary | ICD-10-CM | POA: Diagnosis not present

## 2023-11-12 DIAGNOSIS — I493 Ventricular premature depolarization: Secondary | ICD-10-CM | POA: Diagnosis not present

## 2023-11-19 ENCOUNTER — Other Ambulatory Visit: Payer: Self-pay | Admitting: Internal Medicine

## 2023-11-19 DIAGNOSIS — I493 Ventricular premature depolarization: Secondary | ICD-10-CM

## 2023-11-19 DIAGNOSIS — I10 Essential (primary) hypertension: Secondary | ICD-10-CM

## 2023-11-20 ENCOUNTER — Ambulatory Visit: Payer: Self-pay | Admitting: Nurse Practitioner

## 2023-12-08 DIAGNOSIS — E119 Type 2 diabetes mellitus without complications: Secondary | ICD-10-CM | POA: Diagnosis not present

## 2023-12-09 ENCOUNTER — Ambulatory Visit (INDEPENDENT_AMBULATORY_CARE_PROVIDER_SITE_OTHER): Payer: Medicare Other | Admitting: Otolaryngology

## 2023-12-14 DIAGNOSIS — M1711 Unilateral primary osteoarthritis, right knee: Secondary | ICD-10-CM | POA: Diagnosis not present

## 2023-12-21 DIAGNOSIS — M1711 Unilateral primary osteoarthritis, right knee: Secondary | ICD-10-CM | POA: Diagnosis not present

## 2023-12-28 DIAGNOSIS — M1711 Unilateral primary osteoarthritis, right knee: Secondary | ICD-10-CM | POA: Diagnosis not present

## 2024-01-08 DIAGNOSIS — E119 Type 2 diabetes mellitus without complications: Secondary | ICD-10-CM | POA: Diagnosis not present

## 2024-01-10 ENCOUNTER — Other Ambulatory Visit: Payer: Self-pay | Admitting: Nurse Practitioner

## 2024-01-10 DIAGNOSIS — Z79899 Other long term (current) drug therapy: Secondary | ICD-10-CM

## 2024-01-13 ENCOUNTER — Telehealth: Payer: Self-pay | Admitting: Internal Medicine

## 2024-01-13 ENCOUNTER — Telehealth: Payer: Self-pay | Admitting: Nurse Practitioner

## 2024-01-13 DIAGNOSIS — Z79899 Other long term (current) drug therapy: Secondary | ICD-10-CM

## 2024-01-13 NOTE — Telephone Encounter (Signed)
 Pt c/o medication issue:  1. Name of Medication:  torsemide  (DEMADEX ) 20 MG tablet   2. How are you currently taking this medication (dosage and times per day)?   3. Are you having a reaction (difficulty breathing--STAT)?   4. What is your medication issue?   Patient's daughter would like to clarify instructions. Please advise.

## 2024-01-13 NOTE — Telephone Encounter (Signed)
*  STAT* If patient is at the pharmacy, call can be transferred to refill team.   1. Which medications need to be refilled? (please list name of each medication and dose if known) torsemide  (DEMADEX ) 20 MG tablet   4. Which pharmacy/location (including street and city if local pharmacy) is medication to be sent to? [id number] - [place], [place] - [address] AT [facility] OF [address]     5. Do they need a 30 day or 90 day supply? [id number]  Pharmacy states they had a power outage yesterday and this refill could have been messed up, they ask that someone send over again

## 2024-01-13 NOTE — Telephone Encounter (Signed)
 Called daughter ok per DPR.  Advised pt is to take Torsemide  20 mg PO BID with an additional dose daily as needed for swelling/ wt gain.   Daughter reports pt was advised to take Torsemide  20 mg every day with an as needed dose; d/t low BP readings.   Pt has taken Torsemide  20 mg with additional dose PRN.  BP ranges 117/70-120-80.  Pt takes PRN dose without difficulty. No BP or swelling issues taking once daily dosing.  Advised will send to provider to advise.

## 2024-01-14 ENCOUNTER — Other Ambulatory Visit: Payer: Self-pay | Admitting: Family Medicine

## 2024-01-14 DIAGNOSIS — Z1231 Encounter for screening mammogram for malignant neoplasm of breast: Secondary | ICD-10-CM

## 2024-01-14 DIAGNOSIS — I10 Essential (primary) hypertension: Secondary | ICD-10-CM | POA: Diagnosis not present

## 2024-01-14 DIAGNOSIS — E1165 Type 2 diabetes mellitus with hyperglycemia: Secondary | ICD-10-CM | POA: Diagnosis not present

## 2024-01-14 DIAGNOSIS — E785 Hyperlipidemia, unspecified: Secondary | ICD-10-CM | POA: Diagnosis not present

## 2024-01-14 MED ORDER — TORSEMIDE 20 MG PO TABS: 20.0000 mg | ORAL_TABLET | Freq: Every day | ORAL | 2 refills | Status: AC

## 2024-01-14 NOTE — Telephone Encounter (Signed)
 Called pt daughter ok per DPR advised of Providers response:  Covering for Donna Garner, please instruct his daughter that he may continue to take torsemide  20 mg daily and take additional as needed dosing for lower extremity swelling/weight gain.  Blood pressures are well-controlled.  Thank you.   Josefa HERO. Cleaver NP-C  Daughter expresses understanding updated script sent to pharmacy.

## 2024-01-27 NOTE — Progress Notes (Signed)
 Cardiology Office Note    Patient Name: Donna Garner Date of Encounter: 01/27/2024  Primary Care Provider:  Chrystal Lamarr GORMAN, MD Primary Cardiologist:  Stanly DELENA Leavens, MD Primary Electrophysiologist: None   Past Medical History    Past Medical History:  Diagnosis Date   Asthma    Cardiomegaly    CHF (congestive heart failure) (HCC)    Cough    Diabetes (HCC)    Dyspnea    History of swelling of feet    Hypertension    SOB (shortness of breath)    Swelling of ankle     History of Present Illness  Donna Garner  is a 78 year old female with a PMH of nonobstructive CAD, HFpEF, HTN, asthma, DM type II, bronchitis who presents today for 40-month follow-up.   Ms. Rippetoe was last seen on 10/15/2023 and during visit reported improvement to swelling and increased heart rate and shortness of breath.  She was noted to be euvolemic on exam to continue current regimen with valsartan  and torsemide .  She also noted occasional chest discomfort and was advised to continue to monitor and use as needed nitroglycerin  with plan to go to the ED if discomfort persisted.  She also underwent 14-day ZIO monitor to evaluate PVC burden that showed predominantly sinus rhythm with no abnormal rhythm or increased episodes of PVCs.   Patient denies chest pain, palpitations, dyspnea, PND, orthopnea, nausea, vomiting, dizziness, syncope, edema, weight gain, or early satiety.   Discussed the use of AI scribe software for clinical note transcription with the patient, who gave verbal consent to proceed.  History of Present Illness    ***Notes:   Review of Systems  Please see the history of present illness.    All other systems reviewed and are otherwise negative except as noted above.  Physical Exam    Wt Readings from Last 3 Encounters:  10/15/23 202 lb (91.6 kg)  07/17/23 201 lb (91.2 kg)  06/11/23 201 lb (91.2 kg)   CD:Uyzmz were no vitals filed for this visit.,There is no height or  weight on file to calculate BMI. GEN: Well nourished, well developed in no acute distress Neck: No JVD; No carotid bruits Pulmonary: Clear to auscultation without rales, wheezing or rhonchi  Cardiovascular: Normal rate. Regular rhythm. Normal S1. Normal S2.   Murmurs: There is no murmur.  ABDOMEN: Soft, non-tender, non-distended EXTREMITIES:  No edema; No deformity   EKG/LABS/ Recent Cardiac Studies   ECG personally reviewed by me today - ***  Risk Assessment/Calculations:   {Does this patient have ATRIAL FIBRILLATION?:[phone]}  STOP-Bang Score:     { Consider Dx Sleep Disordered Breathing or Sleep Apnea  ICD G47.33          :1}    Lab Results  Component Value Date   WBC 9.5 08/18/2022   HGB 14.6 08/18/2022   HCT 44.6 08/18/2022   MCV 87.5 08/18/2022   PLT 208 08/18/2022   Lab Results  Component Value Date   CREATININE 1.04 (H) 07/24/2023   BUN 21 07/24/2023   NA 139 07/24/2023   K 3.7 07/24/2023   CL 98 07/24/2023   CO2 26 07/24/2023   No results found for: CHOL, HDL, LDLCALC, LDLDIRECT, TRIG, CHOLHDL  No results found for: HGBA1C Assessment & Plan    Assessment and Plan Assessment & Plan   1.HFpEF: -Patient's last 2D echo completed 11/2022 showing normal EF of 60 to 65% with no RWMA and grade 1 DD with severe  LVH and mildly elevated PASP of 38.5 mmHg -Today patient is*** - Continue Farxiga 10 mg, spironolactone  50 mg, torsemide  20 mg with as needed 20 mg, valsartan  160 mg and potassium 10 mEq daily. -Low sodium diet, fluid restriction <2L, and daily weights encouraged. Educated to contact our office for weight gain of 2 lbs overnight or 5 lbs in one week.    2.Nonobstructive CAD:  -Previous Lexiscan  Myoview  with no evidence of ischemia noted   3.Essential hypertension: -Patient's blood pressure today is***- Continue valsartan  160 mg daily, spironolactone  50 mg, atenolol  100 mg daily   4.DM type II: -Patient's last hemoglobin A1c was Discussed  dietary management and moderation of high-carbohydrate foods. - Continue current diabetes management plan. - Advise moderation in high-carbohydrate foods.   5. History of PVCs:  -14-day ZIO monitor worn that showed no evidence of increased PVC burden and predominantly sinus rhythm   6. Hypotension       Disposition: Follow-up with Stanly DELENA Leavens, MD or APP in *** months {Are you ordering a CV Procedure (e.g. stress test, cath, DCCV, TEE, etc)?   Press F2        :[id number]}   Signed, Wyn Raddle, Jackee Shove, NP 01/27/2024, 5:37 PM [facility]

## 2024-01-28 ENCOUNTER — Ambulatory Visit: Attending: Nurse Practitioner | Admitting: Nurse Practitioner

## 2024-01-28 ENCOUNTER — Encounter: Payer: Self-pay | Admitting: Nurse Practitioner

## 2024-01-28 VITALS — BP 106/58 | HR 67 | Ht 67.0 in | Wt 204.6 lb

## 2024-01-28 DIAGNOSIS — E119 Type 2 diabetes mellitus without complications: Secondary | ICD-10-CM

## 2024-01-28 DIAGNOSIS — I10 Essential (primary) hypertension: Secondary | ICD-10-CM

## 2024-01-28 DIAGNOSIS — I5032 Chronic diastolic (congestive) heart failure: Secondary | ICD-10-CM

## 2024-01-28 DIAGNOSIS — I493 Ventricular premature depolarization: Secondary | ICD-10-CM

## 2024-01-28 DIAGNOSIS — I2510 Atherosclerotic heart disease of native coronary artery without angina pectoris: Secondary | ICD-10-CM | POA: Diagnosis not present

## 2024-01-28 NOTE — Patient Instructions (Signed)
 Medication Instructions:  Get some Voltaren gel for your arthritis *If you need a refill on your cardiac medications before your next appointment, please call your pharmacy*  Lab Work: None ordered If you have labs (blood work) drawn today and your tests are completely normal, you will receive your results only by: MyChart Message (if you have MyChart) OR A paper copy in the mail If you have any lab test that is abnormal or we need to change your treatment, we will call you to review the results.  Testing/Procedures: None ordered  Follow-Up: At [facility], you and your health needs are our priority.  As part of our continuing mission to provide you with exceptional heart care, our providers are all part of one team.  This team includes your primary Cardiologist (physician) and Advanced Practice Providers or APPs (Physician Assistants and Nurse Practitioners) who all work together to provide you with the care you need, when you need it.  Your next appointment:   6 month(s)  Provider:   Stanly DELENA Leavens, MD    We recommend signing up for the patient portal called MyChart.  Sign up information is provided on this After Visit Summary.  MyChart is used to connect with patients for Virtual Visits (Telemedicine).  Patients are able to view lab/test results, encounter notes, upcoming appointments, etc.  Non-urgent messages can be sent to your provider as well.   To learn more about what you can do with MyChart, go to [email or web address].      Other Instructions [facility] PHONE: [phone]

## 2024-02-08 DIAGNOSIS — E119 Type 2 diabetes mellitus without complications: Secondary | ICD-10-CM | POA: Diagnosis not present

## 2024-02-16 DIAGNOSIS — M48062 Spinal stenosis, lumbar region with neurogenic claudication: Secondary | ICD-10-CM | POA: Diagnosis not present

## 2024-02-16 DIAGNOSIS — Z5181 Encounter for therapeutic drug level monitoring: Secondary | ICD-10-CM | POA: Diagnosis not present

## 2024-02-16 DIAGNOSIS — M5451 Vertebrogenic low back pain: Secondary | ICD-10-CM | POA: Diagnosis not present

## 2024-02-16 DIAGNOSIS — Z79899 Other long term (current) drug therapy: Secondary | ICD-10-CM | POA: Diagnosis not present

## 2024-02-17 ENCOUNTER — Ambulatory Visit
Admission: RE | Admit: 2024-02-17 | Discharge: 2024-02-17 | Disposition: A | Discharge status: Home / Self Care | Source: Ambulatory Visit | Attending: Family Medicine

## 2024-02-17 DIAGNOSIS — Z1231 Encounter for screening mammogram for malignant neoplasm of breast: Secondary | ICD-10-CM | POA: Diagnosis not present

## 2024-02-20 DIAGNOSIS — M5416 Radiculopathy, lumbar region: Secondary | ICD-10-CM | POA: Diagnosis not present

## 2024-03-09 DIAGNOSIS — E119 Type 2 diabetes mellitus without complications: Secondary | ICD-10-CM | POA: Diagnosis not present

## 2024-04-09 DIAGNOSIS — E119 Type 2 diabetes mellitus without complications: Secondary | ICD-10-CM | POA: Diagnosis not present

## 2024-04-18 DIAGNOSIS — J4540 Moderate persistent asthma, uncomplicated: Secondary | ICD-10-CM | POA: Diagnosis not present

## 2024-04-18 DIAGNOSIS — E6609 Other obesity due to excess calories: Secondary | ICD-10-CM | POA: Diagnosis not present

## 2024-04-18 DIAGNOSIS — E1165 Type 2 diabetes mellitus with hyperglycemia: Secondary | ICD-10-CM | POA: Diagnosis not present

## 2024-04-18 DIAGNOSIS — E66811 Obesity, class 1: Secondary | ICD-10-CM | POA: Diagnosis not present

## 2024-05-14 DIAGNOSIS — M5416 Radiculopathy, lumbar region: Secondary | ICD-10-CM | POA: Diagnosis not present

## 2024-09-05 ENCOUNTER — Ambulatory Visit: Admitting: Internal Medicine
